# Patient Record
Sex: Female | Born: 1935 | Race: White | Hispanic: No | Smoking: Never smoker
Health system: Southern US, Community
[De-identification: ages and names within clinical notes are randomized; demographics above are authoritative.]

## PROBLEM LIST (undated history)

## (undated) DIAGNOSIS — D472 Monoclonal gammopathy: Secondary | ICD-10-CM

## (undated) DIAGNOSIS — I5032 Chronic diastolic (congestive) heart failure: Secondary | ICD-10-CM

## (undated) DIAGNOSIS — I2510 Atherosclerotic heart disease of native coronary artery without angina pectoris: Secondary | ICD-10-CM

## (undated) DIAGNOSIS — M1990 Unspecified osteoarthritis, unspecified site: Secondary | ICD-10-CM

## (undated) DIAGNOSIS — C8580 Other specified types of non-Hodgkin lymphoma, unspecified site: Secondary | ICD-10-CM

## (undated) DIAGNOSIS — I4821 Permanent atrial fibrillation: Secondary | ICD-10-CM

## (undated) DIAGNOSIS — I7090 Unspecified atherosclerosis: Secondary | ICD-10-CM

## (undated) DIAGNOSIS — C8300 Small cell B-cell lymphoma, unspecified site: Secondary | ICD-10-CM

## (undated) DIAGNOSIS — I10 Essential (primary) hypertension: Secondary | ICD-10-CM

## (undated) DIAGNOSIS — E785 Hyperlipidemia, unspecified: Secondary | ICD-10-CM

## (undated) DIAGNOSIS — N183 Chronic kidney disease, stage 3 unspecified: Secondary | ICD-10-CM

## (undated) DIAGNOSIS — M353 Polymyalgia rheumatica: Secondary | ICD-10-CM

## (undated) DIAGNOSIS — I509 Heart failure, unspecified: Secondary | ICD-10-CM

## (undated) DIAGNOSIS — N184 Chronic kidney disease, stage 4 (severe): Secondary | ICD-10-CM

## (undated) DIAGNOSIS — M109 Gout, unspecified: Secondary | ICD-10-CM

## (undated) DIAGNOSIS — L570 Actinic keratosis: Secondary | ICD-10-CM

## (undated) DIAGNOSIS — C4492 Squamous cell carcinoma of skin, unspecified: Secondary | ICD-10-CM

## (undated) DIAGNOSIS — F0390 Unspecified dementia without behavioral disturbance: Secondary | ICD-10-CM

## (undated) DIAGNOSIS — I4891 Unspecified atrial fibrillation: Secondary | ICD-10-CM

## (undated) DIAGNOSIS — R0600 Dyspnea, unspecified: Secondary | ICD-10-CM

## (undated) DIAGNOSIS — E039 Hypothyroidism, unspecified: Secondary | ICD-10-CM

## (undated) HISTORY — PX: CARDIAC CATHETERIZATION: SHX172

## (undated) HISTORY — DX: Actinic keratosis: L57.0

## (undated) HISTORY — DX: Squamous cell carcinoma of skin, unspecified: C44.92

## (undated) HISTORY — PX: COLONOSCOPY: SHX174

## (undated) HISTORY — PX: REPLACEMENT TOTAL KNEE: SUR1224

## (undated) HISTORY — PX: TUBAL LIGATION: SHX77

---

## 1976-09-22 HISTORY — PX: CHOLECYSTECTOMY: SHX55

## 2006-04-08 ENCOUNTER — Ambulatory Visit: Payer: Self-pay | Admitting: Internal Medicine

## 2006-04-20 ENCOUNTER — Inpatient Hospital Stay: Payer: Self-pay | Admitting: General Practice

## 2006-05-12 ENCOUNTER — Encounter: Payer: Self-pay | Admitting: General Practice

## 2006-05-23 ENCOUNTER — Encounter: Payer: Self-pay | Admitting: General Practice

## 2008-06-08 ENCOUNTER — Ambulatory Visit: Payer: Self-pay | Admitting: Internal Medicine

## 2008-06-14 ENCOUNTER — Ambulatory Visit: Payer: Self-pay | Admitting: Physician Assistant

## 2009-11-16 IMAGING — CR RIGHT TIBIA AND FIBULA - 2 VIEW
1 series · 2 of 2 positions shown · non-contrast
Comparison: none

REASON FOR EXAM: pain s/p fall
COMMENTS:

PROCEDURE:     MDR - MDR TIBIA AND FIBULA RT-LOW LEG  - June 08, 2008  [DATE]
RESULT:     The patient has a fracture of the lateral tibial plateau as
noted on the knee radiographs. No additional fractures of the RIGHT lower
leg are seen. No radiodense soft tissue foreign body is observed.

[Series 1: view not recorded · 0.17mm/px · 2 of 2 slices shown]
[im 1/2]
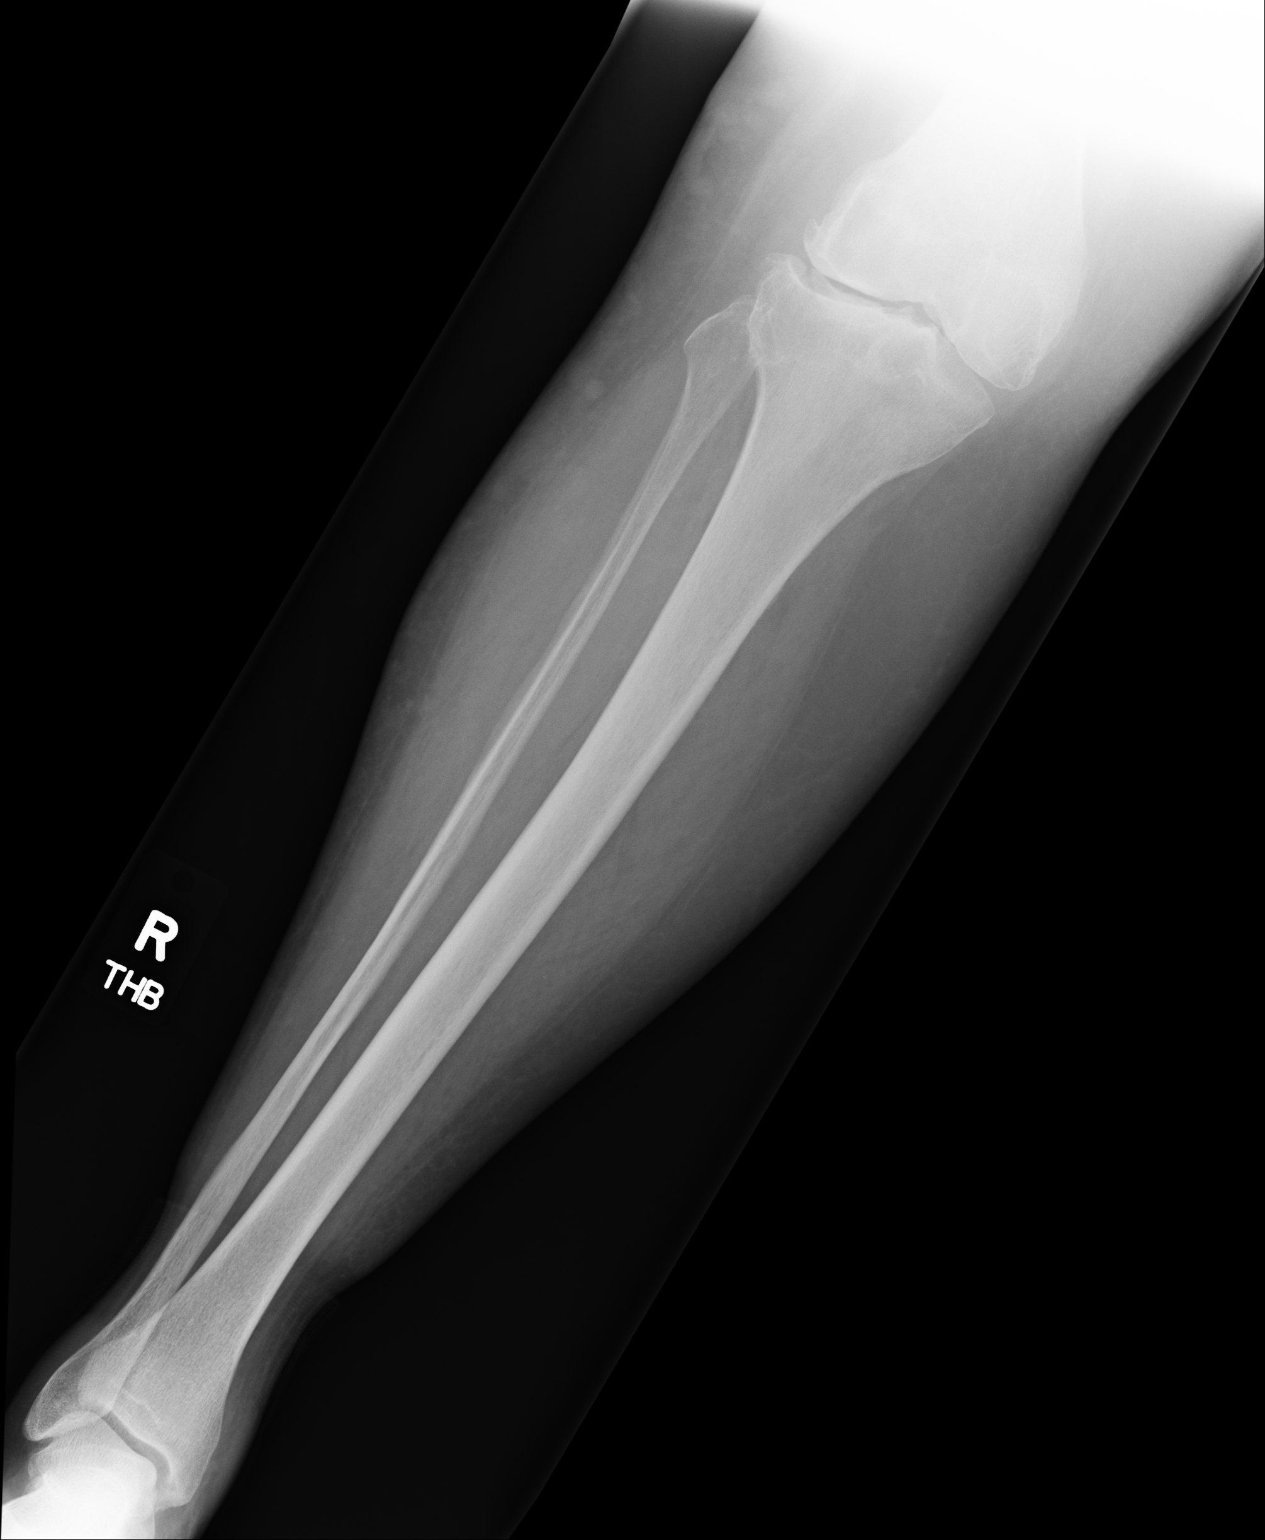
[im 2/2]
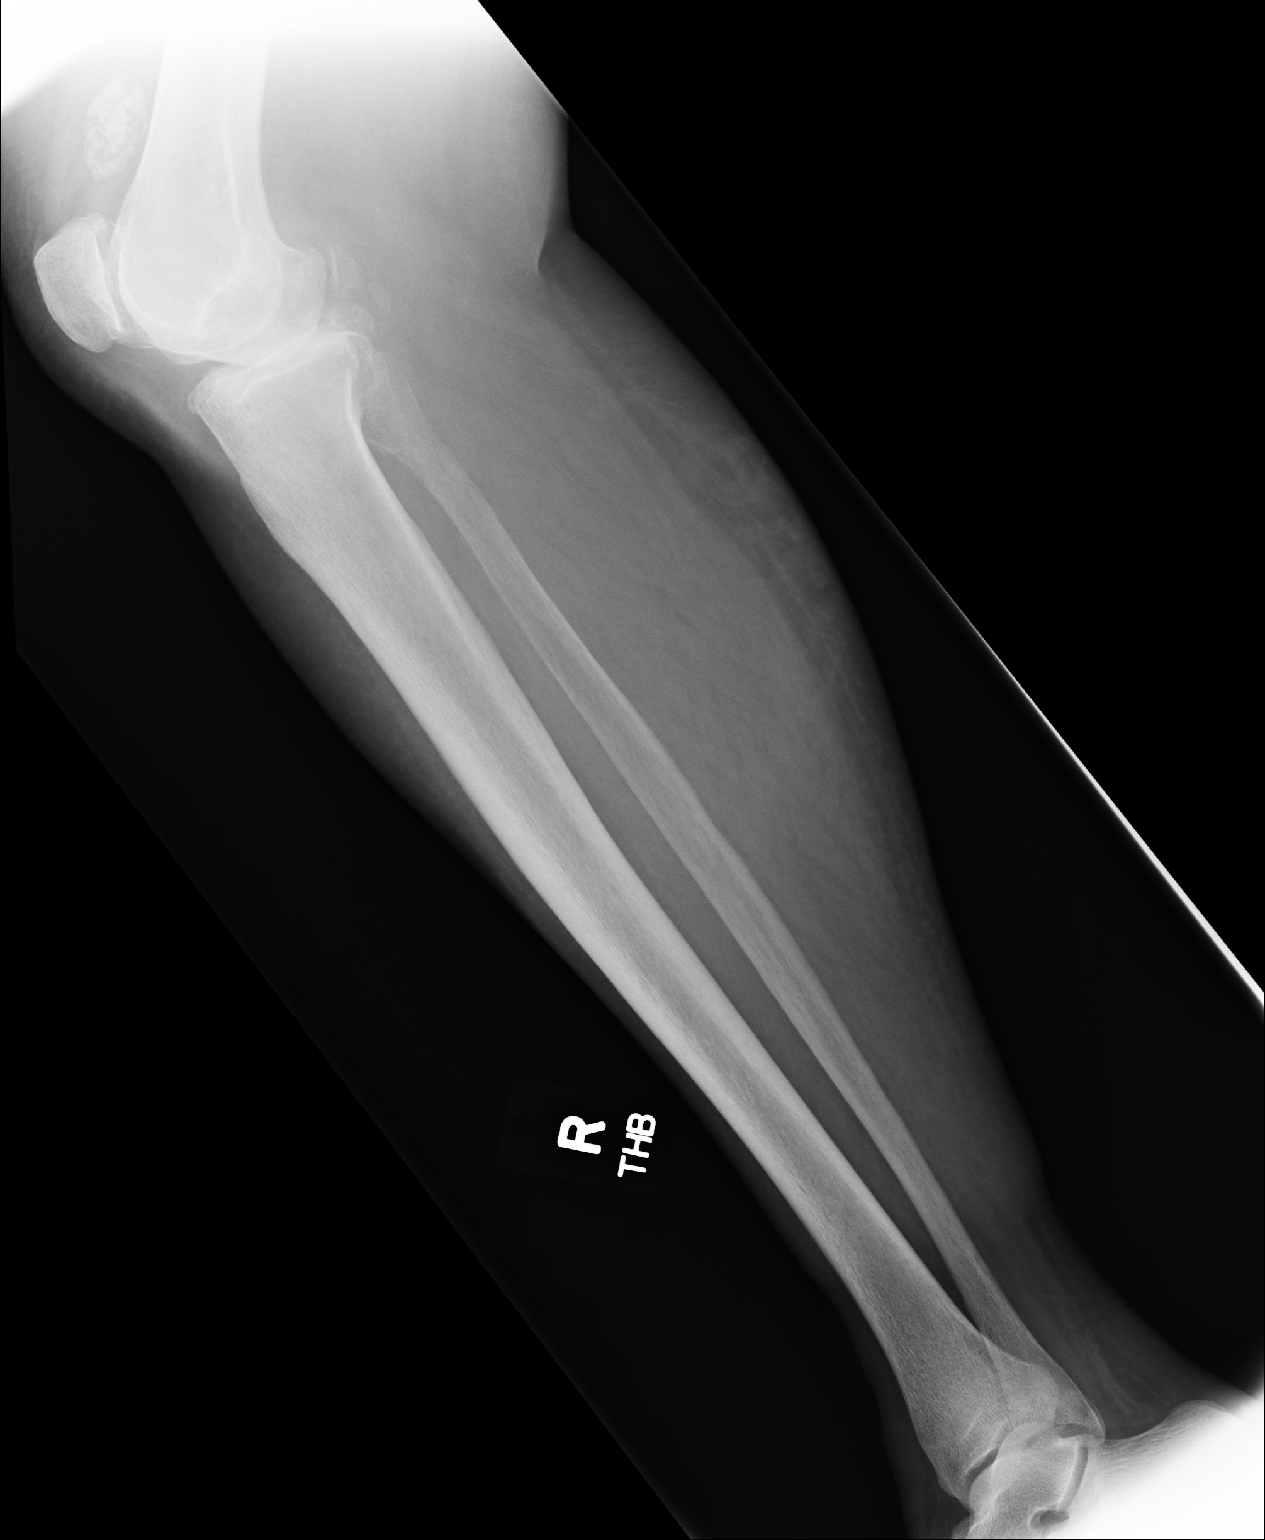

[2 of 2 positions shown; findings below may reference images not displayed]

IMPRESSION: 1.     Fracture of the lateral tibial plateau.

## 2009-11-16 IMAGING — CR DG KNEE COMPLETE 4+V*R*
1 series · 4 of 4 positions shown · non-contrast
Comparison: none

REASON FOR EXAM: pain s/p fall
COMMENTS:

[Series 1: view not recorded · 0.17mm/px · 4 of 4 slices shown]
[im 1/4]
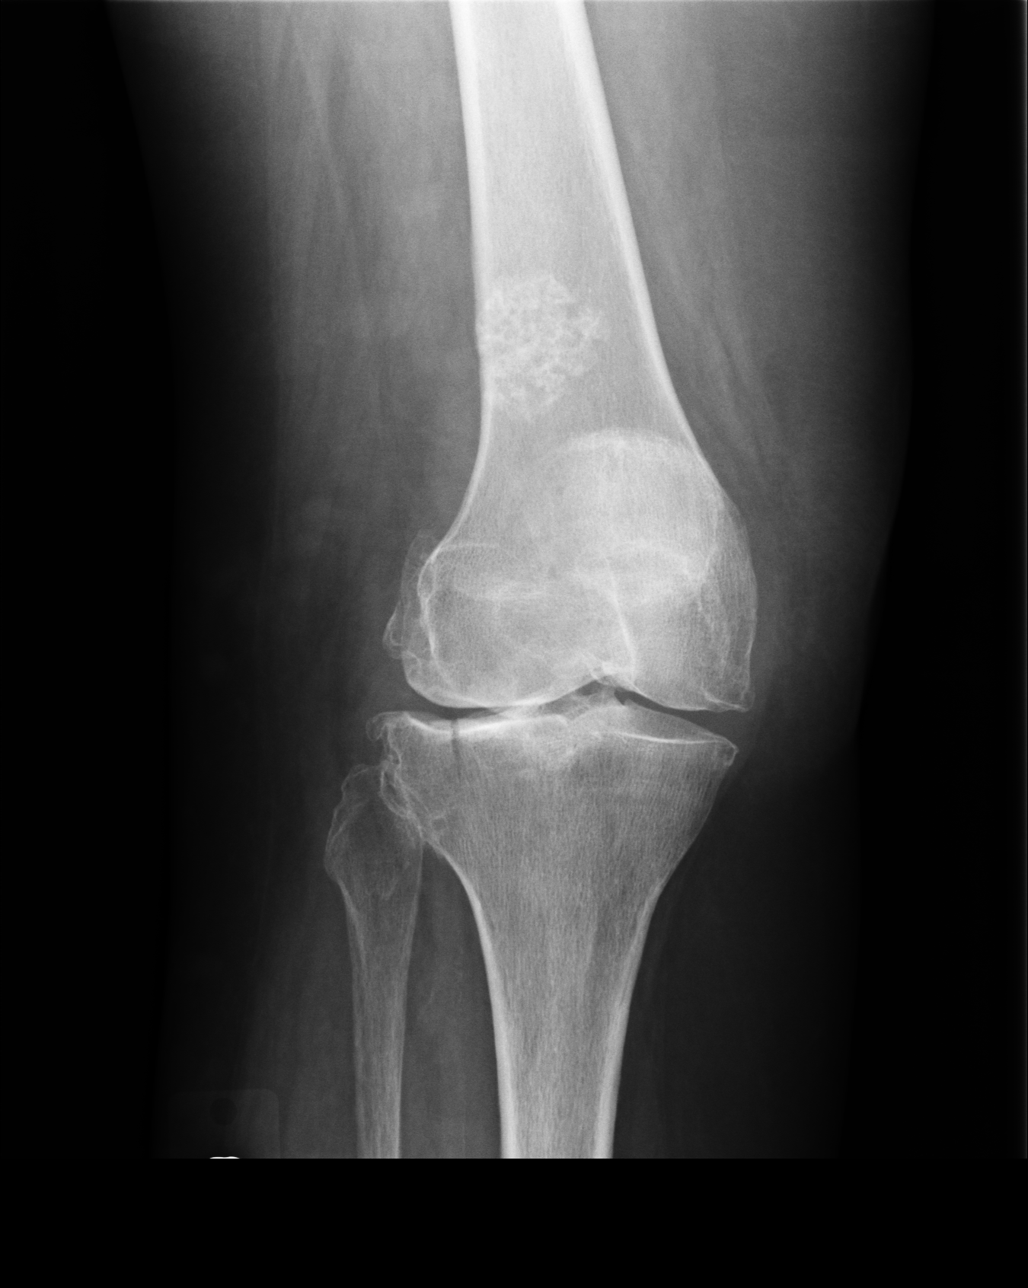
[im 2/4]
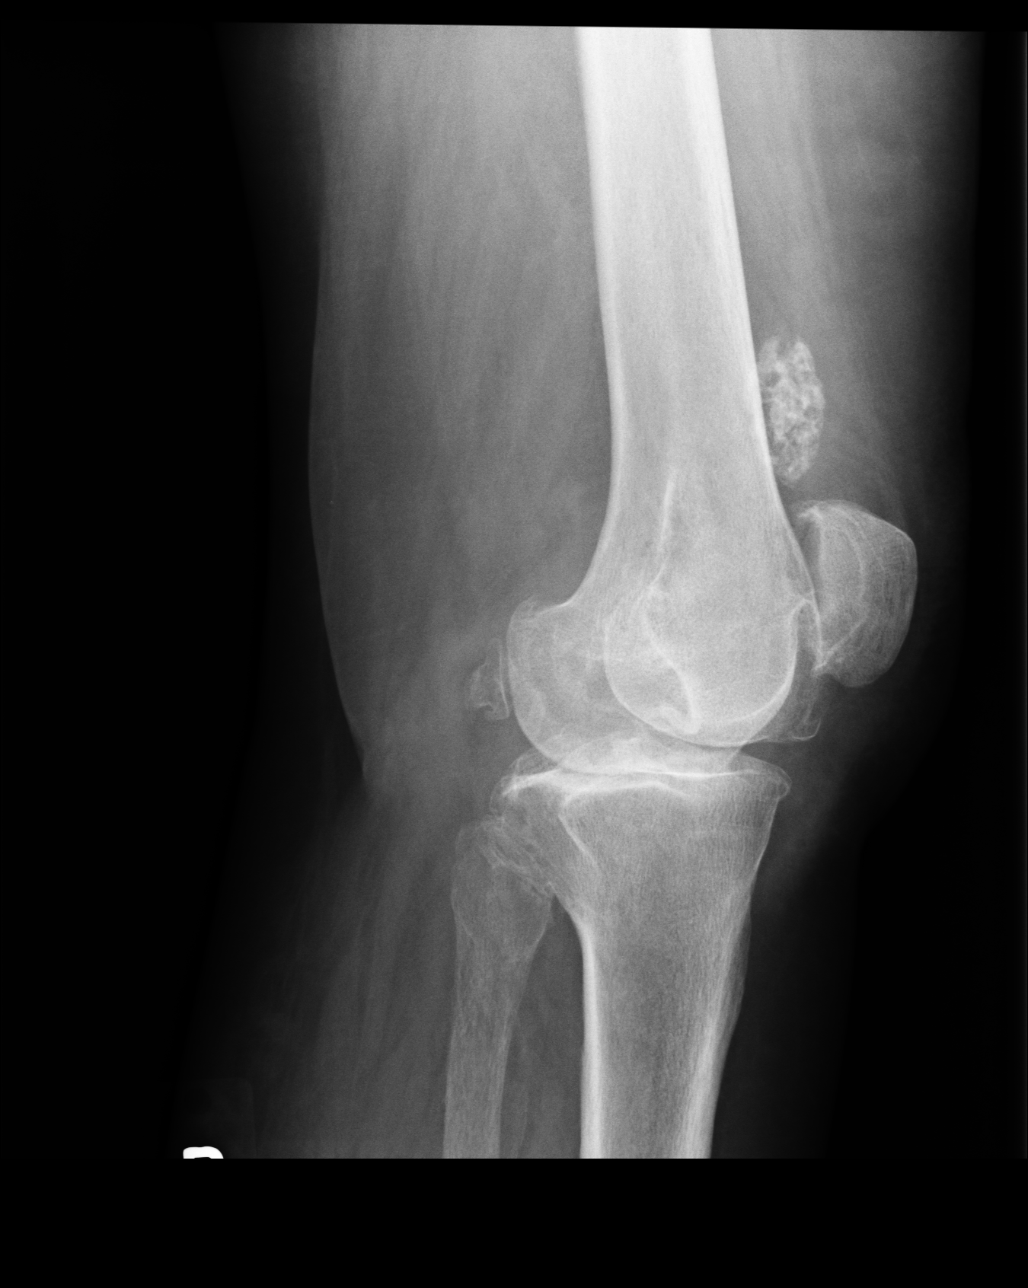
[im 3/4]
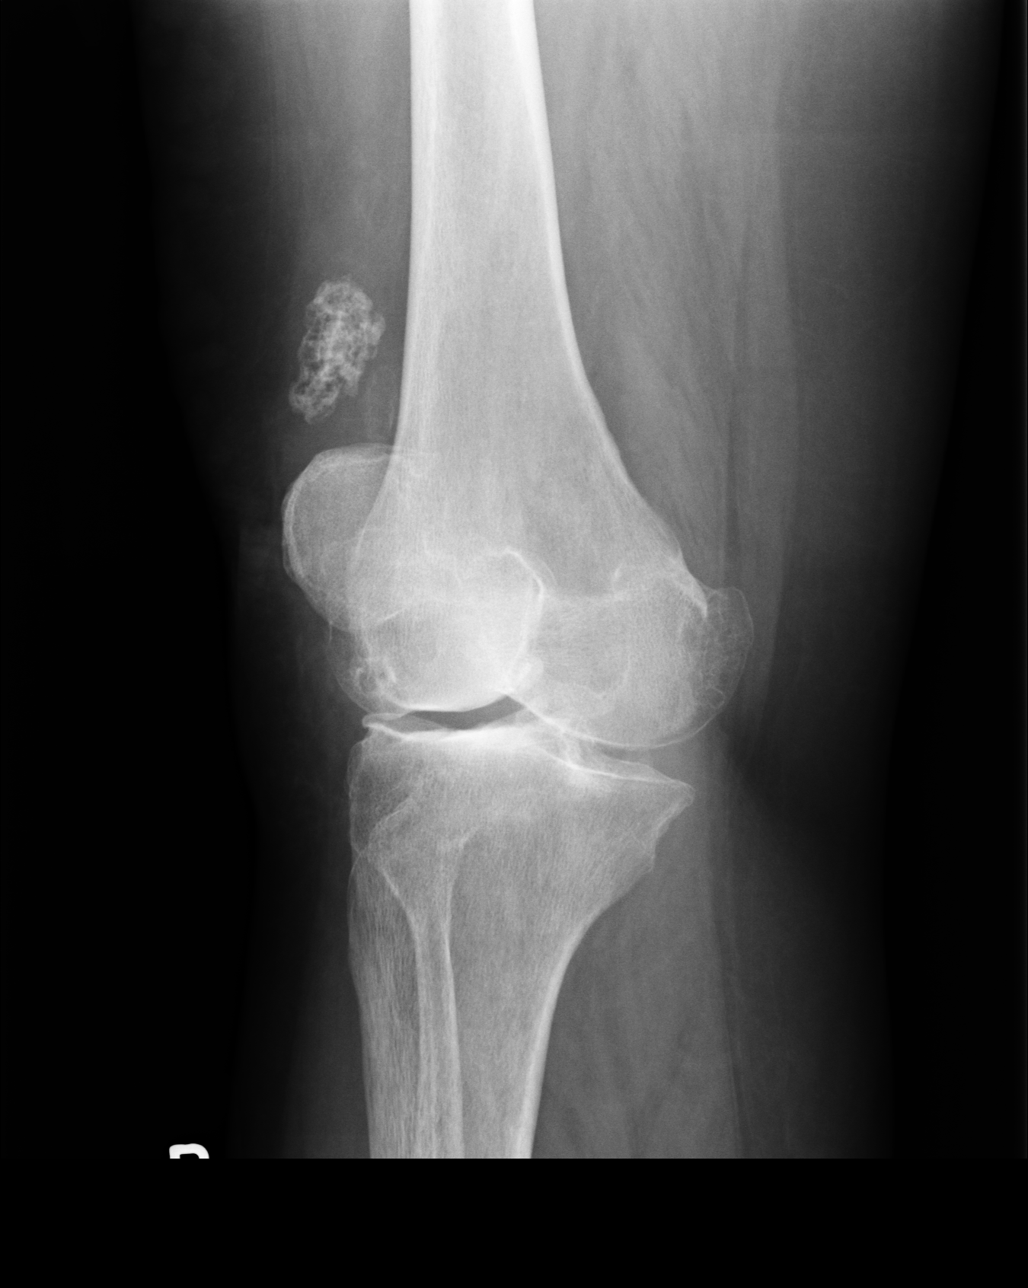
[im 4/4]
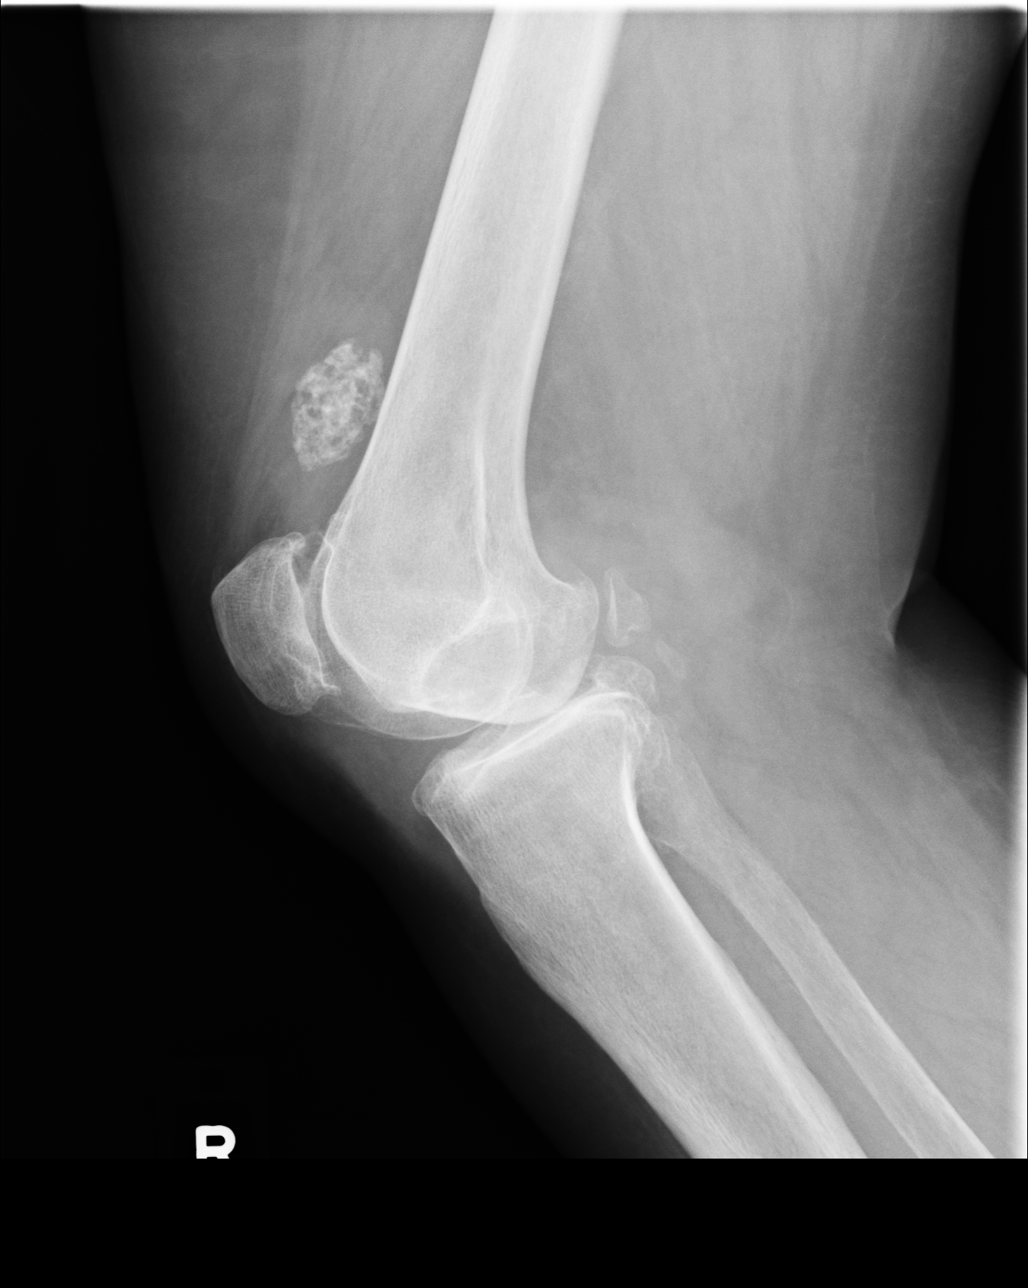

[4 of 4 positions shown; findings below may reference images not displayed]

PROCEDURE:     MDR - MDR KNEE RT COMPLETE W/OBLIQUES  - June 08, 2008  [DATE]

RESULT:     There is a minimally displaced vertical fracture involving the
lateral tibial plateau. No step-off along the articular plate is currently
seen. The fracture line is opened approximately 2 mm. On plain film
examination no definite depression is observed but this could be better
evaluated by CT if clinically indicated. No other fractures about the knee
are seen. Degenerative spurring is noted medially and laterally. Dorsal
patellar spurring is noted in the lateral view. There is noted calcification
superior to the patella and likely representing soft tissue calcification
from prior trauma.
IMPRESSION: 1. There is a fracture of the lateral tibial plateau.
2. Degenerative spurring is noted at the knee medially and laterally.
3. Dorsal patellar spurring is noted.
4. There is soft tissue calcification anterior to the distal shaft of the
femur. The etiology for this is not certain but is thought to likely
represent calcification secondary to residual change from prior trauma.
Synovial calcification could also produce this appearance but the
calcification appears somewhat high in position for synovial calcifications.

## 2010-12-27 ENCOUNTER — Ambulatory Visit: Payer: Self-pay | Admitting: Internal Medicine

## 2012-05-07 DIAGNOSIS — D472 Monoclonal gammopathy: Secondary | ICD-10-CM | POA: Insufficient documentation

## 2013-05-16 ENCOUNTER — Ambulatory Visit: Payer: Self-pay | Admitting: General Practice

## 2013-05-16 LAB — BASIC METABOLIC PANEL
BUN: 25 mg/dL — ABNORMAL HIGH (ref 7–18)
Calcium, Total: 9.4 mg/dL (ref 8.5–10.1)
Chloride: 105 mmol/L (ref 98–107)
Co2: 28 mmol/L (ref 21–32)
Creatinine: 1.15 mg/dL (ref 0.60–1.30)
EGFR (African American): 54 — ABNORMAL LOW
EGFR (Non-African Amer.): 46 — ABNORMAL LOW
Osmolality: 285 (ref 275–301)
Potassium: 3.2 mmol/L — ABNORMAL LOW (ref 3.5–5.1)
Sodium: 139 mmol/L (ref 136–145)

## 2013-05-16 LAB — CBC
HCT: 34.6 % — ABNORMAL LOW (ref 35.0–47.0)
MCHC: 33.5 g/dL (ref 32.0–36.0)
MCV: 91 fL (ref 80–100)
Platelet: 232 10*3/uL (ref 150–440)

## 2013-05-16 LAB — SEDIMENTATION RATE: Erythrocyte Sed Rate: 53 mm/hr — ABNORMAL HIGH (ref 0–30)

## 2013-05-16 LAB — URINALYSIS, COMPLETE
Bacteria: NONE SEEN
Leukocyte Esterase: NEGATIVE
Nitrite: NEGATIVE
Protein: NEGATIVE
WBC UR: 2 /HPF (ref 0–5)

## 2013-05-16 LAB — PROTIME-INR
INR: 1
Prothrombin Time: 13.5 secs (ref 11.5–14.7)

## 2013-05-16 LAB — MRSA PCR SCREENING

## 2013-06-01 ENCOUNTER — Inpatient Hospital Stay: Payer: Self-pay | Admitting: General Practice

## 2013-06-01 LAB — POTASSIUM: Potassium: 4.5 mmol/L (ref 3.5–5.1)

## 2013-06-02 LAB — BASIC METABOLIC PANEL
BUN: 21 mg/dL — ABNORMAL HIGH (ref 7–18)
Chloride: 106 mmol/L (ref 98–107)
Co2: 28 mmol/L (ref 21–32)
Creatinine: 1.03 mg/dL (ref 0.60–1.30)
EGFR (African American): >60
EGFR (Non-African Amer.): 53 — ABNORMAL LOW
Glucose: 120 mg/dL — ABNORMAL HIGH (ref 65–99)
Potassium: 4.7 mmol/L (ref 3.5–5.1)
Sodium: 138 mmol/L (ref 136–145)

## 2013-06-02 LAB — HEMOGLOBIN: HGB: 9.9 g/dL — ABNORMAL LOW (ref 12.0–16.0)

## 2013-06-02 LAB — PLATELET COUNT: Platelet: 147 10*3/uL — ABNORMAL LOW (ref 150–440)

## 2013-06-03 LAB — BASIC METABOLIC PANEL
BUN: 26 mg/dL — ABNORMAL HIGH (ref 7–18)
Chloride: 110 mmol/L — ABNORMAL HIGH (ref 98–107)
Creatinine: 1.26 mg/dL (ref 0.60–1.30)
Glucose: 108 mg/dL — ABNORMAL HIGH (ref 65–99)
Osmolality: 285 (ref 275–301)
Potassium: 4.3 mmol/L (ref 3.5–5.1)

## 2013-06-03 LAB — PLATELET COUNT: Platelet: 121 10*3/uL — ABNORMAL LOW (ref 150–440)

## 2013-06-04 LAB — HEMOGLOBIN: HGB: 8 g/dL — ABNORMAL LOW (ref 12.0–16.0)

## 2013-06-05 LAB — HEMOGLOBIN: HGB: 8.3 g/dL — ABNORMAL LOW (ref 12.0–16.0)

## 2014-07-12 ENCOUNTER — Ambulatory Visit: Payer: Self-pay | Admitting: Family Medicine

## 2014-07-12 ENCOUNTER — Ambulatory Visit: Payer: Self-pay | Admitting: Physician Assistant

## 2015-01-12 NOTE — Discharge Summary (Signed)
PATIENT NAME:  Penny White, FRACE MR#:  [id number] DATE OF BIRTH:  Mar 07, 1935  DATE OF ADMISSION:  06/01/2013 DATE OF DISCHARGE:  06/04/2013  ADMITTING DIAGNOSIS:  Degenerative arthrosis of the right knee.   DISCHARGE DIAGNOSIS:  Degenerative arthrosis of the right knee.  OPERATION: On 06/01/2013, the patient had a right total knee arthroplasty using computer-aided navigation.   SURGEON: Dr. Skip Estimable.  ASSISTANT:  April Berndt, NP.   ANESTHESIA: Spinal.   ESTIMATED BLOOD LOSS: 100 mL   TOURNIQUET:  107 minutes.   IMPLANTS USED: DePuy PFC Sigma size 2 posterior stabilized femoral component that was cemented, size 2 MBT tibial component that was cemented, 32 mm 3 peg oval dome patella that was cemented and a 12.5 mm stabilized rotating platform polyethylene insert.   The patient was brought to the recovery room after surgery and then brought down to the orthopedic floor.   HISTORY OF PRESENT ILLNESS: The patient is a 80 year old female who presented for upcoming right total knee arthroplasty to be done on June 01, 2013. The patient has been refractory to conservative treatment including cortisone injections, activities of daily living that have been limited.  She has had continued difficulty.   PHYSICAL EXAMINATION: GENERAL: Well-developed female with an antalgic gait on the right. Slow shuffling gait.  LUNGS: Clear to auscultation.  CARDIOVASCULAR: Irregular rate and rhythm. No murmurs are noted though.    MUSCULOSKELETAL: In regard to the right knee, the patient has crepitus with range of motion and has medial joint line tenderness. The patient has range of motion that is full extension to 112 degrees flexion.   HOSPITAL COURSE: After initial admission on 06/01/2013, the patient was brought to the orthopedic floor. The patient had a hemoglobin on postoperative day 1 of 9.9, which dropped down to 8.2 on postoperative day 2. That evening it was rechecked and it was back up to  8.8. The patient had received about 200 mL of Autovac. The patient did physical therapy, initially  bed to chair and progressed up to ambulating over 50 feet. On postoperative day 2 with 85 degrees of flexion with range of motion. The patient was to walk the nursing station before discharge. There were no other complications.   CONDITION AT DISCHARGE: Stable.   DISPOSITION: The patient was sent home with home health physical therapy.   DISCHARGE INSTRUCTIONS:  The patient is to follow-up with [facility] orthopedics on 06/13/2013, with Vance Peper. The patient will do weight bear as tolerated. The patient will elevate her leg with 1 to 2 pillows and try to keep her heel off the bed. The patient will use thigh-high TED hose on both legs, removed at nighttime. The patient's diet is regular. The patient will use Polar Care to decrease swelling. The patient will keep her dressing on, try not to get it wet. The patient will work with physical therapy doing gait training and range of motion activities. The patient will call the clinic if there is any bright red bleeding, calf pain, bowel or bladder difficulty, or any fever greater than 101.5.   DISCHARGE MEDICATIONS:  Resume home medications. Oxycodone 5 mg q. 4-6 hours p.r.n. for severe pain, tramadol 50 mg 1 to 2 tablets every 4 to 6 hours as needed for less severe pain, Lovenox 40 mg subcutaneous once a day for 14 days and Celebrex 200 mg 1 tablet twice a day for a month.    ____________________________ Lenna Sciara. Reche Dixon, [place] jtm:dp D: 06/04/2013 06:20:12 ET T: 06/04/2013  09:02:10 ET JOB#: [id number]  cc: J. Reche Dixon, [place], <Dictator> J Adelheid Hoggard [facility] PA ELECTRONICALLY SIGNED 06/07/2013 7:42

## 2015-01-12 NOTE — Discharge Summary (Signed)
PATIENT NAME:  Penny White, Penny White MR#:  [id number] DATE OF BIRTH:  03-16-1935  DATE OF ADMISSION:  06/01/2013 DATE OF DISCHARGE:  06/05/2013  ADDENDUM: Initial date of discharge was 06/04/2013, that was held until 06/05/2013, based on her low hemoglobin and vitals that were being evaluated. The patient had only ambulated 40 feet and was to go home. The patient did go home on 06/05/2013, after walking to the nurse's station, as well as had a bowel movement, and her hemoglobin went up to 8.3. The patient did well and was ready to go home that afternoon. There is no other changes in discharge instructions.   ____________________________ Lenna Sciara. Reche Dixon, [place] jtm:sg D: 06/07/2013 07:39:32 ET T: 06/07/2013 07:57:40 ET JOB#: [id number]  cc: J. Reche Dixon, [place], <Dictator> J Moncerrat Burnstein [facility] PA ELECTRONICALLY SIGNED 06/24/2013 7:47

## 2015-01-12 NOTE — Op Note (Signed)
PATIENT NAME:  Penny White, KOY MR#:  [id number] DATE OF BIRTH:  [birth year]  DATE OF PROCEDURE:  06/01/2013  PREOPERATIVE DIAGNOSIS:  Degenerative arthrosis of the right knee.   POSTOPERATIVE DIAGNOSIS:  Degenerative arthrosis of the right knee.   PROCEDURE PERFORMED:  Right total knee arthroplasty using computer-assisted navigation.   SURGEON:  Laurice Record. Marry Guan, M.D.   ASSISTANT:  April Berndt, NP (required to maintain retraction throughout the procedure).   ANESTHESIA:  Spinal.   ESTIMATED BLOOD LOSS:  100 mL.   FLUIDS REPLACED:  900 mL of crystalloid.   TOURNIQUET TIME:  107 minutes.   DRAINS:  Two medium drains to reinfusion system.   SOFT TISSUE RELEASES:  Anterior cruciate ligament, posterior cruciate ligament, deep medial collateral ligament, patellofemoral ligament, posterolateral corner and pie crusting of the iliotibial band.   IMPLANTS UTILIZED:  DePuy PFC Sigma size 2 posterior stabilized femoral component (cemented), size 2 MBT tibial component (cemented), 32 mm three peg oval dome patella (cemented) and a 12.5 mm stabilized rotating platform polyethylene insert.   INDICATIONS FOR SURGERY:  The patient is a 80 year old female who has been seen for complaints of persistent and progressive right knee pain.  X-rays demonstrated severe degenerative changes in tricompartmental fashion with valgus deformity.  After discussion of the risks and benefits of surgical intervention, the patient expressed understanding of the risks and benefits and agreed with plans for surgical intervention.   PROCEDURE IN DETAIL:  The patient was brought into the Operating Room and, after adequate spinal anesthesia was achieved, a tourniquet was placed on the patient's upper right thigh.  The patient's right knee and leg were cleaned and prepped with alcohol and DuraPrep, draped in the usual sterile fashion.  A "timeout" was performed as per usual protocol.  The right lower extremity was exsanguinated  using an Esmarch, the tourniquet was inflated to 300 mmHg.  Anterior longitudinal incision was made followed by a standard mid vastus approach.  A moderate effusion was evacuated.  Deep fibers of the medial collateral ligament were elevated in a subperiosteal fashion off the medial flare of the tibia so as to maintain a continuous soft tissue sleeve.  The patella was subluxed laterally and the patellofemoral ligament was incised.  Inspection of the knee demonstrated severe degenerative changes with full thickness loss of articular cartilage to the lateral compartment.  Prominent osteophytes were debrided using a rongeur.  Anterior and posterior cruciate ligaments were excised.  Two 4.0 mm Schanz pins were inserted into the femur and into the tibia for attachment of the array of trackers used for computer-assisted navigation.  Hip center was identified using the circumduction technique.  Distal landmarks were mapped using the computer.  The distal femur and proximal tibia were mapped using the computer.  Distal femoral cutting guide was positioned so as to achieve a 5 degree distal valgus cut.  Cut was performed and verified using the computer.  Distal femur was sized and it was felt that a size 2 femoral component was appropriate.  A size 2 cutting guide was positioned and the anterior cut was performed and verified using the computer.  This was followed by completion of the posterior and chamfer cuts.  Femoral cutting guide for the central box was then positioned and the central box cut was performed.  Attention was then directed to the proximal tibia.  Medial and lateral menisci were excised.  The extramedullary tibial cutting guide was positioned using computer-assisted navigation so as to achieve 0 degree varus  valgus alignment and 0 degree posterior slope.  Cut was performed and verified using the computer.  The proximal tibia was sized and it was felt that a size 2 tibial tray was appropriate.  Tibial and  femoral trials were inserted followed by insertion of a 10 mm polyethylene insert.  The knee was felt to be tight laterally.  Trial components were removed.  The knee was brought into extension and distracted using Moreland retractors.  The posterolateral corner was carefully released using a combination of electrocautery and Metzenbaum scissors.  The knee was still felt to be tight laterally and the IT band was "pie crusted" using multiple stab incisions.  Trial components were reinserted with first a 10 and subsequently a 12.5 mm polyethylene insert.  Excellent mediolateral soft tissue balance was appreciated both in full extension and in flexion.  Finally, the patella was cut and prepared so as to accommodate a 32 mm three peg oval dome patella.  Patellar trial was placed and the knee was placed through a range of motion with excellent patellar tracking appreciated.    Femoral trial was removed.  Central post hole for the tibial component was reamed followed by insertion of a keel punch.  Tibial trials were then removed.  The cut surfaces of bone were irrigated with copious amounts of normal saline with antibiotic solution using pulsatile lavage and then suctioned dry.  Polymethyl methacrylate cement with gentamicin was prepared in the usual fashion using a vacuum mixer.  Cement was applied to cut surfaces of the proximal tibia as well as along the undersurface of size 2 MBT tibial component.  The tibial component was positioned and impacted into place.  Excess cement was removed using freer elevators.  Cement was then applied to the cut surfaces of the femur as well as along the posterior flanges of a size 2 posterior stabilized femoral component.  Femoral component was positioned and impacted into place.  Excess cement was removed using freer elevators.  A 12.5 mm polyethylene trial was inserted and the knee was brought in full extension with steady axial compression applied.  Finally, cement was applied to  the backside of a 32 mm three peg oval dome patella and the patellar component was positioned and patellar clamp applied.  Excess cement was removed using freer elevators.   After adequate curing of cement, the tourniquet was deflated after total tourniquet time of 107 minutes.  Hemostasis was achieved using electrocautery.  The knee was irrigated with copious amounts of normal saline with antibiotic solution using pulsatile lavage and then suctioned dry.  The knee was inspected for any residual cement debris.  20 mL of 1.3% Exparel in 40 mL of normal saline was injected along the posterior capsule, medial and lateral recesses and along the arthrotomy site.  A 12.5 mm stabilized rotating platform polyethylene insert was inserted.  The knee was placed through range of motion.  Excellent patellar tracking was appreciated and excellent mediolateral soft tissue balancing was appreciated.  Two medium drains were placed in the wound bed and brought out through a separate stab incision to be attached to a reinfusion system.  The medial parapatellar portion of the incision was reapproximated using interrupted sutures of #1 Vicryl.  The subcutaneous tissue was approximated in layers using first #0 Vicryl followed by #2-0 Vicryl.  A total of 30 mL of 0.25% Marcaine with epinephrine was injected into the subcutaneous tissue along the incision site.  Skin edges were then reapproximated using skin staples.  A sterile dressing was applied.  The patient tolerated the procedure well.  She was transported to the recovery room in stable condition.    ____________________________ Laurice Record. Holley Bouche., MD jph:ea D: 06/02/2013 06:13:23 ET T: 06/02/2013 06:41:03 ET JOB#: [id number]  cc: Laurice Record. Holley Bouche., MD, <Dictator> JAMES P Holley Bouche MD ELECTRONICALLY SIGNED 06/05/2013 22:24

## 2015-06-25 DIAGNOSIS — I5030 Unspecified diastolic (congestive) heart failure: Secondary | ICD-10-CM | POA: Insufficient documentation

## 2016-09-22 DIAGNOSIS — I639 Cerebral infarction, unspecified: Secondary | ICD-10-CM

## 2016-09-22 HISTORY — DX: Cerebral infarction, unspecified: I63.9

## 2016-09-27 DIAGNOSIS — Z8673 Personal history of transient ischemic attack (TIA), and cerebral infarction without residual deficits: Secondary | ICD-10-CM

## 2017-01-22 DIAGNOSIS — I482 Chronic atrial fibrillation, unspecified: Secondary | ICD-10-CM | POA: Insufficient documentation

## 2018-09-22 DIAGNOSIS — G0490 Encephalitis and encephalomyelitis, unspecified: Secondary | ICD-10-CM

## 2018-09-22 DIAGNOSIS — B029 Zoster without complications: Secondary | ICD-10-CM

## 2018-09-22 DIAGNOSIS — I6200 Nontraumatic subdural hemorrhage, unspecified: Secondary | ICD-10-CM

## 2018-09-22 HISTORY — DX: Encephalitis and encephalomyelitis, unspecified: G04.90

## 2018-09-22 HISTORY — DX: Nontraumatic subdural hemorrhage, unspecified: I62.00

## 2018-09-22 HISTORY — DX: Zoster without complications: B02.9

## 2019-12-28 ENCOUNTER — Emergency Department
Admission: EM | Admit: 2019-12-28 | Discharge: 2019-12-28 | Disposition: A | Discharge status: Skilled Nursing Facility | Payer: Medicare HMO | Attending: Emergency Medicine | Admitting: Emergency Medicine

## 2019-12-28 ENCOUNTER — Emergency Department: Payer: Medicare HMO

## 2019-12-28 ENCOUNTER — Other Ambulatory Visit: Payer: Self-pay

## 2019-12-28 DIAGNOSIS — W19XXXA Unspecified fall, initial encounter: Secondary | ICD-10-CM

## 2019-12-28 DIAGNOSIS — N189 Chronic kidney disease, unspecified: Secondary | ICD-10-CM | POA: Diagnosis not present

## 2019-12-28 DIAGNOSIS — S022XXA Fracture of nasal bones, initial encounter for closed fracture: Secondary | ICD-10-CM | POA: Diagnosis not present

## 2019-12-28 DIAGNOSIS — Y9389 Activity, other specified: Secondary | ICD-10-CM | POA: Insufficient documentation

## 2019-12-28 DIAGNOSIS — S0990XA Unspecified injury of head, initial encounter: Secondary | ICD-10-CM

## 2019-12-28 DIAGNOSIS — Y999 Unspecified external cause status: Secondary | ICD-10-CM | POA: Diagnosis not present

## 2019-12-28 DIAGNOSIS — I129 Hypertensive chronic kidney disease with stage 1 through stage 4 chronic kidney disease, or unspecified chronic kidney disease: Secondary | ICD-10-CM | POA: Insufficient documentation

## 2019-12-28 DIAGNOSIS — W01198A Fall on same level from slipping, tripping and stumbling with subsequent striking against other object, initial encounter: Secondary | ICD-10-CM | POA: Diagnosis not present

## 2019-12-28 DIAGNOSIS — Y9289 Other specified places as the place of occurrence of the external cause: Secondary | ICD-10-CM | POA: Insufficient documentation

## 2019-12-28 DIAGNOSIS — S0181XA Laceration without foreign body of other part of head, initial encounter: Secondary | ICD-10-CM | POA: Diagnosis not present

## 2019-12-28 HISTORY — DX: Small cell B-cell lymphoma, unspecified site: C83.00

## 2019-12-28 HISTORY — DX: Heart failure, unspecified: I50.9

## 2019-12-28 HISTORY — DX: Hypothyroidism, unspecified: E03.9

## 2019-12-28 HISTORY — DX: Hyperlipidemia, unspecified: E78.5

## 2019-12-28 HISTORY — DX: Gout, unspecified: M10.9

## 2019-12-28 HISTORY — DX: Unspecified atherosclerosis: I70.90

## 2019-12-28 HISTORY — DX: Chronic kidney disease, stage 3 unspecified: N18.30

## 2019-12-28 HISTORY — DX: Unspecified dementia, unspecified severity, without behavioral disturbance, psychotic disturbance, mood disturbance, and anxiety: F03.90

## 2019-12-28 HISTORY — DX: Unspecified osteoarthritis, unspecified site: M19.90

## 2019-12-28 HISTORY — DX: Unspecified atrial fibrillation: I48.91

## 2019-12-28 MED ORDER — MUPIROCIN 2 % EX OINT: TOPICAL_OINTMENT | CUTANEOUS | 0 refills | Status: AC

## 2019-12-28 MED ORDER — ACETAMINOPHEN 500 MG PO TABS
1000.0000 mg | ORAL_TABLET | Freq: Once | ORAL | Status: AC
Start: 2019-12-28 — End: 2019-12-28
  Administered 2019-12-28: 1000 mg via ORAL
  Filled 2019-12-28: qty 2

## 2019-12-28 NOTE — ED Provider Notes (Signed)
[facility] Emergency Department Provider Note       Time seen: ----------------------------------------- 11:09 AM on 12/28/2019 -----------------------------------------   I have reviewed the triage vital signs and the nursing notes.  HISTORY   Chief Complaint No chief complaint on file.    HPI Penny White is a 85 y.o. female with a history of chronic atrial fibrillation, hyperlipidemia, hypertension, chronic kidney disease, CVA who presents to the ED for an unwitnessed fall.  Patient reports she tripped over her walker and hit her face.  She was noted to have a laceration over the bridge of her nose.  Discomfort is 3 out of 10 in her head.  No past medical history on file.  There are no problems to display for this patient.  Allergies Patient has no allergy information on record.  Social History Social History   Tobacco Use  . Smoking status: Not on file  Substance Use Topics  . Alcohol use: Not on file  . Drug use: Not on file    Review of Systems Constitutional: Negative for fever. Cardiovascular: Negative for chest pain. Respiratory: Negative for shortness of breath. Gastrointestinal: Negative for abdominal pain, vomiting and diarrhea. Musculoskeletal: Negative for back pain. Skin: Positive for laceration Neurological: Positive for head and facial pain  All systems negative/normal/unremarkable except as stated in the HPI  ____________________________________________   PHYSICAL EXAM:  VITAL SIGNS: ED Triage Vitals  Enc Vitals Group     BP      Pulse      Resp      Temp      Temp src      SpO2      Weight      Height      Head Circumference      Peak Flow      Pain Score      Pain Loc      Pain Edu?      Excl. in [place]?     Constitutional: Alert, no obvious distress Eyes: Conjunctivae are normal. Normal extraocular movements. ENT      Head: Normocephalic, obvious right supraorbital hematoma      Nose: Widened  nose with ecchymosis and 2.5 cm laceration over the bridge of her nose      Mouth/Throat: Mucous membranes are moist.      Neck: No stridor. Cardiovascular: Normal rate, regular rhythm. No murmurs, rubs, or gallops. Respiratory: Normal respiratory effort without tachypnea nor retractions. Breath sounds are clear and equal bilaterally. No wheezes/rales/rhonchi. Gastrointestinal: Soft and nontender. Normal bowel sounds Musculoskeletal: Nontender with normal range of motion in extremities. No lower extremity tenderness nor edema. Neurologic:  Normal speech and language. No gross focal neurologic deficits are appreciated.  Skin: Laceration of her nose as noted above Psychiatric: Mood and affect are normal. Speech and behavior are normal.  ____________________________________________  ED COURSE:  As part of my medical decision making, I reviewed the following data within the [place] History obtained from family if available, nursing notes, old chart and ekg, as well as notes from prior ED visits. Patient presented for mechanical fall, we will assess with labs and imaging as indicated at this time.   Marland Kitchen.Laceration Repair  Date/Time: 12/28/2019 11:34 AM Performed by: Earleen Newport, MD Authorized by: Earleen Newport, MD   Consent:    Consent obtained:  Verbal   Consent given by:  Patient Anesthesia (see MAR for exact dosages):    Anesthesia method:  Local infiltration  Local anesthetic:  Lidocaine 1% w/o epi Laceration details:    Location:  Face   Face location:  Nose   Length (cm):  2.5   Depth (mm):  5 Repair type:    Repair type:  Intermediate Pre-procedure details:    Preparation:  Patient was prepped and draped in usual sterile fashion Exploration:    Contaminated: no   Treatment:    Area cleansed with:  Betadine   Amount of cleaning:  Standard   Irrigation solution:  Sterile saline Skin repair:    Repair method:  Sutures   Suture size:  6-0    Suture material:  Prolene   Suture technique:  Running   Number of sutures:  8 Approximation:    Approximation:  Close Post-procedure details:    Dressing:  Open (no dressing)   Patient tolerance of procedure:  Tolerated well, no immediate complications    [name] was evaluated in Emergency Department on 12/28/2019 for the symptoms described in the history of present illness. She was evaluated in the context of the global COVID-19 pandemic, which necessitated consideration that the patient might be at risk for infection with the SARS-CoV-2 virus that causes COVID-19. Institutional protocols and algorithms that pertain to the evaluation of patients at risk for COVID-19 are in a state of rapid change based on information released by regulatory bodies including the CDC and federal and state organizations. These policies and algorithms were followed during the patient's care in the ED.  ____________________________________________   RADIOLOGY Images were viewed by me  CT head, maxillofacial, C-spine IMPRESSION:  Soft tissue contusions on the forehead and about the nose. The  patient has a mildly depressed fracture of the distal aspect of the  nasal bones. No other acute abnormality head, face or cervical  spine.   Remote left PCA territory infarct and chronic microvascular ischemic  change.   Cervical degenerative disease.  ____________________________________________   DIFFERENTIAL DIAGNOSIS   Fall, contusion, fracture, laceration, subdural  FINAL ASSESSMENT AND PLAN  Fall, nasal fracture, laceration   Plan: The patient had presented for a fall with resulting head injury. Patient's imaging revealed a mildly depressed distal nasal fracture.  CT revealed no intracranial or cervical acute process.  She will be advised to follow-up in 7 days for suture removal.   Laurence Aly, MD    Note: This note was generated in part or whole with voice recognition software.  Voice recognition is usually quite accurate but there are transcription errors that can and very often do occur. I apologize for any typographical errors that were not detected and corrected.     Earleen Newport, MD 12/28/19 1252

## 2019-12-28 NOTE — ED Notes (Signed)
ED Provider at bedside. 

## 2019-12-28 NOTE — ED Triage Notes (Signed)
Pt here via [facility] from [facility] due to an unwitnessed fall today. Pt reports falling over her walker. Per facility pt's nose looks "shorter than usual". Pt has skin tear present on face and right wrist. Pt does take a blood thinner.   EMS VSS- BP 160/70, HR 70's.

## 2019-12-28 NOTE — ED Notes (Signed)
Pt taken to CT.

## 2020-06-18 DIAGNOSIS — N184 Chronic kidney disease, stage 4 (severe): Secondary | ICD-10-CM | POA: Insufficient documentation

## 2020-06-19 ENCOUNTER — Other Ambulatory Visit: Payer: Self-pay | Admitting: Nephrology

## 2020-06-19 DIAGNOSIS — N184 Chronic kidney disease, stage 4 (severe): Secondary | ICD-10-CM

## 2020-06-19 DIAGNOSIS — R809 Proteinuria, unspecified: Secondary | ICD-10-CM

## 2020-06-19 DIAGNOSIS — R319 Hematuria, unspecified: Secondary | ICD-10-CM

## 2020-06-29 ENCOUNTER — Ambulatory Visit
Admission: RE | Admit: 2020-06-29 | Discharge: 2020-06-29 | Disposition: A | Discharge status: Home / Self Care | Payer: Medicare HMO | Source: Ambulatory Visit | Attending: Nephrology | Admitting: Nephrology

## 2020-06-29 ENCOUNTER — Other Ambulatory Visit: Payer: Self-pay

## 2020-06-29 DIAGNOSIS — N184 Chronic kidney disease, stage 4 (severe): Secondary | ICD-10-CM | POA: Diagnosis present

## 2020-06-29 DIAGNOSIS — R319 Hematuria, unspecified: Secondary | ICD-10-CM

## 2020-06-29 DIAGNOSIS — R809 Proteinuria, unspecified: Secondary | ICD-10-CM | POA: Insufficient documentation

## 2020-07-16 DIAGNOSIS — D631 Anemia in chronic kidney disease: Secondary | ICD-10-CM | POA: Diagnosis present

## 2020-11-27 ENCOUNTER — Encounter: Payer: Self-pay | Admitting: Ophthalmology

## 2020-11-27 ENCOUNTER — Other Ambulatory Visit: Payer: Self-pay

## 2020-11-29 NOTE — Discharge Instructions (Signed)

## 2020-11-30 ENCOUNTER — Other Ambulatory Visit: Payer: Self-pay

## 2020-11-30 ENCOUNTER — Other Ambulatory Visit
Admission: RE | Admit: 2020-11-30 | Discharge: 2020-11-30 | Disposition: A | Discharge status: Home / Self Care | Payer: Medicare HMO | Source: Ambulatory Visit | Attending: Ophthalmology | Admitting: Ophthalmology

## 2020-11-30 DIAGNOSIS — Z20822 Contact with and (suspected) exposure to covid-19: Secondary | ICD-10-CM | POA: Diagnosis not present

## 2020-11-30 DIAGNOSIS — Z01812 Encounter for preprocedural laboratory examination: Secondary | ICD-10-CM | POA: Diagnosis present

## 2020-11-30 LAB — SARS CORONAVIRUS 2 (TAT 6-24 HRS): SARS Coronavirus 2: NEGATIVE

## 2020-12-04 ENCOUNTER — Encounter: Payer: Self-pay | Admitting: Ophthalmology

## 2020-12-04 ENCOUNTER — Ambulatory Visit: Payer: Medicare HMO | Admitting: Anesthesiology

## 2020-12-04 ENCOUNTER — Ambulatory Visit
Admission: RE | Admit: 2020-12-04 | Discharge: 2020-12-04 | Disposition: A | Discharge status: Home / Self Care | Payer: Medicare HMO | Attending: Ophthalmology | Admitting: Ophthalmology

## 2020-12-04 ENCOUNTER — Other Ambulatory Visit: Payer: Self-pay

## 2020-12-04 ENCOUNTER — Encounter
Admission: RE | Disposition: A | Discharge status: Home / Self Care | Payer: Self-pay | Source: Home / Self Care | Attending: Ophthalmology

## 2020-12-04 DIAGNOSIS — Z884 Allergy status to anesthetic agent status: Secondary | ICD-10-CM | POA: Insufficient documentation

## 2020-12-04 DIAGNOSIS — N184 Chronic kidney disease, stage 4 (severe): Secondary | ICD-10-CM | POA: Insufficient documentation

## 2020-12-04 DIAGNOSIS — Z7952 Long term (current) use of systemic steroids: Secondary | ICD-10-CM | POA: Diagnosis not present

## 2020-12-04 DIAGNOSIS — I509 Heart failure, unspecified: Secondary | ICD-10-CM | POA: Insufficient documentation

## 2020-12-04 DIAGNOSIS — Z7901 Long term (current) use of anticoagulants: Secondary | ICD-10-CM | POA: Diagnosis not present

## 2020-12-04 DIAGNOSIS — Z8572 Personal history of non-Hodgkin lymphomas: Secondary | ICD-10-CM | POA: Insufficient documentation

## 2020-12-04 DIAGNOSIS — H2512 Age-related nuclear cataract, left eye: Secondary | ICD-10-CM | POA: Diagnosis not present

## 2020-12-04 DIAGNOSIS — I130 Hypertensive heart and chronic kidney disease with heart failure and stage 1 through stage 4 chronic kidney disease, or unspecified chronic kidney disease: Secondary | ICD-10-CM | POA: Insufficient documentation

## 2020-12-04 DIAGNOSIS — Z8673 Personal history of transient ischemic attack (TIA), and cerebral infarction without residual deficits: Secondary | ICD-10-CM | POA: Insufficient documentation

## 2020-12-04 HISTORY — PX: CATARACT EXTRACTION W/PHACO: SHX586

## 2020-12-04 HISTORY — DX: Essential (primary) hypertension: I10

## 2020-12-04 SURGERY — PHACOEMULSIFICATION, CATARACT, WITH IOL INSERTION
Anesthesia: Monitor Anesthesia Care | Site: Eye | Laterality: Left

## 2020-12-04 MED ORDER — BRIMONIDINE TARTRATE-TIMOLOL 0.2-0.5 % OP SOLN
OPHTHALMIC | Status: DC | PRN
  Administered 2020-12-04: 1 [drp] via OPHTHALMIC

## 2020-12-04 MED ORDER — ACETAMINOPHEN 325 MG PO TABS: 325.0000 mg | ORAL_TABLET | ORAL | Status: DC | PRN

## 2020-12-04 MED ORDER — MOXIFLOXACIN HCL 0.5 % OP SOLN
OPHTHALMIC | Status: DC | PRN
  Administered 2020-12-04: 0.2 mL via OPHTHALMIC

## 2020-12-04 MED ORDER — ARMC OPHTHALMIC DILATING DROPS
1.0000 "application " | OPHTHALMIC | Status: DC | PRN
  Administered 2020-12-04 (×3): 1 via OPHTHALMIC

## 2020-12-04 MED ORDER — ACETAMINOPHEN 160 MG/5ML PO SOLN: 325.0000 mg | ORAL | Status: DC | PRN

## 2020-12-04 MED ORDER — NA CHONDROIT SULF-NA HYALURON 40-17 MG/ML IO SOLN
INTRAOCULAR | Status: DC | PRN
  Administered 2020-12-04: 1 mL via INTRAOCULAR

## 2020-12-04 MED ORDER — FENTANYL CITRATE (PF) 100 MCG/2ML IJ SOLN
INTRAMUSCULAR | Status: DC | PRN
  Administered 2020-12-04: 25 ug via INTRAVENOUS

## 2020-12-04 MED ORDER — LIDOCAINE HCL (PF) 2 % IJ SOLN
INTRAOCULAR | Status: DC | PRN
  Administered 2020-12-04: 2 mL

## 2020-12-04 MED ORDER — TETRACAINE HCL 0.5 % OP SOLN
1.0000 [drp] | OPHTHALMIC | Status: DC | PRN
  Administered 2020-12-04 (×3): 1 [drp] via OPHTHALMIC

## 2020-12-04 MED ORDER — ONDANSETRON HCL 4 MG/2ML IJ SOLN: 4.0000 mg | Freq: Once | INTRAMUSCULAR | Status: DC | PRN

## 2020-12-04 MED ORDER — EPINEPHRINE PF 1 MG/ML IJ SOLN
INTRAOCULAR | Status: DC | PRN
  Administered 2020-12-04: 55 mL via OPHTHALMIC

## 2020-12-04 SURGICAL SUPPLY — 17 items
CANNULA ANT/CHMB 27G (MISCELLANEOUS) ×2 IMPLANT
CANNULA ANT/CHMB 27GA (MISCELLANEOUS) ×4 IMPLANT
GLOVE SURG TRIUMPH 8.0 PF LTX (GLOVE) ×3 IMPLANT
GOWN STRL REUS W/ TWL LRG LVL3 (GOWN DISPOSABLE) ×2 IMPLANT
GOWN STRL REUS W/TWL LRG LVL3 (GOWN DISPOSABLE) ×4
LENS IOL TECNIS EYHANCE 22.0 (Intraocular Lens) ×1 IMPLANT
MARKER SKIN DUAL TIP RULER LAB (MISCELLANEOUS) ×2 IMPLANT
NDL FILTER BLUNT 18X1 1/2 (NEEDLE) ×1 IMPLANT
NEEDLE FILTER BLUNT 18X 1/2SAF (NEEDLE) ×1
NEEDLE FILTER BLUNT 18X1 1/2 (NEEDLE) ×1 IMPLANT
PACK EYE AFTER SURG (MISCELLANEOUS) ×2 IMPLANT
PACK OPTHALMIC (MISCELLANEOUS) ×2 IMPLANT
PACK PORFILIO (MISCELLANEOUS) ×2 IMPLANT
SYR 3ML LL SCALE MARK (SYRINGE) ×2 IMPLANT
SYR TB 1ML LUER SLIP (SYRINGE) ×2 IMPLANT
WATER STERILE IRR 250ML POUR (IV SOLUTION) ×2 IMPLANT
WIPE NON LINTING 3.25X3.25 (MISCELLANEOUS) ×2 IMPLANT

## 2020-12-04 NOTE — Anesthesia Preprocedure Evaluation (Addendum)
Anesthesia Evaluation  Patient identified by MRN, date of birth, ID band Patient awake    Reviewed: Allergy & Precautions, NPO status   Airway Mallampati: II  TM Distance: >3 FB     Dental   Pulmonary    breath sounds clear to auscultation       Cardiovascular hypertension, +CHF  + dysrhythmias (PAF)  Rhythm:Regular Rate:Normal  HLD   Neuro/Psych Dementia CVA    GI/Hepatic   Endo/Other  Hypothyroidism Obesity - BMI 32  Renal/GU Renal disease (stage 4 CKD )     Musculoskeletal  (+) Arthritis , Gout    Abdominal   Peds  Hematology   Anesthesia Other Findings   Reproductive/Obstetrics                             Anesthesia Physical Anesthesia Plan  ASA: III  Anesthesia Plan: MAC   Post-op Pain Management:    Induction: Intravenous  PONV Risk Score and Plan: TIVA, Midazolam and Treatment may vary due to age or medical condition  Airway Management Planned: Natural Airway and Nasal Cannula  Additional Equipment:   Intra-op Plan:   Post-operative Plan:   Informed Consent: I have reviewed the patients History and Physical, chart, labs and discussed the procedure including the risks, benefits and alternatives for the proposed anesthesia with the patient or authorized representative who has indicated his/her understanding and acceptance.       Plan Discussed with: CRNA  Anesthesia Plan Comments:         Anesthesia Quick Evaluation

## 2020-12-04 NOTE — Op Note (Signed)
PREOPERATIVE DIAGNOSIS:  Nuclear sclerotic cataract of the left eye.   POSTOPERATIVE DIAGNOSIS:  Nuclear sclerotic cataract of the left eye.   OPERATIVE PROCEDURE:@   SURGEON:  Birder Robson, MD.   ANESTHESIA:  Anesthesiologist: Veda Canning, MD CRNA: Dionne Bucy, CRNA  1.      Managed anesthesia care. 2.     0.11ml of Shugarcaine was instilled following the paracentesis   COMPLICATIONS:  None.   TECHNIQUE:   Stop and chop   DESCRIPTION OF PROCEDURE:  The patient was examined and consented in the preoperative holding area where the aforementioned topical anesthesia was applied to the left eye and then brought back to the Operating Room where the left eye was prepped and draped in the usual sterile ophthalmic fashion and a lid speculum was placed. A paracentesis was created with the side port blade and the anterior chamber was filled with viscoelastic. A near clear corneal incision was performed with the steel keratome. A continuous curvilinear capsulorrhexis was performed with a cystotome followed by the capsulorrhexis forceps. Hydrodissection and hydrodelineation were carried out with BSS on a blunt cannula. The lens was removed in a stop and chop  technique and the remaining cortical material was removed with the irrigation-aspiration handpiece. The capsular bag was inflated with viscoelastic and the Technis ZCB00 lens was placed in the capsular bag without complication. The remaining viscoelastic was removed from the eye with the irrigation-aspiration handpiece. The wounds were hydrated. The anterior chamber was flushed with BSS and the eye was inflated to physiologic pressure. 0.63ml Vigamox was placed in the anterior chamber. The wounds were found to be water tight. The eye was dressed with Combigan. The patient was given protective glasses to wear throughout the day and a shield with which to sleep tonight. The patient was also given drops with which to begin a drop regimen today and  will follow-up with me in one day. Implant Name Type Inv. Item Serial No. Manufacturer Lot No. LRB No. Used Action  LENS IOL TECNIS EYHANCE 22.0 - [id number] Intraocular Lens LENS IOL TECNIS EYHANCE 22.0 [id number] JOHNSON   Left 1 Implanted    Procedure(s): CATARACT EXTRACTION PHACO AND INTRAOCULAR LENS PLACEMENT (IOC) LEFT 6.95 00:45.9 (Left)  Electronically signed: Birder Robson 12/04/2020 10:38 AM

## 2020-12-04 NOTE — Transfer of Care (Signed)
Immediate Anesthesia Transfer of Care Note  Patient: Penny White  Procedure(s) Performed: CATARACT EXTRACTION PHACO AND INTRAOCULAR LENS PLACEMENT (IOC) LEFT 6.95 00:45.9 (Left Eye)  Patient Location: PACU  Anesthesia Type: MAC  Level of Consciousness: awake, alert  and patient cooperative  Airway and Oxygen Therapy: Patient Spontanous Breathing and Patient connected to supplemental oxygen  Post-op Assessment: Post-op Vital signs reviewed, Patient's Cardiovascular Status Stable, Respiratory Function Stable, Patent Airway and No signs of Nausea or vomiting  Post-op Vital Signs: Reviewed and stable  Complications: No complications documented.

## 2020-12-04 NOTE — Anesthesia Procedure Notes (Signed)
Procedure Name: MAC Date/Time: 12/04/2020 10:22 AM Performed by: Dionne Bucy, CRNA Pre-anesthesia Checklist: Patient identified, Emergency Drugs available, Suction available, Patient being monitored and Timeout performed Patient Re-evaluated:Patient Re-evaluated prior to induction Oxygen Delivery Method: Nasal cannula Placement Confirmation: positive ETCO2

## 2020-12-04 NOTE — H&P (Signed)
[facility]   Primary Care Physician:  Leonel Ramsay, MD Ophthalmologist: Dr. George Ina  Pre-Procedure History & Physical: HPI:  Penny White is a 86 y.o. female here for cataract surgery.   Past Medical History:  Diagnosis Date  . Arthritis   . Atherosclerosis   . Atrial fibrillation ([place])   . CHF (congestive heart failure) ([place])   . Dementia ([place])   . Gout   . Hyperlipemia   . Hypertension   . Hypothyroid   . Kidney disease, chronic, stage III (moderate, EGFR 30-59 ml/min) (HCC)    now stage 4  . Small cell B-cell lymphoma, unspecified site ([place])   . Stroke ([place]) 2018   + 2021.  Peripheral vision damage.  . Subdural hemorrhage ([place]) 2020    Past Surgical History:  Procedure Laterality Date  . CARDIAC CATHETERIZATION    . CHOLECYSTECTOMY  1978  . REPLACEMENT TOTAL KNEE Bilateral    Right 06/02/03,  left 04/20/06  . TUBAL LIGATION      Prior to Admission medications   Medication Sig Start Date End Date Taking? Authorizing Provider  apixaban (ELIQUIS) 5 MG TABS tablet Take 5 mg by mouth 2 (two) times daily.   Yes [provider]  levothyroxine (SYNTHROID) 50 MCG tablet Take 50 mcg by mouth daily before breakfast.   Yes [provider]  losartan (COZAAR) 25 MG tablet Take 25 mg by mouth daily.   Yes [provider]  mupirocin ointment (BACTROBAN) 2 % Apply to the affected area twice daily 12/28/19 12/27/20 Yes Earleen Newport, MD  predniSONE (DELTASONE) 5 MG tablet Take 5 mg by mouth daily with breakfast.   Yes [provider]  rosuvastatin (CRESTOR) 40 MG tablet Take 40 mg by mouth daily.   Yes [provider]    Allergies as of 11/06/2020  . (Not on File)    History reviewed. No pertinent family history.  Social History   Socioeconomic History  . Marital status: Widowed    Spouse name: Not on file  . Number of children: Not on file  . Years of education: Not on file  . Highest education level: Not  on file  Occupational History  . Not on file  Tobacco Use  . Smoking status: Never Smoker  . Smokeless tobacco: Never Used  Vaping Use  . Vaping Use: Never used  Substance and Sexual Activity  . Alcohol use: Not Currently  . Drug use: Not Currently  . Sexual activity: Not Currently  Other Topics Concern  . Not on file  Social History Narrative  . Not on file   Social Determinants of Health   Financial Resource Strain: Not on file  Food Insecurity: Not on file  Transportation Needs: Not on file  Physical Activity: Not on file  Stress: Not on file  Social Connections: Not on file  Intimate Partner Violence: Not on file    Review of Systems: See HPI, otherwise negative ROS  Physical Exam: BP (!) 109/49   Pulse 62   Temp 97.8 F (36.6 C) (Temporal)   Resp 18   Ht 5' 1"  (1.549 m)   Wt 76.7 kg   SpO2 99%   BMI 31.93 kg/m  General:   Alert,  pleasant and cooperative in NAD Head:  Normocephalic and atraumatic. Respiratory:  Normal work of breathing.  Impression/Plan: Penny White is here for cataract surgery.  Risks, benefits, limitations, and alternatives regarding cataract surgery have been reviewed with the patient.  Questions have been answered.  All parties agreeable.   Birder Robson, MD  12/04/2020, 10:14 AM

## 2020-12-04 NOTE — Anesthesia Postprocedure Evaluation (Signed)
Anesthesia Post Note  Patient: Penny White  Procedure(s) Performed: CATARACT EXTRACTION PHACO AND INTRAOCULAR LENS PLACEMENT (IOC) LEFT 6.95 00:45.9 (Left Eye)     Patient location during evaluation: PACU Anesthesia Type: MAC Level of consciousness: awake Pain management: pain level controlled Vital Signs Assessment: post-procedure vital signs reviewed and stable Respiratory status: respiratory function stable Cardiovascular status: stable Postop Assessment: no apparent nausea or vomiting Anesthetic complications: no   No complications documented.  Veda Canning

## 2020-12-05 ENCOUNTER — Encounter: Payer: Self-pay | Admitting: Ophthalmology

## 2020-12-11 ENCOUNTER — Other Ambulatory Visit: Payer: Self-pay

## 2020-12-11 ENCOUNTER — Encounter: Payer: Self-pay | Admitting: Ophthalmology

## 2020-12-14 ENCOUNTER — Other Ambulatory Visit: Payer: Self-pay

## 2020-12-14 ENCOUNTER — Other Ambulatory Visit (HOSPITAL_COMMUNITY): Payer: Self-pay | Admitting: Infectious Diseases

## 2020-12-14 ENCOUNTER — Other Ambulatory Visit
Admission: RE | Admit: 2020-12-14 | Discharge: 2020-12-14 | Disposition: A | Discharge status: Home / Self Care | Payer: Medicare HMO | Source: Ambulatory Visit | Attending: Ophthalmology | Admitting: Ophthalmology

## 2020-12-14 ENCOUNTER — Other Ambulatory Visit: Payer: Self-pay | Admitting: Infectious Diseases

## 2020-12-14 DIAGNOSIS — Z20822 Contact with and (suspected) exposure to covid-19: Secondary | ICD-10-CM | POA: Insufficient documentation

## 2020-12-14 DIAGNOSIS — R319 Hematuria, unspecified: Secondary | ICD-10-CM

## 2020-12-14 DIAGNOSIS — Z01812 Encounter for preprocedural laboratory examination: Secondary | ICD-10-CM | POA: Diagnosis present

## 2020-12-14 DIAGNOSIS — R1084 Generalized abdominal pain: Secondary | ICD-10-CM

## 2020-12-14 NOTE — Discharge Instructions (Signed)

## 2020-12-15 LAB — SARS CORONAVIRUS 2 (TAT 6-24 HRS): SARS Coronavirus 2: NEGATIVE

## 2020-12-18 ENCOUNTER — Encounter: Payer: Self-pay | Admitting: Ophthalmology

## 2020-12-18 ENCOUNTER — Ambulatory Visit: Payer: Medicare HMO | Admitting: Anesthesiology

## 2020-12-18 ENCOUNTER — Ambulatory Visit
Admission: RE | Admit: 2020-12-18 | Discharge: 2020-12-18 | Disposition: A | Discharge status: Home / Self Care | Payer: Medicare HMO | Attending: Ophthalmology | Admitting: Ophthalmology

## 2020-12-18 ENCOUNTER — Other Ambulatory Visit: Payer: Self-pay

## 2020-12-18 ENCOUNTER — Encounter
Admission: RE | Disposition: A | Discharge status: Home / Self Care | Payer: Self-pay | Source: Home / Self Care | Attending: Ophthalmology

## 2020-12-18 DIAGNOSIS — Z79899 Other long term (current) drug therapy: Secondary | ICD-10-CM | POA: Diagnosis not present

## 2020-12-18 DIAGNOSIS — Z79890 Hormone replacement therapy (postmenopausal): Secondary | ICD-10-CM | POA: Diagnosis not present

## 2020-12-18 DIAGNOSIS — Z7952 Long term (current) use of systemic steroids: Secondary | ICD-10-CM | POA: Insufficient documentation

## 2020-12-18 DIAGNOSIS — N184 Chronic kidney disease, stage 4 (severe): Secondary | ICD-10-CM | POA: Insufficient documentation

## 2020-12-18 DIAGNOSIS — H2511 Age-related nuclear cataract, right eye: Secondary | ICD-10-CM | POA: Diagnosis not present

## 2020-12-18 DIAGNOSIS — I130 Hypertensive heart and chronic kidney disease with heart failure and stage 1 through stage 4 chronic kidney disease, or unspecified chronic kidney disease: Secondary | ICD-10-CM | POA: Diagnosis not present

## 2020-12-18 DIAGNOSIS — Z7901 Long term (current) use of anticoagulants: Secondary | ICD-10-CM | POA: Diagnosis not present

## 2020-12-18 DIAGNOSIS — I509 Heart failure, unspecified: Secondary | ICD-10-CM | POA: Diagnosis not present

## 2020-12-18 DIAGNOSIS — Z96653 Presence of artificial knee joint, bilateral: Secondary | ICD-10-CM | POA: Diagnosis not present

## 2020-12-18 DIAGNOSIS — Z8673 Personal history of transient ischemic attack (TIA), and cerebral infarction without residual deficits: Secondary | ICD-10-CM | POA: Diagnosis not present

## 2020-12-18 HISTORY — PX: CATARACT EXTRACTION W/PHACO: SHX586

## 2020-12-18 SURGERY — PHACOEMULSIFICATION, CATARACT, WITH IOL INSERTION
Anesthesia: Monitor Anesthesia Care | Site: Eye | Laterality: Right

## 2020-12-18 MED ORDER — ONDANSETRON HCL 4 MG/2ML IJ SOLN: 4.0000 mg | Freq: Once | INTRAMUSCULAR | Status: DC | PRN

## 2020-12-18 MED ORDER — NA CHONDROIT SULF-NA HYALURON 40-17 MG/ML IO SOLN
INTRAOCULAR | Status: DC | PRN
  Administered 2020-12-18: 1 mL via INTRAOCULAR

## 2020-12-18 MED ORDER — ACETAMINOPHEN 160 MG/5ML PO SOLN: 325.0000 mg | ORAL | Status: DC | PRN

## 2020-12-18 MED ORDER — LIDOCAINE HCL (PF) 2 % IJ SOLN
INTRAOCULAR | Status: DC | PRN
  Administered 2020-12-18: 1 mL

## 2020-12-18 MED ORDER — MOXIFLOXACIN HCL 0.5 % OP SOLN
OPHTHALMIC | Status: DC | PRN
  Administered 2020-12-18: 0.2 mL via OPHTHALMIC

## 2020-12-18 MED ORDER — FENTANYL CITRATE (PF) 100 MCG/2ML IJ SOLN
INTRAMUSCULAR | Status: DC | PRN
  Administered 2020-12-18: 25 ug via INTRAVENOUS

## 2020-12-18 MED ORDER — ARMC OPHTHALMIC DILATING DROPS
1.0000 "application " | OPHTHALMIC | Status: DC | PRN
  Administered 2020-12-18 (×3): 1 via OPHTHALMIC

## 2020-12-18 MED ORDER — MIDAZOLAM HCL 2 MG/2ML IJ SOLN
INTRAMUSCULAR | Status: DC | PRN
  Administered 2020-12-18: .5 mg via INTRAVENOUS

## 2020-12-18 MED ORDER — ACETAMINOPHEN 325 MG PO TABS: 325.0000 mg | ORAL_TABLET | ORAL | Status: DC | PRN

## 2020-12-18 MED ORDER — TETRACAINE HCL 0.5 % OP SOLN
1.0000 [drp] | OPHTHALMIC | Status: DC | PRN
  Administered 2020-12-18 (×3): 1 [drp] via OPHTHALMIC

## 2020-12-18 SURGICAL SUPPLY — 17 items

## 2020-12-18 NOTE — Op Note (Signed)
PREOPERATIVE DIAGNOSIS:  Nuclear sclerotic cataract of the right eye.   POSTOPERATIVE DIAGNOSIS:  H25.11 Cataract   OPERATIVE PROCEDURE:@   SURGEON:  Birder Robson, MD.   ANESTHESIA:  Anesthesiologist: Veda Canning, MD CRNA: Mayme Genta, CRNA  1.      Managed anesthesia care. 2.      0.11ml of Shugarcaine was instilled in the eye following the paracentesis.   COMPLICATIONS:  None.   TECHNIQUE:   Stop and chop   DESCRIPTION OF PROCEDURE:  The patient was examined and consented in the preoperative holding area where the aforementioned topical anesthesia was applied to the right eye and then brought back to the Operating Room where the right eye was prepped and draped in the usual sterile ophthalmic fashion and a lid speculum was placed. A paracentesis was created with the side port blade and the anterior chamber was filled with viscoelastic. A near clear corneal incision was performed with the steel keratome. A continuous curvilinear capsulorrhexis was performed with a cystotome followed by the capsulorrhexis forceps. Hydrodissection and hydrodelineation were carried out with BSS on a blunt cannula. The lens was removed in a stop and chop  technique and the remaining cortical material was removed with the irrigation-aspiration handpiece. The capsular bag was inflated with viscoelastic and the Technis ZCB00  lens was placed in the capsular bag without complication. The remaining viscoelastic was removed from the eye with the irrigation-aspiration handpiece. The wounds were hydrated. The anterior chamber was flushed with BSS and the eye was inflated to physiologic pressure. 0.51ml of Vigamox was placed in the anterior chamber. The wounds were found to be water tight. The eye was dressed with Combigan. The patient was given protective glasses to wear throughout the day and a shield with which to sleep tonight. The patient was also given drops with which to begin a drop regimen today and will  follow-up with me in one day. Implant Name Type Inv. Item Serial No. Manufacturer Lot No. LRB No. Used Action  LENS IOL TECNIS EYHANCE 21.5 - [id number] Intraocular Lens LENS IOL TECNIS EYHANCE 21.5 [id number] JOHNSON   Right 1 Implanted   Procedure(s): CATARACT EXTRACTION PHACO AND INTRAOCULAR LENS PLACEMENT (IOC) RIGHT 7.07 00:42.9 (Right)  Electronically signed: Birder Robson 12/18/2020 12:54 PM

## 2020-12-18 NOTE — Anesthesia Preprocedure Evaluation (Signed)
Anesthesia Evaluation  Patient identified by MRN, date of birth, ID band Patient awake    Reviewed: Allergy & Precautions, NPO status   Airway Mallampati: II  TM Distance: >3 FB     Dental   Pulmonary    breath sounds clear to auscultation       Cardiovascular hypertension, +CHF  + dysrhythmias (PAF)  Rhythm:Regular Rate:Normal  HLD   Neuro/Psych Dementia CVA    GI/Hepatic   Endo/Other  Hypothyroidism Obesity - BMI 32  Renal/GU Renal disease (stage 4 CKD )     Musculoskeletal  (+) Arthritis , Gout    Abdominal   Peds  Hematology   Anesthesia Other Findings   Reproductive/Obstetrics                             Anesthesia Physical  Anesthesia Plan  ASA: III  Anesthesia Plan: MAC   Post-op Pain Management:    Induction: Intravenous  PONV Risk Score and Plan: TIVA, Midazolam and Treatment may vary due to age or medical condition  Airway Management Planned: Natural Airway and Nasal Cannula  Additional Equipment:   Intra-op Plan:   Post-operative Plan:   Informed Consent: I have reviewed the patients History and Physical, chart, labs and discussed the procedure including the risks, benefits and alternatives for the proposed anesthesia with the patient or authorized representative who has indicated his/her understanding and acceptance.       Plan Discussed with: CRNA  Anesthesia Plan Comments:         Anesthesia Quick Evaluation

## 2020-12-18 NOTE — Anesthesia Procedure Notes (Signed)
Performed by: Roza Creamer, CRNA Pre-anesthesia Checklist: Patient identified, Emergency Drugs available, Suction available, Timeout performed and Patient being monitored Patient Re-evaluated:Patient Re-evaluated prior to induction Oxygen Delivery Method: Nasal cannula Placement Confirmation: positive ETCO2       

## 2020-12-18 NOTE — Anesthesia Postprocedure Evaluation (Signed)
Anesthesia Post Note  Patient: Penny White  Procedure(s) Performed: CATARACT EXTRACTION PHACO AND INTRAOCULAR LENS PLACEMENT (IOC) RIGHT 7.07 00:42.9 (Right Eye)     Patient location during evaluation: PACU Anesthesia Type: MAC Level of consciousness: awake Pain management: pain level controlled Vital Signs Assessment: post-procedure vital signs reviewed and stable Respiratory status: respiratory function stable Cardiovascular status: stable Postop Assessment: no apparent nausea or vomiting Anesthetic complications: no   No complications documented.  Veda Canning

## 2020-12-18 NOTE — Transfer of Care (Signed)
Immediate Anesthesia Transfer of Care Note  Patient: Penny White  Procedure(s) Performed: CATARACT EXTRACTION PHACO AND INTRAOCULAR LENS PLACEMENT (IOC) RIGHT 7.07 00:42.9 (Right Eye)  Patient Location: PACU  Anesthesia Type: MAC  Level of Consciousness: awake, alert  and patient cooperative  Airway and Oxygen Therapy: Patient Spontanous Breathing and Patient connected to supplemental oxygen  Post-op Assessment: Post-op Vital signs reviewed, Patient's Cardiovascular Status Stable, Respiratory Function Stable, Patent Airway and No signs of Nausea or vomiting  Post-op Vital Signs: Reviewed and stable  Complications: No complications documented.

## 2020-12-18 NOTE — H&P (Signed)
[facility]   Primary Care Physician:  Leonel Ramsay, MD Ophthalmologist: Dr. George Ina  Pre-Procedure History & Physical: HPI:  Penny White is a 86 y.o. female here for cataract surgery.   Past Medical History:  Diagnosis Date  . Arthritis   . Atherosclerosis   . Atrial fibrillation ([place])   . CHF (congestive heart failure) ([place])   . Dementia ([place])   . Gout   . Hyperlipemia   . Hypertension   . Hypothyroid   . Kidney disease, chronic, stage III (moderate, EGFR 30-59 ml/min) (HCC)    now stage 4  . Small cell B-cell lymphoma, unspecified site ([place])   . Stroke ([place]) 2018   + 2021.  Peripheral vision damage.  . Subdural hemorrhage ([place]) 2020    Past Surgical History:  Procedure Laterality Date  . CARDIAC CATHETERIZATION    . CATARACT EXTRACTION W/PHACO Left 12/04/2020   Procedure: CATARACT EXTRACTION PHACO AND INTRAOCULAR LENS PLACEMENT (IOC) LEFT 6.95 00:45.9;  Surgeon: Birder Robson, MD;  Location: [place];  Service: Ophthalmology;  Laterality: Left;  . CHOLECYSTECTOMY  1978  . REPLACEMENT TOTAL KNEE Bilateral    Right 06/02/03,  left 04/20/06  . TUBAL LIGATION      Prior to Admission medications   Medication Sig Start Date End Date Taking? Authorizing Provider  apixaban (ELIQUIS) 5 MG TABS tablet Take 5 mg by mouth 2 (two) times daily.   Yes [provider]  levothyroxine (SYNTHROID) 50 MCG tablet Take 50 mcg by mouth daily before breakfast.   Yes [provider]  losartan (COZAAR) 25 MG tablet Take 25 mg by mouth daily.   Yes [provider]  predniSONE (DELTASONE) 5 MG tablet Take 5 mg by mouth daily with breakfast.   Yes [provider]  rosuvastatin (CRESTOR) 40 MG tablet Take 40 mg by mouth daily.   Yes [provider]  mupirocin ointment (BACTROBAN) 2 % Apply to the affected area twice daily 12/28/19 12/27/20  Earleen Newport, MD    Allergies as of 11/06/2020  . (Not on File)     History reviewed. No pertinent family history.  Social History   Socioeconomic History  . Marital status: Widowed    Spouse name: Not on file  . Number of children: Not on file  . Years of education: Not on file  . Highest education level: Not on file  Occupational History  . Not on file  Tobacco Use  . Smoking status: Never Smoker  . Smokeless tobacco: Never Used  Vaping Use  . Vaping Use: Never used  Substance and Sexual Activity  . Alcohol use: Not Currently  . Drug use: Not Currently  . Sexual activity: Not Currently  Other Topics Concern  . Not on file  Social History Narrative  . Not on file   Social Determinants of Health   Financial Resource Strain: Not on file  Food Insecurity: Not on file  Transportation Needs: Not on file  Physical Activity: Not on file  Stress: Not on file  Social Connections: Not on file  Intimate Partner Violence: Not on file    Review of Systems: See HPI, otherwise negative ROS  Physical Exam: BP (!) 121/58   Pulse (!) 50   Temp (!) 96.9 F (36.1 C) (Temporal)   Ht 5' 1"  (1.549 m)   Wt 77.4 kg   SpO2 98%   BMI 32.23 kg/m  General:   Alert,  pleasant and cooperative in NAD Head:  Normocephalic and atraumatic. Respiratory:  Normal work of breathing. Cardiovascular:  RRR  Impression/Plan: Penny White is here for cataract surgery.  Risks, benefits, limitations, and alternatives regarding cataract surgery have been reviewed with the patient.  Questions have been answered.  All parties agreeable.   Birder Robson, MD  12/18/2020, 12:18 PM

## 2020-12-19 ENCOUNTER — Encounter: Payer: Self-pay | Admitting: Ophthalmology

## 2020-12-20 ENCOUNTER — Encounter: Payer: Self-pay | Admitting: Dermatology

## 2020-12-20 ENCOUNTER — Ambulatory Visit
Admission: RE | Admit: 2020-12-20 | Discharge: 2020-12-20 | Disposition: A | Discharge status: Home / Self Care | Payer: Medicare HMO | Source: Ambulatory Visit | Attending: Infectious Diseases | Admitting: Infectious Diseases

## 2020-12-20 ENCOUNTER — Ambulatory Visit: Payer: Medicare HMO | Admitting: Dermatology

## 2020-12-20 ENCOUNTER — Other Ambulatory Visit: Payer: Self-pay

## 2020-12-20 DIAGNOSIS — L570 Actinic keratosis: Secondary | ICD-10-CM | POA: Diagnosis not present

## 2020-12-20 DIAGNOSIS — Z85828 Personal history of other malignant neoplasm of skin: Secondary | ICD-10-CM | POA: Diagnosis not present

## 2020-12-20 DIAGNOSIS — R319 Hematuria, unspecified: Secondary | ICD-10-CM | POA: Diagnosis present

## 2020-12-20 DIAGNOSIS — L578 Other skin changes due to chronic exposure to nonionizing radiation: Secondary | ICD-10-CM

## 2020-12-20 DIAGNOSIS — L820 Inflamed seborrheic keratosis: Secondary | ICD-10-CM | POA: Diagnosis not present

## 2020-12-20 DIAGNOSIS — D692 Other nonthrombocytopenic purpura: Secondary | ICD-10-CM

## 2020-12-20 DIAGNOSIS — R1084 Generalized abdominal pain: Secondary | ICD-10-CM

## 2020-12-20 DIAGNOSIS — R238 Other skin changes: Secondary | ICD-10-CM

## 2020-12-20 NOTE — Progress Notes (Signed)
New Patient Visit  Subjective  Penny White is a 86 y.o. female who presents for the following: Skin Problem (New pt  hx of SCC on the L arm removed 6 months ago by her previous dermatologist ) and Rash (Check arms pt report arms feel like sand paper x 2 years ) Check growths on the forehead   Son with pt and contributes to history.  The following portions of the chart were reviewed this encounter and updated as appropriate:   Tobacco  Allergies  Meds  Problems  Med Hx  Surg Hx  Fam Hx     Review of Systems:  No other skin or systemic complaints except as noted in HPI or Assessment and Plan.  Objective  Well appearing patient in no apparent distress; mood and affect are within normal limits.  A focused examination was performed including face,arms. Relevant physical exam findings are noted in the Assessment and Plan.  Objective  Left Upper Arm - Anterior: Well healed scar with no evidence of recurrence, no lymphadenopathy.   Objective  arms (10): Erythematous thin papules/macules with gritty scale.   Objective  arms: Thinning skin   Objective  Head - Anterior (Face) (7): Erythematous thin papules/macules with gritty scale.    Assessment & Plan  History of SCC (squamous cell carcinoma) of skin Left Upper Arm - Anterior Clear. Observe for recurrence. Call clinic for new or changing lesions.  Recommend regular skin exams, daily broad-spectrum spf 30+ sunscreen use, and photoprotection.     Inflamed seborrheic keratosis (10) arms Destruction of lesion - arms Complexity: simple   Destruction method: cryotherapy   Informed consent: discussed and consent obtained   Timeout:  patient name, date of birth, surgical site, and procedure verified Lesion destroyed using liquid nitrogen: Yes   Region frozen until ice ball extended beyond lesion: Yes   Outcome: patient tolerated procedure well with no complications   Post-procedure details: wound care instructions  given    Skin sensitivity -with probable neuropathy and skin thinning with actinic damage and purpura arms Skin sensitivity possible Neuropathy with skin thinning, Actinic damage and purpura  Chronic and persistent.  Start [facility] -for pain and burning Amitriptyline: 5% Gabapentin: 10% Lidocaine: 5% Vehicle: Cream Apply to skin twice a day   May also use Dermend moisturizer with Arnica for bruising  AK (actinic keratosis) (7) Head - Anterior (Face) Destruction of lesion - Head - Anterior (Face) Complexity: simple   Destruction method: cryotherapy   Informed consent: discussed and consent obtained   Timeout:  patient name, date of birth, surgical site, and procedure verified Lesion destroyed using liquid nitrogen: Yes   Region frozen until ice ball extended beyond lesion: Yes   Outcome: patient tolerated procedure well with no complications   Post-procedure details: wound care instructions given     Purpura - Chronic; persistent and recurrent.  Treatable, but not curable. Arms  - Violaceous macules and patches - Benign - Related to trauma, age, sun damage and/or use of blood thinners, chronic use of topical and/or oral steroids - Observe - Can use OTC arnica containing moisturizer such as Dermend Bruise Formula if desired - Call for worsening or other concerns  Actinic Damage Arms  - chronic, secondary to cumulative UV radiation exposure/sun exposure over time - diffuse scaly erythematous macules with underlying dyspigmentation - Recommend daily broad spectrum sunscreen SPF 30+ to sun-exposed areas, reapply every 2 hours as needed.  - Recommend staying in the shade or wearing long  sleeves, sun glasses (UVA+UVB protection) and wide brim hats (4-inch brim around the entire circumference of the hat). - Call for new or changing lesions.  Return in about 6 weeks (around 01/31/2021).  IMarye Round, CMA, am acting as scribe for Sarina Ser, MD .  Documentation:  I have reviewed the above documentation for accuracy and completeness, and I agree with the above.  Sarina Ser, MD

## 2020-12-20 NOTE — Patient Instructions (Addendum)
   Instructions for [facility] Medications  One or more of your medications was sent to the [facility] mail order compounding pharmacy. You will receive an email from them and can purchase the medicine through that link. It will then be mailed to your home at the address you confirmed. If for any reason you do not receive an email from them, please check your spam folder. If you still do not find the email, please let us know. [facility] phone number is [phone].     Cryotherapy Aftercare  . Wash gently with soap and water everyday.   Marland Kitchen Apply Vaseline and Band-Aid daily until healed.    Start over the Dermend for bruising on arms daily

## 2020-12-21 ENCOUNTER — Encounter: Payer: Self-pay | Admitting: Dermatology

## 2021-01-01 ENCOUNTER — Inpatient Hospital Stay: Payer: Medicare HMO

## 2021-01-01 ENCOUNTER — Encounter: Payer: Self-pay | Admitting: Oncology

## 2021-01-01 ENCOUNTER — Inpatient Hospital Stay: Payer: Medicare HMO | Attending: Oncology | Admitting: Oncology

## 2021-01-01 VITALS — BP 114/48 | HR 60 | Temp 97.6°F | Wt 168.0 lb

## 2021-01-01 DIAGNOSIS — N183 Chronic kidney disease, stage 3 unspecified: Secondary | ICD-10-CM | POA: Insufficient documentation

## 2021-01-01 DIAGNOSIS — C8580 Other specified types of non-Hodgkin lymphoma, unspecified site: Secondary | ICD-10-CM

## 2021-01-01 DIAGNOSIS — Z8572 Personal history of non-Hodgkin lymphomas: Secondary | ICD-10-CM | POA: Diagnosis not present

## 2021-01-01 DIAGNOSIS — I482 Chronic atrial fibrillation, unspecified: Secondary | ICD-10-CM | POA: Insufficient documentation

## 2021-01-01 DIAGNOSIS — D472 Monoclonal gammopathy: Secondary | ICD-10-CM | POA: Diagnosis not present

## 2021-01-01 DIAGNOSIS — D649 Anemia, unspecified: Secondary | ICD-10-CM | POA: Insufficient documentation

## 2021-01-01 DIAGNOSIS — D72819 Decreased white blood cell count, unspecified: Secondary | ICD-10-CM | POA: Diagnosis not present

## 2021-01-01 LAB — CBC WITH DIFFERENTIAL/PLATELET
Abs Immature Granulocytes: 0.05 10*3/uL (ref 0.00–0.07)
Basophils Absolute: 0 10*3/uL (ref 0.0–0.1)
Basophils Relative: 2 %
Eosinophils Absolute: 0.1 10*3/uL (ref 0.0–0.5)
Eosinophils Relative: 5 %
HCT: 35.3 % — ABNORMAL LOW (ref 36.0–46.0)
Hemoglobin: 11 g/dL — ABNORMAL LOW (ref 12.0–15.0)
Immature Granulocytes: 2 %
Lymphocytes Relative: 9 %
Lymphs Abs: 0.2 10*3/uL — ABNORMAL LOW (ref 0.7–4.0)
MCH: 29.6 pg (ref 26.0–34.0)
MCHC: 31.2 g/dL (ref 30.0–36.0)
MCV: 94.9 fL (ref 80.0–100.0)
Monocytes Absolute: 0.3 10*3/uL (ref 0.1–1.0)
Monocytes Relative: 10 %
Neutro Abs: 2 10*3/uL (ref 1.7–7.7)
Neutrophils Relative %: 72 %
Platelets: 153 10*3/uL (ref 150–400)
RBC: 3.72 MIL/uL — ABNORMAL LOW (ref 3.87–5.11)
RDW: 15.8 % — ABNORMAL HIGH (ref 11.5–15.5)
WBC: 2.7 10*3/uL — ABNORMAL LOW (ref 4.0–10.5)
nRBC: 0 % (ref 0.0–0.2)

## 2021-01-01 LAB — COMPREHENSIVE METABOLIC PANEL
ALT: 23 U/L (ref 0–44)
AST: 33 U/L (ref 15–41)
Albumin: 3.4 g/dL — ABNORMAL LOW (ref 3.5–5.0)
Alkaline Phosphatase: 58 U/L (ref 38–126)
Anion gap: 10 (ref 5–15)
BUN: 27 mg/dL — ABNORMAL HIGH (ref 8–23)
CO2: 28 mmol/L (ref 22–32)
Calcium: 9 mg/dL (ref 8.9–10.3)
Chloride: 105 mmol/L (ref 98–111)
Creatinine, Ser: 1.97 mg/dL — ABNORMAL HIGH (ref 0.44–1.00)
GFR, Estimated: 25 mL/min — ABNORMAL LOW (ref >60)
Glucose, Bld: 169 mg/dL — ABNORMAL HIGH (ref 70–99)
Potassium: 4.2 mmol/L (ref 3.5–5.1)
Sodium: 143 mmol/L (ref 135–145)
Total Bilirubin: 0.9 mg/dL (ref 0.3–1.2)
Total Protein: 6.1 g/dL — ABNORMAL LOW (ref 6.5–8.1)

## 2021-01-01 LAB — FERRITIN: Ferritin: 42 ng/mL (ref 11–307)

## 2021-01-01 LAB — IRON AND TIBC
Iron: 50 ug/dL (ref 28–170)
Saturation Ratios: 15 % (ref 10.4–31.8)
TIBC: 328 ug/dL (ref 250–450)
UIBC: 278 ug/dL

## 2021-01-01 LAB — FOLATE: Folate: 37 ng/mL (ref >5.9)

## 2021-01-01 LAB — VITAMIN B12: Vitamin B-12: 566 pg/mL (ref 180–914)

## 2021-01-01 LAB — LACTATE DEHYDROGENASE: LDH: 223 U/L — ABNORMAL HIGH (ref 98–192)

## 2021-01-02 ENCOUNTER — Encounter: Payer: Self-pay | Admitting: Oncology

## 2021-01-02 LAB — MULTIPLE MYELOMA PANEL, SERUM
Albumin SerPl Elph-Mcnc: 3.2 g/dL (ref 2.9–4.4)
Albumin/Glob SerPl: 1.4 (ref 0.7–1.7)
Alpha 1: 0.3 g/dL (ref 0.0–0.4)
Alpha2 Glob SerPl Elph-Mcnc: 0.9 g/dL (ref 0.4–1.0)
B-Globulin SerPl Elph-Mcnc: 1 g/dL (ref 0.7–1.3)
Gamma Glob SerPl Elph-Mcnc: 0.1 g/dL — ABNORMAL LOW (ref 0.4–1.8)
Globulin, Total: 2.4 g/dL (ref 2.2–3.9)
IgA: 51 mg/dL — ABNORMAL LOW (ref 64–422)
IgG (Immunoglobin G), Serum: 189 mg/dL — ABNORMAL LOW (ref 586–1602)
IgM (Immunoglobulin M), Srm: 326 mg/dL — ABNORMAL HIGH (ref 26–217)
M Protein SerPl Elph-Mcnc: 0.3 g/dL — ABNORMAL HIGH
Total Protein ELP: 5.6 g/dL — ABNORMAL LOW (ref 6.0–8.5)

## 2021-01-10 ENCOUNTER — Encounter: Payer: Self-pay | Admitting: Oncology

## 2021-01-10 NOTE — Telephone Encounter (Signed)
I will call her today

## 2021-01-13 ENCOUNTER — Encounter: Payer: Self-pay | Admitting: Oncology

## 2021-01-13 NOTE — Progress Notes (Addendum)
Hematology/Oncology Consult note [facility] [phone] [phone]  Patient Care Team: Leonel Ramsay, MD as PCP - General (Infectious Diseases)   Name of the patient: Penny White  [id number]  12/21/1934    Reason for referral- splenic lesions   Referring physician- Dr. Ola Spurr  Date of visit: 01/13/21   History of presenting illness- Patient is a 86 year old female with a past medical history significant for chronic atrial fibrillation, heart failure, B12 deficiency, CVA among other medical problems.  She also has a diagnosis of marginal zone lymphoma for which she was on Bendamustine Rituxan and then maintenance Rituxan up until August 2019.  She then had progression on rituximab and received cycle 6 of obinutuzumab in July 2020 along with Revlimid.  Revlimid was discontinued in April 2020 due to bradycardia.  She has also received IVIG in the past but none since October 2021.  Her last CT chest abdomen pelvis with contrast in September 2020 showed resolution of prior adenopathy.  Scattered subcentimeter lung nodules which were favored to be infectious/inflammatory in etiology.  She was also noted to have multifocal splenic lesions largest measuring 14 mm which was compared to prior scans from April 2020 as well as PET scan from January 2020 and were found to be stable.  She has been presently referred to [place] for splenic hypodensities.  The splenic hypodensities were not FDG avid on prior PET.  Patient did not wish to travel to [facility] for her follow ups and hence moving care to [place]  ECOG PS- 1  Pain scale- 0   Review of systems- Review of Systems  Constitutional: Positive for malaise/fatigue. Negative for chills, fever and weight loss.  HENT: Negative for congestion, ear discharge and nosebleeds.   Eyes: Negative for blurred vision.  Respiratory: Negative for cough, hemoptysis, sputum production, shortness of breath and  wheezing.   Cardiovascular: Negative for chest pain, palpitations, orthopnea and claudication.  Gastrointestinal: Negative for abdominal pain, blood in stool, constipation, diarrhea, heartburn, melena, nausea and vomiting.  Genitourinary: Negative for dysuria, flank pain, frequency, hematuria and urgency.  Musculoskeletal: Negative for back pain, joint pain and myalgias.  Skin: Negative for rash.  Neurological: Negative for dizziness, tingling, focal weakness, seizures, weakness and headaches.  Endo/Heme/Allergies: Does not bruise/bleed easily.  Psychiatric/Behavioral: Negative for depression and suicidal ideas. The patient does not have insomnia.     Allergies  Allergen Reactions  . Novocain [Procaine]     Doesn't remember reaction    There are no problems to display for this patient.    Past Medical History:  Diagnosis Date  . Arthritis   . Atherosclerosis   . Atrial fibrillation ([place])   . CHF (congestive heart failure) ([place])   . Dementia ([place])   . Gout   . Hyperlipemia   . Hypertension   . Hypothyroid   . Kidney disease, chronic, stage III (moderate, EGFR 30-59 ml/min) (HCC)    now stage 4  . Small cell B-cell lymphoma, unspecified site ([place])   . Squamous cell carcinoma of skin    L arm removed by Dr Phillip Heal   . Stroke [facility]) 2018   + 2021.  Peripheral vision damage.  . Subdural hemorrhage ([place]) 2020     Past Surgical History:  Procedure Laterality Date  . CARDIAC CATHETERIZATION    . CATARACT EXTRACTION W/PHACO Left 12/04/2020   Procedure: CATARACT EXTRACTION PHACO AND INTRAOCULAR LENS PLACEMENT (IOC) LEFT 6.95 00:45.9;  Surgeon: Birder Robson, MD;  Location: [facility];  Service: Ophthalmology;  Laterality: Left;  . CATARACT EXTRACTION W/PHACO Right 12/18/2020   Procedure: CATARACT EXTRACTION PHACO AND INTRAOCULAR LENS PLACEMENT (IOC) RIGHT 7.07 00:42.9;  Surgeon: Birder Robson, MD;  Location: [facility];  Service: Ophthalmology;   Laterality: Right;  . CHOLECYSTECTOMY  1978  . REPLACEMENT TOTAL KNEE Bilateral    Right 06/02/03,  left 04/20/06  . TUBAL LIGATION      Social History   Socioeconomic History  . Marital status: Widowed    Spouse name: Not on file  . Number of children: Not on file  . Years of education: Not on file  . Highest education level: Not on file  Occupational History  . Not on file  Tobacco Use  . Smoking status: Never Smoker  . Smokeless tobacco: Never Used  Vaping Use  . Vaping Use: Never used  Substance and Sexual Activity  . Alcohol use: Not Currently  . Drug use: Not Currently  . Sexual activity: Not Currently  Other Topics Concern  . Not on file  Social History Narrative  . Not on file   Social Determinants of Health   Financial Resource Strain: Not on file  Food Insecurity: Not on file  Transportation Needs: Not on file  Physical Activity: Not on file  Stress: Not on file  Social Connections: Not on file  Intimate Partner Violence: Not on file     History reviewed. No pertinent family history.   Current Outpatient Medications:  .  apixaban (ELIQUIS) 5 MG TABS tablet, Take 5 mg by mouth 2 (two) times daily., Disp: , Rfl:  .  levothyroxine (SYNTHROID) 50 MCG tablet, Take 50 mcg by mouth daily before breakfast., Disp: , Rfl:  .  losartan (COZAAR) 25 MG tablet, Take 25 mg by mouth daily., Disp: , Rfl:  .  predniSONE (DELTASONE) 5 MG tablet, Take 5 mg by mouth daily with breakfast., Disp: , Rfl:  .  rosuvastatin (CRESTOR) 40 MG tablet, Take 40 mg by mouth daily., Disp: , Rfl:    Physical exam:  Vitals:   01/01/21 1401  BP: (!) 114/48  Pulse: 60  Temp: 97.6 F (36.4 C)  TempSrc: Tympanic  SpO2: 99%  Weight: 168 lb (76.2 kg)   Physical Exam Cardiovascular:     Rate and Rhythm: Normal rate and regular rhythm.     Heart sounds: Normal heart sounds.  Pulmonary:     Effort: Pulmonary effort is normal.     Breath sounds: Normal breath sounds.  Abdominal:      General: Bowel sounds are normal. There is no distension.     Palpations: Abdomen is soft.     Tenderness: There is no abdominal tenderness.     Comments: No palpable hepatosplenomegaly  Lymphadenopathy:     Comments: No palpable cervical, supraclavicular, axillary or inguinal adenopathy   Skin:    General: Skin is warm and dry.  Neurological:     Mental Status: She is alert and oriented to person, place, and time.        CMP Latest Ref Rng & Units 01/01/2021  Glucose 70 - 99 mg/dL 169(H)  BUN 8 - 23 mg/dL 27(H)  Creatinine 0.44 - 1.00 mg/dL 1.97(H)  Sodium 135 - 145 mmol/L 143  Potassium 3.5 - 5.1 mmol/L 4.2  Chloride 98 - 111 mmol/L 105  CO2 22 - 32 mmol/L 28  Calcium 8.9 - 10.3 mg/dL 9.0  Total Protein 6.5 - 8.1 g/dL 6.1(L)  Total Bilirubin 0.3 - 1.2  mg/dL 0.9  Alkaline Phos 38 - 126 U/L 58  AST 15 - 41 U/L 33  ALT 0 - 44 U/L 23   CBC Latest Ref Rng & Units 01/01/2021  WBC 4.0 - 10.5 K/uL 2.7(L)  Hemoglobin 12.0 - 15.0 g/dL 11.0(L)  Hematocrit 36.0 - 46.0 % 35.3(L)  Platelets 150 - 400 K/uL 153    No images are attached to the encounter.  CT RENAL STONE STUDY  Result Date: 12/21/2020 CLINICAL DATA:  Abdominal pain, hematuria EXAM: CT ABDOMEN AND PELVIS WITHOUT CONTRAST TECHNIQUE: Multidetector CT imaging of the abdomen and pelvis was performed following the standard protocol without IV contrast. COMPARISON:  Renal ultrasound dated 06/29/2020 FINDINGS: Lower chest: Mild subpleural reticulation/scarring at the lung bases. Hepatobiliary: Unenhanced liver is unremarkable. Status post cholecystectomy. No intrahepatic or extrahepatic ductal dilatation. Pancreas: Within normal limits. Spleen: Low-density splenic lesions measuring up to 16 mm (series 2/image 20), poorly evaluated but likely benign. Adrenals/Urinary Tract: Adrenal glands are within normal limits. Kidneys are within normal limits. No renal, ureteral, or bladder calculi. No hydronephrosis. Low-lying bladder.  Stomach/Bowel: Stomach is within normal limits. No evidence of bowel obstruction. Normal appendix (series 2/image 60). Extensive colonic diverticulosis, without evidence of diverticulitis. Vascular/Lymphatic: No evidence of abdominal aortic aneurysm. Atherosclerotic calcifications of the abdominal aorta and branch vessels. No suspicious abdominopelvic lymphadenopathy. Reproductive: Uterus is within normal limits. Bilateral ovaries are within normal limits. Other: No abdominopelvic ascites. Musculoskeletal: Degenerative changes of the visualized thoracolumbar spine. IMPRESSION: No renal, ureteral, or bladder calculi.  No hydronephrosis. No evidence of bowel obstruction. Normal appendix. Extensive colonic diverticulosis, without evidence of diverticulitis. Low-density splenic lesions, poorly evaluated unenhanced CT but likely benign. Electronically Signed   By: Julian Hy M.D.   On: 12/21/2020 09:49    Assessment and plan- Patient is a 86 y.o. female with prior h/o marginal zone lymphoma last received treatment in 2020 and chronic splenic lesions referred to establish follow up closer to home   With regards to splenic lesions these were noted even back in 2019 on PET CT scans and they were not FDG avid.  She recently had a CT renal stone study on 12/20/2020 which showed low-density splenic lesions measuring up to 16 mm which appeared to be similar in size as compared to her priorPET/CT and CT scan findings and as such do not need to be followed up.  She was not known to have any abdominopelvic adenopathy on her CT abdomen that would be suggestive of progressive lymphoma.  Today I will check a CBC with differential, CMP, LDH, ferritin and iron studies B12 and myeloma panel and folate given her mild anemia noted on her recent blood work.  Patient was also noted to have mild leukopenia based on her recent labs but her white cell count has fluctuated between 1.8-3.5 since August 2020.  Follow-up to be  decided based on today's blood work  Thank you for this kind referral and the opportunity to participate in the care of this  Patient   Visit Diagnosis 1. Marginal zone B-cell lymphoma ([place])   2. MGUS (monoclonal gammopathy of unknown significance)     Dr. Randa Evens, MD, MPH [facility] at [facility] [phone] 01/13/2021

## 2021-01-14 NOTE — Telephone Encounter (Signed)
Video visit Wednesday this week

## 2021-01-16 ENCOUNTER — Telehealth: Payer: Self-pay | Admitting: Oncology

## 2021-01-16 NOTE — Telephone Encounter (Signed)
I spoke with patient's son (POA)  and explained that we could not do a virtual visit without the patient present--he asked if we could just arrange a phone call--he lives in [place] and he stated that due to her dementia, she would not be able to come to an appointment without him and he'd like to avoid the stress of transporting her.I told patient that I would have to discuss with MD first and get back with him. Patient's son agreeable.

## 2021-01-18 ENCOUNTER — Inpatient Hospital Stay: Payer: Medicare HMO | Admitting: Oncology

## 2021-01-18 ENCOUNTER — Encounter: Payer: Self-pay | Admitting: Oncology

## 2021-01-21 ENCOUNTER — Other Ambulatory Visit: Payer: Self-pay | Admitting: *Deleted

## 2021-01-21 DIAGNOSIS — D472 Monoclonal gammopathy: Secondary | ICD-10-CM

## 2021-01-21 DIAGNOSIS — D649 Anemia, unspecified: Secondary | ICD-10-CM

## 2021-01-21 DIAGNOSIS — C8580 Other specified types of non-Hodgkin lymphoma, unspecified site: Secondary | ICD-10-CM

## 2021-01-28 ENCOUNTER — Ambulatory Visit: Payer: Medicare HMO | Admitting: Dermatology

## 2021-02-21 ENCOUNTER — Encounter: Payer: Self-pay | Admitting: Oncology

## 2021-03-22 ENCOUNTER — Other Ambulatory Visit: Payer: Self-pay

## 2021-03-22 ENCOUNTER — Inpatient Hospital Stay: Payer: Medicare HMO | Attending: Oncology

## 2021-03-22 ENCOUNTER — Inpatient Hospital Stay: Payer: Medicare HMO

## 2021-03-22 DIAGNOSIS — D472 Monoclonal gammopathy: Secondary | ICD-10-CM

## 2021-03-22 DIAGNOSIS — R7402 Elevation of levels of lactic acid dehydrogenase (LDH): Secondary | ICD-10-CM | POA: Diagnosis not present

## 2021-03-22 DIAGNOSIS — Z8673 Personal history of transient ischemic attack (TIA), and cerebral infarction without residual deficits: Secondary | ICD-10-CM | POA: Diagnosis not present

## 2021-03-22 DIAGNOSIS — R319 Hematuria, unspecified: Secondary | ICD-10-CM | POA: Diagnosis not present

## 2021-03-22 DIAGNOSIS — Z8572 Personal history of non-Hodgkin lymphomas: Secondary | ICD-10-CM | POA: Insufficient documentation

## 2021-03-22 DIAGNOSIS — I130 Hypertensive heart and chronic kidney disease with heart failure and stage 1 through stage 4 chronic kidney disease, or unspecified chronic kidney disease: Secondary | ICD-10-CM | POA: Insufficient documentation

## 2021-03-22 DIAGNOSIS — I509 Heart failure, unspecified: Secondary | ICD-10-CM | POA: Diagnosis not present

## 2021-03-22 DIAGNOSIS — Z9221 Personal history of antineoplastic chemotherapy: Secondary | ICD-10-CM | POA: Insufficient documentation

## 2021-03-22 DIAGNOSIS — D649 Anemia, unspecified: Secondary | ICD-10-CM

## 2021-03-22 DIAGNOSIS — I482 Chronic atrial fibrillation, unspecified: Secondary | ICD-10-CM | POA: Diagnosis not present

## 2021-03-22 DIAGNOSIS — R5383 Other fatigue: Secondary | ICD-10-CM | POA: Diagnosis not present

## 2021-03-22 DIAGNOSIS — E538 Deficiency of other specified B group vitamins: Secondary | ICD-10-CM | POA: Diagnosis not present

## 2021-03-22 DIAGNOSIS — R590 Localized enlarged lymph nodes: Secondary | ICD-10-CM | POA: Insufficient documentation

## 2021-03-22 DIAGNOSIS — C8580 Other specified types of non-Hodgkin lymphoma, unspecified site: Secondary | ICD-10-CM

## 2021-03-22 LAB — CBC WITH DIFFERENTIAL/PLATELET
Abs Immature Granulocytes: 0.04 10*3/uL (ref 0.00–0.07)
Basophils Absolute: 0.1 10*3/uL (ref 0.0–0.1)
Basophils Relative: 2 %
Eosinophils Absolute: 0.3 10*3/uL (ref 0.0–0.5)
Eosinophils Relative: 10 %
HCT: 36.5 % (ref 36.0–46.0)
Hemoglobin: 11.1 g/dL — ABNORMAL LOW (ref 12.0–15.0)
Immature Granulocytes: 1 %
Lymphocytes Relative: 12 %
Lymphs Abs: 0.4 10*3/uL — ABNORMAL LOW (ref 0.7–4.0)
MCH: 29 pg (ref 26.0–34.0)
MCHC: 30.4 g/dL (ref 30.0–36.0)
MCV: 95.3 fL (ref 80.0–100.0)
Monocytes Absolute: 0.5 10*3/uL (ref 0.1–1.0)
Monocytes Relative: 16 %
Neutro Abs: 2 10*3/uL (ref 1.7–7.7)
Neutrophils Relative %: 59 %
Platelets: 160 10*3/uL (ref 150–400)
RBC: 3.83 MIL/uL — ABNORMAL LOW (ref 3.87–5.11)
RDW: 15.9 % — ABNORMAL HIGH (ref 11.5–15.5)
WBC: 3.3 10*3/uL — ABNORMAL LOW (ref 4.0–10.5)
nRBC: 0 % (ref 0.0–0.2)

## 2021-03-27 ENCOUNTER — Encounter: Payer: Self-pay | Admitting: Oncology

## 2021-04-02 ENCOUNTER — Inpatient Hospital Stay: Payer: Medicare HMO | Admitting: Nurse Practitioner

## 2021-04-05 ENCOUNTER — Emergency Department
Admission: EM | Admit: 2021-04-05 | Discharge: 2021-04-05 | Disposition: A | Discharge status: Home / Self Care | Payer: Medicare HMO | Attending: Emergency Medicine | Admitting: Emergency Medicine

## 2021-04-05 ENCOUNTER — Other Ambulatory Visit: Payer: Self-pay

## 2021-04-05 DIAGNOSIS — I509 Heart failure, unspecified: Secondary | ICD-10-CM | POA: Diagnosis not present

## 2021-04-05 DIAGNOSIS — R310 Gross hematuria: Secondary | ICD-10-CM | POA: Diagnosis not present

## 2021-04-05 DIAGNOSIS — Z96653 Presence of artificial knee joint, bilateral: Secondary | ICD-10-CM | POA: Diagnosis not present

## 2021-04-05 DIAGNOSIS — Z7901 Long term (current) use of anticoagulants: Secondary | ICD-10-CM | POA: Insufficient documentation

## 2021-04-05 DIAGNOSIS — N184 Chronic kidney disease, stage 4 (severe): Secondary | ICD-10-CM | POA: Insufficient documentation

## 2021-04-05 DIAGNOSIS — Z85828 Personal history of other malignant neoplasm of skin: Secondary | ICD-10-CM | POA: Insufficient documentation

## 2021-04-05 DIAGNOSIS — E039 Hypothyroidism, unspecified: Secondary | ICD-10-CM | POA: Diagnosis not present

## 2021-04-05 DIAGNOSIS — F0390 Unspecified dementia without behavioral disturbance: Secondary | ICD-10-CM | POA: Insufficient documentation

## 2021-04-05 DIAGNOSIS — I4891 Unspecified atrial fibrillation: Secondary | ICD-10-CM | POA: Diagnosis not present

## 2021-04-05 DIAGNOSIS — I130 Hypertensive heart and chronic kidney disease with heart failure and stage 1 through stage 4 chronic kidney disease, or unspecified chronic kidney disease: Secondary | ICD-10-CM | POA: Insufficient documentation

## 2021-04-05 DIAGNOSIS — Z79899 Other long term (current) drug therapy: Secondary | ICD-10-CM | POA: Diagnosis not present

## 2021-04-05 DIAGNOSIS — R319 Hematuria, unspecified: Secondary | ICD-10-CM

## 2021-04-05 LAB — CBC WITH DIFFERENTIAL/PLATELET
Abs Immature Granulocytes: 0.05 10*3/uL (ref 0.00–0.07)
Basophils Absolute: 0.1 10*3/uL (ref 0.0–0.1)
Basophils Relative: 2 %
Eosinophils Absolute: 0.1 10*3/uL (ref 0.0–0.5)
Eosinophils Relative: 5 %
HCT: 35.6 % — ABNORMAL LOW (ref 36.0–46.0)
Hemoglobin: 10.8 g/dL — ABNORMAL LOW (ref 12.0–15.0)
Immature Granulocytes: 2 %
Lymphocytes Relative: 11 %
Lymphs Abs: 0.4 10*3/uL — ABNORMAL LOW (ref 0.7–4.0)
MCH: 29 pg (ref 26.0–34.0)
MCHC: 30.3 g/dL (ref 30.0–36.0)
MCV: 95.4 fL (ref 80.0–100.0)
Monocytes Absolute: 0.6 10*3/uL (ref 0.1–1.0)
Monocytes Relative: 18 %
Neutro Abs: 2 10*3/uL (ref 1.7–7.7)
Neutrophils Relative %: 62 %
Platelets: 164 10*3/uL (ref 150–400)
RBC: 3.73 MIL/uL — ABNORMAL LOW (ref 3.87–5.11)
RDW: 15.7 % — ABNORMAL HIGH (ref 11.5–15.5)
WBC: 3.1 10*3/uL — ABNORMAL LOW (ref 4.0–10.5)
nRBC: 0 % (ref 0.0–0.2)

## 2021-04-05 LAB — URINALYSIS, COMPLETE (UACMP) WITH MICROSCOPIC
Bacteria, UA: NONE SEEN
RBC / HPF: >50 RBC/hpf — ABNORMAL HIGH (ref 0–5)
Specific Gravity, Urine: 1.006 (ref 1.005–1.030)
Squamous Epithelial / HPF: NONE SEEN (ref 0–5)
WBC, UA: NONE SEEN WBC/hpf (ref 0–5)

## 2021-04-05 LAB — BASIC METABOLIC PANEL
Anion gap: 7 (ref 5–15)
BUN: 27 mg/dL — ABNORMAL HIGH (ref 8–23)
CO2: 22 mmol/L (ref 22–32)
Calcium: 8.7 mg/dL — ABNORMAL LOW (ref 8.9–10.3)
Chloride: 111 mmol/L (ref 98–111)
Creatinine, Ser: 1.72 mg/dL — ABNORMAL HIGH (ref 0.44–1.00)
GFR, Estimated: 29 mL/min — ABNORMAL LOW (ref >60)
Glucose, Bld: 92 mg/dL (ref 70–99)
Potassium: 5 mmol/L (ref 3.5–5.1)
Sodium: 140 mmol/L (ref 135–145)

## 2021-04-05 LAB — PROTIME-INR
INR: 1.8 — ABNORMAL HIGH (ref 0.8–1.2)
Prothrombin Time: 21.3 seconds — ABNORMAL HIGH (ref 11.4–15.2)

## 2021-04-05 NOTE — Discharge Instructions (Addendum)
Hold your Eliquis x3 days, then resume.  Urine culture is pending.  You will be notified of any positive results requiring antibiotics.  Return to the ER for worsening symptoms, persistent vomiting, retaining urine, fever or other concerns.

## 2021-04-05 NOTE — ED Provider Notes (Signed)
[facility] Emergency Department Provider Note   ____________________________________________   Event Date/Time   First MD Initiated Contact with Patient 04/05/21 [phone]     (approximate)  I have reviewed the triage vital signs and the nursing notes.   HISTORY  Chief Complaint Hematuria    HPI Penny White is a 86 y.o. female brought to the ED via EMS from home [facility] [place] with a chief complaint of hematuria.  Patient has a history of atrial fibrillation on Eliquis, stage IV kidney disease with proteinuria and hematuria.  Reports gross hematuria starting around 11 PM without associated dysuria, abdominal pain, flank pain, nausea/vomiting, fever/chills.  Denies rectal bleeding.  Denies recent trauma.     Past Medical History:  Diagnosis Date   Arthritis    Atherosclerosis    Atrial fibrillation (HCC)    CHF (congestive heart failure) (HCC)    Dementia (HCC)    Gout    Hyperlipemia    Hypertension    Hypothyroid    Kidney disease, chronic, stage III (moderate, EGFR 30-59 ml/min) (HCC)    now stage 4   Small cell B-cell lymphoma, unspecified site (HCC)    Squamous cell carcinoma of skin    L arm removed by Dr Phillip Heal    Stroke [facility]) 2018   + 2021.  Peripheral vision damage.   Subdural hemorrhage ([place]) 2020    There are no problems to display for this patient.   Past Surgical History:  Procedure Laterality Date   CARDIAC CATHETERIZATION     CATARACT EXTRACTION W/PHACO Left 12/04/2020   Procedure: CATARACT EXTRACTION PHACO AND INTRAOCULAR LENS PLACEMENT (IOC) LEFT 6.95 00:45.9;  Surgeon: Birder Robson, MD;  Location: [facility];  Service: Ophthalmology;  Laterality: Left;   CATARACT EXTRACTION W/PHACO Right 12/18/2020   Procedure: CATARACT EXTRACTION PHACO AND INTRAOCULAR LENS PLACEMENT (IOC) RIGHT 7.07 00:42.9;  Surgeon: Birder Robson, MD;  Location: [facility];  Service: Ophthalmology;  Laterality: Right;    CHOLECYSTECTOMY  1978   REPLACEMENT TOTAL KNEE Bilateral    Right 06/02/03,  left 04/20/06   TUBAL LIGATION      Prior to Admission medications   Medication Sig Start Date End Date Taking? Authorizing Provider  apixaban (ELIQUIS) 5 MG TABS tablet Take 5 mg by mouth 2 (two) times daily.    [provider]  levothyroxine (SYNTHROID) 50 MCG tablet Take 50 mcg by mouth daily before breakfast.    [provider]  losartan (COZAAR) 25 MG tablet Take 25 mg by mouth daily.    [provider]  predniSONE (DELTASONE) 5 MG tablet Take 5 mg by mouth daily with breakfast.    [provider]  rosuvastatin (CRESTOR) 40 MG tablet Take 40 mg by mouth daily.    [provider]    Allergies Novocain [procaine]  No family history on file.  Social History Social History   Tobacco Use   Smoking status: Never   Smokeless tobacco: Never  Vaping Use   Vaping Use: Never used  Substance Use Topics   Alcohol use: Not Currently   Drug use: Not Currently    Review of Systems  Constitutional: No fever/chills Eyes: No visual changes. ENT: No sore throat. Cardiovascular: Denies chest pain. Respiratory: Denies shortness of breath. Gastrointestinal: No abdominal pain.  No nausea, no vomiting.  No diarrhea.  No constipation. Genitourinary: Positive for hematuria.  Negative for dysuria. Musculoskeletal: Negative for back pain. Skin: Negative for rash. Neurological: Negative for  headaches, focal weakness or numbness.   ____________________________________________   PHYSICAL EXAM:  VITAL SIGNS: ED Triage Vitals  Enc Vitals Group     BP --      Pulse Rate 04/05/21 0308 (!) 59     Resp 04/05/21 0308 16     Temp 04/05/21 0308 98.4 F (36.9 C)     Temp Source 04/05/21 0308 Oral     SpO2 04/05/21 0308 100 %     Weight 04/05/21 0310 178 lb 12.7 oz (81.1 kg)     Height 04/05/21 0310 5' 1"  (1.549 m)     Head Circumference --      Peak Flow --      Pain  Score 04/05/21 0310 0     Pain Loc --      Pain Edu? --      Excl. in [place]? --     Constitutional: Alert and oriented.  Elderly appearing and in no acute distress. Eyes: Conjunctivae are normal. PERRL. EOMI. Head: Atraumatic. Nose: No congestion/rhinnorhea. Mouth/Throat: Mucous membranes are moist.   Neck: No stridor.   Cardiovascular: Normal rate, regular rhythm. Grossly normal heart sounds.  Good peripheral circulation. Respiratory: Normal respiratory effort.  No retractions. Lungs CTAB. Gastrointestinal: Soft and nontender to light or deep palpation. No distention. No abdominal bruits. No CVA tenderness. Musculoskeletal: No lower extremity tenderness nor edema.  No joint effusions. Neurologic:  Normal speech and language. No gross focal neurologic deficits are appreciated. No gait instability. Skin:  Skin is warm, dry and intact. No rash noted. Psychiatric: Mood and affect are normal. Speech and behavior are normal.  ____________________________________________   LABS (all labs ordered are listed, but only abnormal results are displayed)  Labs Reviewed  PROTIME-INR - Abnormal; Notable for the following components:      Result Value   Prothrombin Time 21.3 (*)    INR 1.8 (*)    All other components within normal limits  URINALYSIS, COMPLETE (UACMP) WITH MICROSCOPIC - Abnormal; Notable for the following components:   Color, Urine RED (*)    APPearance TURBID (*)    Glucose, UA   (*)    Value: TEST NOT REPORTED DUE TO COLOR INTERFERENCE OF URINE PIGMENT   Hgb urine dipstick   (*)    Value: TEST NOT REPORTED DUE TO COLOR INTERFERENCE OF URINE PIGMENT   Bilirubin Urine   (*)    Value: TEST NOT REPORTED DUE TO COLOR INTERFERENCE OF URINE PIGMENT   Ketones, ur   (*)    Value: TEST NOT REPORTED DUE TO COLOR INTERFERENCE OF URINE PIGMENT   Protein, ur   (*)    Value: TEST NOT REPORTED DUE TO COLOR INTERFERENCE OF URINE PIGMENT   Nitrite   (*)    Value: TEST NOT REPORTED DUE TO  COLOR INTERFERENCE OF URINE PIGMENT   Leukocytes,Ua   (*)    Value: TEST NOT REPORTED DUE TO COLOR INTERFERENCE OF URINE PIGMENT   RBC / HPF >50 (*)    All other components within normal limits  BASIC METABOLIC PANEL - Abnormal; Notable for the following components:   BUN 27 (*)    Creatinine, Ser 1.72 (*)    Calcium 8.7 (*)    GFR, Estimated 29 (*)    All other components within normal limits  CBC WITH DIFFERENTIAL/PLATELET - Abnormal; Notable for the following components:   WBC 3.1 (*)    RBC 3.73 (*)    Hemoglobin 10.8 (*)    HCT 35.6 (*)  RDW 15.7 (*)    Lymphs Abs 0.4 (*)    All other components within normal limits  URINE CULTURE  CBC WITH DIFFERENTIAL/PLATELET   ____________________________________________  EKG  None ____________________________________________  RADIOLOGY I, Dionisia Pacholski J, personally viewed and evaluated these images (plain radiographs) as part of my medical decision making, as well as reviewing the written report by the radiologist.  ED MD interpretation: None  Official radiology report(s): No results found.  ____________________________________________   PROCEDURES  Procedure(s) performed (including Critical Care):  Procedures   ____________________________________________   INITIAL IMPRESSION / ASSESSMENT AND PLAN / ED COURSE  As part of my medical decision making, I reviewed the following data within the [place] notes reviewed and incorporated, Labs reviewed, Old chart reviewed, and Notes from prior ED visits     86 year old female presenting with hematuria; history of CKD IV with proteinuria and hematuria, on Eliquis.  Differential diagnosis includes but is not limited to UTI, kidney stones, medication induced, idiopathic, malignancy, etc.  Will obtain basic lab work, coags, UA.  Will reassess.  Clinical Course as of 04/05/21 0629  Fri Apr 05, 2021  0510 Updated patient on all test results; stable  WBC, H/H, BUN/creatinine from prior.  Urine culture is pending.  Will have patient hold her Eliquis x3 days.  Strict return precautions given.  Patient verbalizes understanding agrees with plan of care. [JS]    Clinical Course User Index [JS] Paulette Blanch, MD     ____________________________________________   FINAL CLINICAL IMPRESSION(S) / ED DIAGNOSES  Final diagnoses:  Hematuria, unspecified type     ED Discharge Orders     None        Note:  This document was prepared using Dragon voice recognition software and may include unintentional dictation errors.    Paulette Blanch, MD 04/05/21 0630

## 2021-04-05 NOTE — ED Notes (Signed)
Pt assisted to toilet with walker by this RN. Some blood noted to pad in brief. Pt given pad to change in brief. Large amount of clots in urine hat. Urine sample collected and sent to lab. Pt assisted back into bed and covered with warm blankets.

## 2021-04-05 NOTE — ED Notes (Signed)
Called [facility] to return patient back to [place] @ [place] @ 05:51AM

## 2021-04-05 NOTE — ED Notes (Signed)
Report called to [place].

## 2021-04-05 NOTE — ED Triage Notes (Signed)
Pt to ED from [place] via [facility]. Reports hematuria at 2300 yesterday evening when urinating. Denies pain or burning when urinating. Alert and oriented x4. NAD noted.

## 2021-04-06 ENCOUNTER — Other Ambulatory Visit: Payer: Self-pay

## 2021-04-06 ENCOUNTER — Emergency Department
Admission: EM | Admit: 2021-04-06 | Discharge: 2021-04-06 | Disposition: A | Discharge status: Home / Self Care | Payer: Medicare HMO | Attending: Emergency Medicine | Admitting: Emergency Medicine

## 2021-04-06 ENCOUNTER — Emergency Department: Payer: Medicare HMO

## 2021-04-06 DIAGNOSIS — R319 Hematuria, unspecified: Secondary | ICD-10-CM | POA: Diagnosis present

## 2021-04-06 DIAGNOSIS — R310 Gross hematuria: Secondary | ICD-10-CM | POA: Diagnosis not present

## 2021-04-06 DIAGNOSIS — R600 Localized edema: Secondary | ICD-10-CM | POA: Insufficient documentation

## 2021-04-06 DIAGNOSIS — Z20822 Contact with and (suspected) exposure to covid-19: Secondary | ICD-10-CM | POA: Diagnosis not present

## 2021-04-06 LAB — CBC
HCT: 32.4 % — ABNORMAL LOW (ref 36.0–46.0)
Hemoglobin: 9.9 g/dL — ABNORMAL LOW (ref 12.0–15.0)
MCH: 28.9 pg (ref 26.0–34.0)
MCHC: 30.6 g/dL (ref 30.0–36.0)
MCV: 94.5 fL (ref 80.0–100.0)
Platelets: 174 10*3/uL (ref 150–400)
RBC: 3.43 MIL/uL — ABNORMAL LOW (ref 3.87–5.11)
RDW: 15.7 % — ABNORMAL HIGH (ref 11.5–15.5)
WBC: 3.5 10*3/uL — ABNORMAL LOW (ref 4.0–10.5)
nRBC: 0 % (ref 0.0–0.2)

## 2021-04-06 LAB — URINE CULTURE: Culture: NO GROWTH

## 2021-04-06 LAB — URINALYSIS, COMPLETE (UACMP) WITH MICROSCOPIC
Bilirubin Urine: NEGATIVE
Glucose, UA: NEGATIVE mg/dL
Ketones, ur: NEGATIVE mg/dL
Nitrite: NEGATIVE
Protein, ur: 100 mg/dL — AB
Specific Gravity, Urine: 1.019 (ref 1.005–1.030)
pH: 5 (ref 5.0–8.0)

## 2021-04-06 LAB — RESP PANEL BY RT-PCR (FLU A&B, COVID) ARPGX2
Influenza A by PCR: NEGATIVE
Influenza B by PCR: NEGATIVE
SARS Coronavirus 2 by RT PCR: NEGATIVE

## 2021-04-06 LAB — BASIC METABOLIC PANEL
Anion gap: 6 (ref 5–15)
BUN: 31 mg/dL — ABNORMAL HIGH (ref 8–23)
CO2: 25 mmol/L (ref 22–32)
Calcium: 8.9 mg/dL (ref 8.9–10.3)
Chloride: 110 mmol/L (ref 98–111)
Creatinine, Ser: 2.05 mg/dL — ABNORMAL HIGH (ref 0.44–1.00)
GFR, Estimated: 23 mL/min — ABNORMAL LOW (ref >60)
Glucose, Bld: 139 mg/dL — ABNORMAL HIGH (ref 70–99)
Potassium: 4.2 mmol/L (ref 3.5–5.1)
Sodium: 141 mmol/L (ref 135–145)

## 2021-04-06 NOTE — ED Provider Notes (Signed)
Patient has not seen and evaluated by myself as well as in conjunction with physician assistant Mardee Postin  In short patient has been experiencing what appears to be hematuria grossly for the last 2 to 3 days.  She does not associate this with any abdominal pain fever vomiting chills or other symptoms.  Her exam is very reassuring, fully awake and alert without distress.  Labs when compared to previous outpatient do demonstrate a mild reduction in GFR and creatinine, her last GFR 32 and creatinine 1.5 in approximately June, see care everywhere  Today her GFR has increased to approximately 2.  In addition to this she does report some mild trace ankle edema which is apparently new.  She does however overall not appear volume overloaded.  She stopped taking her anticoagulant on Thursday when they noticed the blood in her urine.  Returns today for ongoing gross hematuria.  Does have a very mild reduction in hemoglobin as well.  Urinalysis sent for culture, denies any infectious symptoms or dysuria.  CT   CT Renal Stone Study  Result Date: 04/06/2021 CLINICAL DATA:  Hematuria EXAM: CT ABDOMEN AND PELVIS WITHOUT CONTRAST TECHNIQUE: Multidetector CT imaging of the abdomen and pelvis was performed following the standard protocol without IV contrast. Unenhanced CT was performed per clinician order. Lack of IV contrast limits sensitivity and specificity, especially for evaluation of abdominal/pelvic solid viscera. COMPARISON:  12/20/2020 FINDINGS: Lower chest: No acute pleural or parenchymal lung disease. Stable scattered areas of subpleural scarring at the lung bases. Hepatobiliary: No focal liver abnormality is seen. Status post cholecystectomy. No biliary dilatation. Pancreas: Unremarkable. No pancreatic ductal dilatation or surrounding inflammatory changes. Spleen: Stable indeterminate hypodensities within the spleen, measuring up to 14 mm. No evidence of splenomegaly. Adrenals/Urinary Tract: No urinary tract  calculi or obstructive uropathy. The adrenals and bladder are grossly unremarkable. Stomach/Bowel: No bowel obstruction or ileus. There is diffuse colonic diverticulosis without evidence of diverticulitis. Normal appendix right lower quadrant. Vascular/Lymphatic: Diffuse atherosclerosis throughout the abdominal aorta and its branches. There is a single borderline enlarged right inguinal lymph node measuring 2.1 x 1.4 cm reference image 89/2. Numerous other subcentimeter lymph nodes are seen within the bilateral inguinal regions and retroperitoneum, nonspecific. No other pathologic adenopathy. Reproductive: Uterus and bilateral adnexa are unremarkable. Other: No free fluid or free gas. Small fat containing umbilical hernia. No bowel herniation. Musculoskeletal: No acute or destructive bony lesions. Reconstructed images demonstrate no additional findings. IMPRESSION: 1. No urinary tract calculi or obstructive uropathy. No CT etiology to explain hematuria. 2. Colonic diverticulosis without diverticulitis. 3. Borderline enlarged right inguinal lymph node, nonspecific. No other pathologic adenopathy. 4. Stable indeterminate splenic hypodensities, statistically likely benign hemangioma or cyst. 5.  Aortic Atherosclerosis (ICD10-I70.0). Electronically Signed   By: Randa Ngo M.D.   On: 04/06/2021 16:27     Imaging results reviewed by me.  Discussed with patient.  Having reviewed the patient's labs with a very minimal decrease in hemoglobin as well as what appears to be relatively stable creatinine and renal status.  I discussed the patient's case with (Dr. Theador Hawthorne) and having discussed see advised that seems to have 2 reasonable options 1 reassurance for the patient with follow-up and holding Eliquis further, or to potential observation overnight.  I discussed and with shared medical decision making discussed option of observation for reevaluation of a kidney testing and observation for further or worsening  bleeding with the patient as well as her daughter, patient feels that she has excellent care at  her current facility and does not wish to stay for observation.  I think is quite reasonable she is hemodynamically stable her hemoglobin is minimally changed from previous, her renal function appears to be stable and review of old more historical labs with nephrology MD advising renal function appears quite stable.  Return precautions and treatment recommendations and follow-up discussed with the patient who is agreeable with the plan.    Delman Kitten, MD 04/06/21 [phone]

## 2021-04-06 NOTE — Discharge Instructions (Signed)
Continue to stop Eliquis. Please call Dr. Ola Spurr Monday to discuss future plan for this medication.

## 2021-04-06 NOTE — ED Triage Notes (Signed)
Pt to ED from [place] for hematuria. Was treated yesterday for same. Was told to stop blood thinner for 3 days. Has had one episode of hematuria today. Denies pain with urination Has not yet followed up with urology  Pt in NAD

## 2021-04-06 NOTE — ED Notes (Signed)
See triage note- pt denies pain at this time, states she had bright red urine output at ALF today, did not note any clots today but had some clots yesterday. Pt is AOX4, NAD noted. Bruising noted on arms, pt states they were from blood draws from ER visit this week.

## 2021-04-06 NOTE — ED Notes (Signed)
Attempted a phlebotomy stick and was unsuccessful. Lab called to come and get lab work.

## 2021-04-08 LAB — URINE CULTURE: Culture: <10000 — AB

## 2021-04-10 ENCOUNTER — Ambulatory Visit: Payer: Medicare HMO | Admitting: Oncology

## 2021-04-16 ENCOUNTER — Other Ambulatory Visit: Payer: Self-pay

## 2021-04-16 ENCOUNTER — Encounter: Payer: Self-pay | Admitting: Oncology

## 2021-04-16 ENCOUNTER — Inpatient Hospital Stay (HOSPITAL_BASED_OUTPATIENT_CLINIC_OR_DEPARTMENT_OTHER): Payer: Medicare HMO | Admitting: Oncology

## 2021-04-16 ENCOUNTER — Inpatient Hospital Stay: Payer: Medicare HMO

## 2021-04-16 VITALS — BP 128/35 | HR 49 | Temp 97.3°F | Resp 18 | Wt 169.8 lb

## 2021-04-16 DIAGNOSIS — R599 Enlarged lymph nodes, unspecified: Secondary | ICD-10-CM | POA: Diagnosis not present

## 2021-04-16 DIAGNOSIS — C8580 Other specified types of non-Hodgkin lymphoma, unspecified site: Secondary | ICD-10-CM

## 2021-04-16 DIAGNOSIS — Z8572 Personal history of non-Hodgkin lymphomas: Secondary | ICD-10-CM | POA: Diagnosis not present

## 2021-04-16 LAB — LACTATE DEHYDROGENASE: LDH: 321 U/L — ABNORMAL HIGH (ref 98–192)

## 2021-04-16 LAB — CBC
HCT: 33.2 % — ABNORMAL LOW (ref 36.0–46.0)
Hemoglobin: 9.9 g/dL — ABNORMAL LOW (ref 12.0–15.0)
MCH: 28.9 pg (ref 26.0–34.0)
MCHC: 29.8 g/dL — ABNORMAL LOW (ref 30.0–36.0)
MCV: 97.1 fL (ref 80.0–100.0)
Platelets: 158 10*3/uL (ref 150–400)
RBC: 3.42 MIL/uL — ABNORMAL LOW (ref 3.87–5.11)
RDW: 15.8 % — ABNORMAL HIGH (ref 11.5–15.5)
WBC: 3 10*3/uL — ABNORMAL LOW (ref 4.0–10.5)
nRBC: 0 % (ref 0.0–0.2)

## 2021-04-16 LAB — FERRITIN: Ferritin: 37 ng/mL (ref 11–307)

## 2021-04-16 LAB — IRON AND TIBC
Iron: 40 ug/dL (ref 28–170)
Saturation Ratios: 12 % (ref 10.4–31.8)
TIBC: 329 ug/dL (ref 250–450)
UIBC: 289 ug/dL

## 2021-04-17 ENCOUNTER — Ambulatory Visit (INDEPENDENT_AMBULATORY_CARE_PROVIDER_SITE_OTHER): Payer: Medicare HMO | Admitting: Urology

## 2021-04-17 ENCOUNTER — Encounter: Payer: Self-pay | Admitting: Urology

## 2021-04-17 VITALS — BP 126/62 | HR 53 | Ht 62.0 in | Wt 170.0 lb

## 2021-04-17 DIAGNOSIS — R310 Gross hematuria: Secondary | ICD-10-CM | POA: Diagnosis not present

## 2021-04-17 DIAGNOSIS — N3941 Urge incontinence: Secondary | ICD-10-CM

## 2021-04-17 NOTE — Progress Notes (Signed)
04/17/2021 5:02 PM   [place] 01/31/1935 [id number]  Referring provider: Leonel Ramsay, MD [place],  [place] [address]  Chief Complaint  Patient presents with   Hematuria    HPI: Penny White is an 86 y.o. female who presents for evaluation of gross hematuria.  She presents today with her daughter-in-law.  Initially presented to [facility] ED 04/05/2021 with a 4-hour history of gross hematuria She states there was a fair amount of blood with clots Denied dysuria, flank or abdominal pain On Eliquis for atrial fibrillation She had a stable hematocrit, BUN and creatinine and it was elected to hold Eliquis x3 days Urine culture ordered which was negative Return to the ED the following day with persistent, intermittent gross hematuria A noncontrast CT was performed which showed no genitourinary abnormalities including hydronephrosis, calculi or renal/bladder mass Repeat urine culture was negative Her hematuria resolved and has not recurred Followed by nephrology for chronic kidney disease stage III Urine cultures 04/10/2021 grew Citrobacter and she was started on Septra DS today though no real UTI symptoms She does have baseline urgency and urge incontinence   PMH: Past Medical History:  Diagnosis Date   Arthritis    Atherosclerosis    Atrial fibrillation (HCC)    CHF (congestive heart failure) (HCC)    Dementia (HCC)    Gout    Hyperlipemia    Hypertension    Hypothyroid    Kidney disease, chronic, stage III (moderate, EGFR 30-59 ml/min) (HCC)    now stage 4   Small cell B-cell lymphoma, unspecified site ([place])    Squamous cell carcinoma of skin    L arm removed by Dr Phillip Heal    Stroke [facility]) 2018   + 2021.  Peripheral vision damage.   Subdural hemorrhage ([place]) 2020    Surgical History: Past Surgical History:  Procedure Laterality Date   CARDIAC CATHETERIZATION     CATARACT EXTRACTION W/PHACO Left 12/04/2020   Procedure: CATARACT  EXTRACTION PHACO AND INTRAOCULAR LENS PLACEMENT (IOC) LEFT 6.95 00:45.9;  Surgeon: Birder Robson, MD;  Location: [place];  Service: Ophthalmology;  Laterality: Left;   CATARACT EXTRACTION W/PHACO Right 12/18/2020   Procedure: CATARACT EXTRACTION PHACO AND INTRAOCULAR LENS PLACEMENT (IOC) RIGHT 7.07 00:42.9;  Surgeon: Birder Robson, MD;  Location: [place];  Service: Ophthalmology;  Laterality: Right;   CHOLECYSTECTOMY  1978   REPLACEMENT TOTAL KNEE Bilateral    Right 06/02/03,  left 04/20/06   TUBAL LIGATION      Home Medications:  Allergies as of 04/17/2021       Reactions   Ciprofloxacin Itching   Novocain [procaine]    Doesn't remember reaction        Medication List        Accurate as of April 17, 2021  [place] PM. If you have any questions, ask your nurse or doctor.          apixaban 5 MG Tabs tablet Commonly known as: ELIQUIS Take by mouth.   levothyroxine 50 MCG tablet Commonly known as: SYNTHROID Take 50 mcg by mouth daily before breakfast.   losartan 25 MG tablet Commonly known as: COZAAR Take 25 mg by mouth daily.   predniSONE 5 MG tablet Commonly known as: DELTASONE Take 5 mg by mouth daily with breakfast.   rosuvastatin 40 MG tablet Commonly known as: CRESTOR Take 40 mg by mouth daily.        Allergies:  Allergies  Allergen Reactions  Ciprofloxacin Itching   Novocain [Procaine]     Doesn't remember reaction    Family History: History reviewed. No pertinent family history.  Social History:  reports that she has never smoked. She has never used smokeless tobacco. She reports previous alcohol use. She reports previous drug use.   Physical Exam: BP 126/62   Pulse (!) 53   Ht _0  (1.575 m)   Wt 170 lb (77.1 kg)   BMI 31.09 kg/m   Constitutional:  Alert, No acute distress. HEENT: [place] AT, moist mucus membranes.  Trachea midline, no masses. Cardiovascular: No clubbing, cyanosis, or edema. Respiratory: Normal  respiratory effort, no increased work of breathing. Skin: No rashes, bruises or suspicious lesions. Neurologic: Grossly intact, no focal deficits, moving all 4 extremities. Psychiatric: Normal mood and affect.  Laboratory Data:  Urinalysis Dipstick 3+ blood/3+ protein/1+ leukocyte/nitrite positive Microscopy >30 WBC/0 RBC/no significant vaginal epis   Pertinent Imaging: CT images 04/06/2021 were personally reviewed and interpreted  CT Renal Stone Study  Narrative CLINICAL DATA:  Hematuria  EXAM: CT ABDOMEN AND PELVIS WITHOUT CONTRAST  TECHNIQUE: Multidetector CT imaging of the abdomen and pelvis was performed following the standard protocol without IV contrast. Unenhanced CT was performed per clinician order. Lack of IV contrast limits sensitivity and specificity, especially for evaluation of abdominal/pelvic solid viscera.  COMPARISON:  12/20/2020  FINDINGS: Lower chest: No acute pleural or parenchymal lung disease. Stable scattered areas of subpleural scarring at the lung bases.  Hepatobiliary: No focal liver abnormality is seen. Status post cholecystectomy. No biliary dilatation.  Pancreas: Unremarkable. No pancreatic ductal dilatation or surrounding inflammatory changes.  Spleen: Stable indeterminate hypodensities within the spleen, measuring up to 14 mm. No evidence of splenomegaly.  Adrenals/Urinary Tract: No urinary tract calculi or obstructive uropathy. The adrenals and bladder are grossly unremarkable.  Stomach/Bowel: No bowel obstruction or ileus. There is diffuse colonic diverticulosis without evidence of diverticulitis. Normal appendix right lower quadrant.  Vascular/Lymphatic: Diffuse atherosclerosis throughout the abdominal aorta and its branches. There is a single borderline enlarged right inguinal lymph node measuring 2.1 x 1.4 cm reference image 89/2. Numerous other subcentimeter lymph nodes are seen within the bilateral inguinal regions and  retroperitoneum, nonspecific. No other pathologic adenopathy.  Reproductive: Uterus and bilateral adnexa are unremarkable.  Other: No free fluid or free gas. Small fat containing umbilical hernia. No bowel herniation.  Musculoskeletal: No acute or destructive bony lesions. Reconstructed images demonstrate no additional findings.  IMPRESSION: 1. No urinary tract calculi or obstructive uropathy. No CT etiology to explain hematuria. 2. Colonic diverticulosis without diverticulitis. 3. Borderline enlarged right inguinal lymph node, nonspecific. No other pathologic adenopathy. 4. Stable indeterminate splenic hypodensities, statistically likely benign hemangioma or cyst. 5.  Aortic Atherosclerosis (ICD10-I70.0).   Electronically Signed By: Randa Ngo M.D. On: 04/06/2021 16:27   Assessment & Plan:    1. Gross hematuria AUA hematuria risk stratification: High We discussed the standard recommend evaluation for high risk hematuria to include renal imaging and cystoscopy.  CT urogram cannot be performed secondary to chronic kidney disease If cystoscopy unremarkable and she continues with gross hematuria would recommend MR urography Urine culture most likely represents colonization    Abbie Sons, MD  [place] [address] [place], [place] [address] [phone]

## 2021-04-18 LAB — URINALYSIS, COMPLETE
Bilirubin, UA: NEGATIVE
Glucose, UA: NEGATIVE
Ketones, UA: NEGATIVE
Nitrite, UA: POSITIVE — AB
Specific Gravity, UA: 1.025 (ref 1.005–1.030)
Urobilinogen, Ur: 0.2 mg/dL (ref 0.2–1.0)
pH, UA: 6 (ref 5.0–7.5)

## 2021-04-18 LAB — MICROSCOPIC EXAMINATION
RBC, Urine: NONE SEEN /hpf (ref 0–2)
WBC, UA: >30 /hpf — ABNORMAL HIGH (ref 0–5)

## 2021-04-18 NOTE — Progress Notes (Signed)
Hematology/Oncology Consult note [facility]  Telephone:([phone] [phone]  Patient Care Team: Leonel Ramsay, MD as PCP - General (Infectious Diseases)   Name of the patient: Penny White  [id number]  10/15/1934   Date of visit: 04/18/21  Diagnosis- 1.  History of marginal zone lymphoma 2.  Splenic lesions likely benign  Chief complaint/ Reason for visit-acute visit for concerns of lymphadenopathy  Heme/Onc history: Patient is a 86 year old female with a past medical history significant for chronic atrial fibrillation, heart failure, B12 deficiency, CVA among other medical problems.  She also has a diagnosis of marginal zone lymphoma for which she was on Bendamustine Rituxan and then maintenance Rituxan up until August 2019.  She then had progression on rituximab and received cycle 6 of obinutuzumab in July 2020 along with Revlimid.  Revlimid was discontinued in April 2020 due to bradycardia.  She has also received IVIG in the past but none since October 2021.  Her last CT chest abdomen pelvis with contrast in September 2020 showed resolution of prior adenopathy.  Scattered subcentimeter lung nodules which were favored to be infectious/inflammatory in etiology.  She was also noted to have multifocal splenic lesions largest measuring 14 mm which was compared to prior scans from April 2020 as well as PET scan from January 2020 and were found to be stable.  She has been presently referred to [place] for splenic hypodensities.  The splenic hypodensities were not FDG avid on prior PET.    Interval history- Patient was recently in the ER for symptoms of hematuria she had a CT renal stone study done on 04/06/2021 which showed a single borderline right inguinal lymph node 2.1 x 1.4 cm thought to be nonspecific.  No acute etiology for hematuria was found.  She was seen by Dr. Bernardo Heater and likely to get cystoscopy soon.  Patient reports feeling palpable lymph  nodes the back of her head as well as around her neck.  She has noticed this in the last 2 months or so.  ECOG PS- 2-3 Pain scale- 0   Review of systems- Review of Systems  Constitutional:  Positive for malaise/fatigue. Negative for chills, fever and weight loss.  HENT:  Negative for congestion, ear discharge and nosebleeds.   Eyes:  Negative for blurred vision.  Respiratory:  Negative for cough, hemoptysis, sputum production, shortness of breath and wheezing.   Cardiovascular:  Negative for chest pain, palpitations, orthopnea and claudication.  Gastrointestinal:  Negative for abdominal pain, blood in stool, constipation, diarrhea, heartburn, melena, nausea and vomiting.  Genitourinary:  Negative for dysuria, flank pain, frequency, hematuria and urgency.  Musculoskeletal:  Negative for back pain, joint pain and myalgias.  Skin:  Negative for rash.  Neurological:  Negative for dizziness, tingling, focal weakness, seizures, weakness and headaches.  Endo/Heme/Allergies:  Does not bruise/bleed easily.  Psychiatric/Behavioral:  Negative for depression and suicidal ideas. The patient does not have insomnia.       Allergies  Allergen Reactions   Ciprofloxacin Itching   Novocain [Procaine]     Doesn't remember reaction     Past Medical History:  Diagnosis Date   Arthritis    Atherosclerosis    Atrial fibrillation (HCC)    CHF (congestive heart failure) (HCC)    Dementia (HCC)    Gout    Hyperlipemia    Hypertension    Hypothyroid    Kidney disease, chronic, stage III (moderate, EGFR 30-59 ml/min) (HCC)    now stage 4  Small cell B-cell lymphoma, unspecified site ([place])    Squamous cell carcinoma of skin    L arm removed by Dr Phillip Heal    Stroke [facility]) 2018   + 2021.  Peripheral vision damage.   Subdural hemorrhage ([place]) 2020     Past Surgical History:  Procedure Laterality Date   CARDIAC CATHETERIZATION     CATARACT EXTRACTION W/PHACO Left 12/04/2020   Procedure: CATARACT  EXTRACTION PHACO AND INTRAOCULAR LENS PLACEMENT (IOC) LEFT 6.95 00:45.9;  Surgeon: Birder Robson, MD;  Location: [place];  Service: Ophthalmology;  Laterality: Left;   CATARACT EXTRACTION W/PHACO Right 12/18/2020   Procedure: CATARACT EXTRACTION PHACO AND INTRAOCULAR LENS PLACEMENT (IOC) RIGHT 7.07 00:42.9;  Surgeon: Birder Robson, MD;  Location: [place];  Service: Ophthalmology;  Laterality: Right;   CHOLECYSTECTOMY  1978   REPLACEMENT TOTAL KNEE Bilateral    Right 06/02/03,  left 04/20/06   TUBAL LIGATION      Social History   Socioeconomic History   Marital status: Widowed    Spouse name: Not on file   Number of children: Not on file   Years of education: Not on file   Highest education level: Not on file  Occupational History   Not on file  Tobacco Use   Smoking status: Never   Smokeless tobacco: Never  Vaping Use   Vaping Use: Never used  Substance and Sexual Activity   Alcohol use: Not Currently   Drug use: Not Currently   Sexual activity: Not Currently  Other Topics Concern   Not on file  Social History Narrative   Not on file   Social Determinants of Health   Financial Resource Strain: Not on file  Food Insecurity: Not on file  Transportation Needs: Not on file  Physical Activity: Not on file  Stress: Not on file  Social Connections: Not on file  Intimate Partner Violence: Not on file    History reviewed. No pertinent family history.   Current Outpatient Medications:    apixaban (ELIQUIS) 5 MG TABS tablet, Take by mouth., Disp: , Rfl:    levothyroxine (SYNTHROID) 50 MCG tablet, Take 50 mcg by mouth daily before breakfast., Disp: , Rfl:    losartan (COZAAR) 25 MG tablet, Take 25 mg by mouth daily., Disp: , Rfl:    predniSONE (DELTASONE) 5 MG tablet, Take 5 mg by mouth daily with breakfast., Disp: , Rfl:    rosuvastatin (CRESTOR) 40 MG tablet, Take 40 mg by mouth daily., Disp: , Rfl:   Physical exam:  Vitals:   04/16/21 1432   BP: (!) 128/35  Pulse: (!) 49  Resp: 18  Temp: (!) 97.3 F (36.3 C)  TempSrc: Tympanic  SpO2: 100%  Weight: 169 lb 12.8 oz (77 kg)   Physical Exam Constitutional:      Comments: Elderly frail woman sitting in a wheelchair.  Appears in no acute distress  Cardiovascular:     Rate and Rhythm: Normal rate and regular rhythm.     Heart sounds: Normal heart sounds.  Pulmonary:     Effort: Pulmonary effort is normal.     Breath sounds: Normal breath sounds.  Abdominal:     General: Bowel sounds are normal.     Palpations: Abdomen is soft.     Comments: No palpable hepatosplenomegaly.  No palpable inguinal adenopathy  Lymphadenopathy:     Comments: There is palpable suboccipitally adenopathy as well as palpable lymph node in the midline of the neck.  Skin:  General: Skin is warm and dry.  Neurological:     Mental Status: She is alert and oriented to person, place, and time.     CMP Latest Ref Rng & Units 04/06/2021  Glucose 70 - 99 mg/dL 139(H)  BUN 8 - 23 mg/dL 31(H)  Creatinine 0.44 - 1.00 mg/dL 2.05(H)  Sodium 135 - 145 mmol/L 141  Potassium 3.5 - 5.1 mmol/L 4.2  Chloride 98 - 111 mmol/L 110  CO2 22 - 32 mmol/L 25  Calcium 8.9 - 10.3 mg/dL 8.9  Total Protein 6.5 - 8.1 g/dL -  Total Bilirubin 0.3 - 1.2 mg/dL -  Alkaline Phos 38 - 126 U/L -  AST 15 - 41 U/L -  ALT 0 - 44 U/L -   CBC Latest Ref Rng & Units 04/16/2021  WBC 4.0 - 10.5 K/uL 3.0(L)  Hemoglobin 12.0 - 15.0 g/dL 9.9(L)  Hematocrit 36.0 - 46.0 % 33.2(L)  Platelets 150 - 400 K/uL 158    No images are attached to the encounter.  CT Renal Stone Study  Result Date: 04/06/2021 CLINICAL DATA:  Hematuria EXAM: CT ABDOMEN AND PELVIS WITHOUT CONTRAST TECHNIQUE: Multidetector CT imaging of the abdomen and pelvis was performed following the standard protocol without IV contrast. Unenhanced CT was performed per clinician order. Lack of IV contrast limits sensitivity and specificity, especially for evaluation of  abdominal/pelvic solid viscera. COMPARISON:  12/20/2020 FINDINGS: Lower chest: No acute pleural or parenchymal lung disease. Stable scattered areas of subpleural scarring at the lung bases. Hepatobiliary: No focal liver abnormality is seen. Status post cholecystectomy. No biliary dilatation. Pancreas: Unremarkable. No pancreatic ductal dilatation or surrounding inflammatory changes. Spleen: Stable indeterminate hypodensities within the spleen, measuring up to 14 mm. No evidence of splenomegaly. Adrenals/Urinary Tract: No urinary tract calculi or obstructive uropathy. The adrenals and bladder are grossly unremarkable. Stomach/Bowel: No bowel obstruction or ileus. There is diffuse colonic diverticulosis without evidence of diverticulitis. Normal appendix right lower quadrant. Vascular/Lymphatic: Diffuse atherosclerosis throughout the abdominal aorta and its branches. There is a single borderline enlarged right inguinal lymph node measuring 2.1 x 1.4 cm reference image 89/2. Numerous other subcentimeter lymph nodes are seen within the bilateral inguinal regions and retroperitoneum, nonspecific. No other pathologic adenopathy. Reproductive: Uterus and bilateral adnexa are unremarkable. Other: No free fluid or free gas. Small fat containing umbilical hernia. No bowel herniation. Musculoskeletal: No acute or destructive bony lesions. Reconstructed images demonstrate no additional findings. IMPRESSION: 1. No urinary tract calculi or obstructive uropathy. No CT etiology to explain hematuria. 2. Colonic diverticulosis without diverticulitis. 3. Borderline enlarged right inguinal lymph node, nonspecific. No other pathologic adenopathy. 4. Stable indeterminate splenic hypodensities, statistically likely benign hemangioma or cyst. 5.  Aortic Atherosclerosis (ICD10-I70.0). Electronically Signed   By: Randa Ngo M.D.   On: 04/06/2021 16:27     Assessment and plan- Patient is a 86 y.o. female with history of marginal zone  lymphoma treated at [facility] in the past.  This is an acute visit for ongoing concerns of lymphadenopathy.    When patient had her marginal zone lymphoma progression in April 2019 she had diffuse adenopathy noted in the neck chest and abdomen.  Presently she has palpable suboccipitally as well as adenopathy in the's midline of her neck.  Also on basis of her labs today her LDH is elevated at 321 as compared to 223 prior.  Given her prior history of marginal zone lymphoma and palpable adenopathy I would like to proceed with a PET CT scan at this  time and based on the results of the PET CT scan we will proceed with a biopsy.  I will see the patient back after PET and biopsy results are back  Patient's baseline hemoglobin is around 11 and presently it is down to 9.9.  Possibly secondary to hematuria.  Check ferritin and iron studies today   Visit Diagnosis 1. Marginal zone B-cell lymphoma (HCC)   2. Enlarged lymph nodes      Dr. Randa Evens, MD, MPH [facility] [phone] 04/18/2021 8:18 AM

## 2021-04-19 ENCOUNTER — Encounter: Payer: Self-pay | Admitting: Urology

## 2021-04-21 LAB — CULTURE, URINE COMPREHENSIVE

## 2021-04-22 ENCOUNTER — Ambulatory Visit
Admission: RE | Admit: 2021-04-22 | Discharge: 2021-04-22 | Disposition: A | Discharge status: Home / Self Care | Payer: Medicare HMO | Source: Ambulatory Visit | Attending: Oncology | Admitting: Oncology

## 2021-04-22 ENCOUNTER — Other Ambulatory Visit: Payer: Self-pay

## 2021-04-22 ENCOUNTER — Telehealth: Payer: Self-pay | Admitting: *Deleted

## 2021-04-22 DIAGNOSIS — K449 Diaphragmatic hernia without obstruction or gangrene: Secondary | ICD-10-CM | POA: Diagnosis not present

## 2021-04-22 DIAGNOSIS — I517 Cardiomegaly: Secondary | ICD-10-CM | POA: Insufficient documentation

## 2021-04-22 DIAGNOSIS — C8580 Other specified types of non-Hodgkin lymphoma, unspecified site: Secondary | ICD-10-CM | POA: Diagnosis not present

## 2021-04-22 DIAGNOSIS — K5730 Diverticulosis of large intestine without perforation or abscess without bleeding: Secondary | ICD-10-CM | POA: Diagnosis not present

## 2021-04-22 DIAGNOSIS — R590 Localized enlarged lymph nodes: Secondary | ICD-10-CM | POA: Diagnosis not present

## 2021-04-22 DIAGNOSIS — R599 Enlarged lymph nodes, unspecified: Secondary | ICD-10-CM

## 2021-04-22 DIAGNOSIS — I2510 Atherosclerotic heart disease of native coronary artery without angina pectoris: Secondary | ICD-10-CM | POA: Insufficient documentation

## 2021-04-22 LAB — GLUCOSE, CAPILLARY: Glucose-Capillary: 117 mg/dL — ABNORMAL HIGH (ref 70–99)

## 2021-04-22 MED ORDER — FLUDEOXYGLUCOSE F - 18 (FDG) INJECTION
8.7000 | Freq: Once | INTRAVENOUS | Status: AC | PRN
  Administered 2021-04-22: 8.89 via INTRAVENOUS

## 2021-04-22 NOTE — Telephone Encounter (Signed)
Notified patient son as instructed, patient pleased.

## 2021-04-22 NOTE — Telephone Encounter (Signed)
-----   Message from Abbie Sons, MD sent at 04/21/2021  9:22 AM EDT ----- Urine culture was positive and sensitive to Septra.  Please send an order to her assisted living facility to start taking the medication 1 tablet twice daily.  I believe Dr. Juleen China gave them a prescription.  Check with her son or daughter-in-law as per the last MyChart message.

## 2021-04-24 ENCOUNTER — Encounter: Payer: Self-pay | Admitting: Oncology

## 2021-04-24 ENCOUNTER — Other Ambulatory Visit: Payer: Self-pay | Admitting: Oncology

## 2021-04-24 ENCOUNTER — Telehealth: Payer: Self-pay | Admitting: *Deleted

## 2021-04-24 NOTE — Telephone Encounter (Signed)
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 Adenopathy in the neck with history of lymphoma from years ago.   EXAM: NUCLEAR MEDICINE PET SKULL BASE TO THIGH   TECHNIQUE: 8.9 mCi F-18 FDG was injected intravenously. Full-ring PET imaging was performed from the skull base to thigh after the radiotracer. CT data was obtained and used for attenuation correction and anatomic localization.   Fasting blood glucose: 117 mg/dl   COMPARISON:  CT abdomen 04/06/2021   FINDINGS: Mediastinal blood pool activity: SUV max 3.3   Liver activity: SUV max 4.2   NECK: Bilateral parotid, occipital, periauricular, and level Ia, Ib, II, III, and level V hypermetabolic adenopathy is observed. Index right occipital node 1.4 cm in short axis on image 27 series 3, maximum SUV 5.6 (Deauville 4). Index right level IIa lymph node 1.4 cm in short axis on image 40 series 3, maximum SUV 5.5 (Deauville 4).   Dental activity is noted bilaterally.   Incidental CT findings: Probable sebaceous cyst along the junction of the neck and upper back eccentric to the right on image 39 of series 3 measuring 2.8 by 1.9 cm, photopenic. Prior left PCA distribution stroke and possible interval right PCA distribution stroke.   CHEST: Scattered hypermetabolic axillary, subpectoral, AP window, and subcutaneous adenopathy in the chest. Index right lower axial lymph node 1.9 cm in short axis on image 99 of series 3, maximum SUV 5.5 (Deauville 4). Index AP window lymph node 1.3 cm in short axis on image 78 series 3, maximum SUV 3.3 (Deauville 2). A subcutaneous lymph node posterior to the left shoulder measures 1.1 cm in short axis on image 61 series 3 with maximum SUV 3.0 (Deauville 2).   Left paraspinal soft tissue density is observed at the T8, T9, and T10 levels and could be from  lymphoma or extramedullary hematopoiesis. At T8 this measures 0.9 cm in thickness (image 98, series 3) and has a maximum SUV of 3.3 (Deauville 2).   Incidental CT findings: Coronary, aortic arch, and branch vessel atherosclerotic vascular disease. Mild cardiomegaly. Small type 1 hiatal hernia. Possible emphysema. Airway thickening is present, suggesting bronchitis or reactive airways disease. Mild scarring or atelectasis in the lingula and both lower lobes.   ABDOMEN/PELVIS: Portacaval lymph node 1.3 cm in short axis on image 132 series 3, maximum SUV 4.7, Deauville 4. No current splenomegaly or hypermetabolic activity in the spleen. Small periaortic lymph nodes have an activity similar to that of blood pool. A right inguinal lymph node measuring 1.3 cm in short axis on image 232 of series 3 has a maximum SUV of 3.8 (Deauville 3).   Along the left thoracoabdominal wall in just deep to the lower part of the latissimus dorsi muscle, a soft tissue density lesion measuring 2.7 by 1.1 cm on image 120 series 3 presumably represents an enlarged lymph node and has a maximum SUV of 3.4 (Deauville 3).   Incidental CT findings: Aortoiliac atherosclerotic vascular disease. Scattered colonic diverticulosis. This is most concentrated in the sigmoid colon.   SKELETON: No significant abnormal hypermetabolic activity in this region.   Incidental CT findings: Mild left lateral lower rib deformities likely from old fractures. Prominent degenerative glenohumeral arthropathy on the left.   IMPRESSION: 1. Substantial hypermetabolic adenopathy in the neck, chest, and to a lesser extent in the abdomen and pelvis, compatible with active lymphoma.  Neck activity is primarily Deauville 4. Chest activity varies from Deauville 2 through Deauville 4. Abdominal activity is Deauville 4 and Deauville 3. There are a few scattered atypical lymph nodes for example in the subcutaneous tissues. 2. Deauville 2 left  paraspinal soft tissue density at the T8, T9, and T10 levels potentially from heterotopic ossification or tumor deposition. 3. Chronic left occipital infarct with hypodensity in the right occipital region which may reflect a more recent occipital infarct, correlate with patient history. 4. Other imaging findings of potential clinical significance: Aortic Atherosclerosis (ICD10-I70.0). Coronary atherosclerosis. Mild cardiomegaly. Small type 1 hiatal hernia. Possible emphysema. Airway thickening is present, suggesting bronchitis or reactive airways disease. Mild bibasilar scarring. Colonic diverticulosis.     Electronically Signed   By: Van Clines M.D.   On: 04/23/2021 14:16

## 2021-04-25 ENCOUNTER — Other Ambulatory Visit: Payer: Self-pay | Admitting: *Deleted

## 2021-04-25 DIAGNOSIS — C8580 Other specified types of non-Hodgkin lymphoma, unspecified site: Secondary | ICD-10-CM

## 2021-04-25 DIAGNOSIS — R591 Generalized enlarged lymph nodes: Secondary | ICD-10-CM

## 2021-04-26 ENCOUNTER — Other Ambulatory Visit: Payer: Self-pay

## 2021-04-26 ENCOUNTER — Encounter: Payer: Self-pay | Admitting: Urology

## 2021-04-26 ENCOUNTER — Ambulatory Visit (INDEPENDENT_AMBULATORY_CARE_PROVIDER_SITE_OTHER): Payer: Medicare HMO | Admitting: Urology

## 2021-04-26 VITALS — BP 97/58 | HR 69 | Ht 62.0 in | Wt 167.0 lb

## 2021-04-26 DIAGNOSIS — R310 Gross hematuria: Secondary | ICD-10-CM | POA: Diagnosis not present

## 2021-04-26 LAB — MICROSCOPIC EXAMINATION

## 2021-04-26 LAB — URINALYSIS, COMPLETE
Bilirubin, UA: NEGATIVE
Glucose, UA: NEGATIVE
Ketones, UA: NEGATIVE
Nitrite, UA: NEGATIVE
Specific Gravity, UA: 1.025 (ref 1.005–1.030)
Urobilinogen, Ur: 1 mg/dL (ref 0.2–1.0)
pH, UA: 6.5 (ref 5.0–7.5)

## 2021-04-26 NOTE — Progress Notes (Signed)
   04/26/21  CC:  Chief Complaint  Patient presents with   Cysto    HPI: Refer to my prior note 04/17/2021.  Urine culture was positive and she did start Septra that was prescribed by Dr. Holley Raring.  Denies recurrent hematuria  Blood pressure (!) 97/58, pulse 69, height 5\' 2"  (1.575 m), weight 167 lb (75.8 kg). NED. A&Ox3.   No respiratory distress   Abd soft, NT, ND Atrophic external genitalia with patent urethral meatus  Cystoscopy Procedure Note  Patient identification was confirmed, informed consent was obtained, and patient was prepped using Betadine solution.  Lidocaine jelly was administered per urethral meatus.    Procedure: - Flexible cystoscope introduced, without any difficulty.   - Thorough search of the bladder revealed:    normal urethral meatus    normal urothelium    no stones    no ulcers     no tumors    no urethral polyps    no trabeculation  - Ureteral orifices were normal in position and appearance.  Post-Procedure: - Patient tolerated the procedure well  Assessment/ Plan: No bladder mucosal abnormalities Noncontrast CT showed no upper tract abnormalities Instructed to call for recurrent gross hematuria Urine cytology sent MR urogram or bilateral retrograde pyelography for recurrent gross hematuria    Abbie Sons, MD

## 2021-04-28 ENCOUNTER — Encounter: Payer: Self-pay | Admitting: Urology

## 2021-04-29 LAB — CYTOLOGY - NON PAP

## 2021-04-30 ENCOUNTER — Telehealth: Payer: Self-pay | Admitting: *Deleted

## 2021-04-30 ENCOUNTER — Telehealth: Payer: Self-pay | Admitting: Family Medicine

## 2021-04-30 NOTE — Telephone Encounter (Signed)
Left voice mail with Marcello Moores informing him of the message below for his mother.

## 2021-04-30 NOTE — Telephone Encounter (Signed)
Called [facility] and asked if Dr. Ola Spurr will give clearance letter to hold eliquis for 5 days and on 6th day - day of te biopsy she will start back the eliquis that evening. Asked for letter asap

## 2021-04-30 NOTE — Telephone Encounter (Signed)
-----   Message from Abbie Sons, MD sent at 04/30/2021 12:09 PM EDT ----- Disregard my last message.  Her urine cytology showed no evidence of cells suspicious for bladder cancer

## 2021-05-01 ENCOUNTER — Encounter: Payer: Self-pay | Admitting: Oncology

## 2021-05-02 ENCOUNTER — Telehealth: Payer: Self-pay | Admitting: *Deleted

## 2021-05-02 NOTE — Telephone Encounter (Signed)
Called son's phone and wife answered and she says she is also POA. Her son has been wanting to know when the bx will be done and why she has to come off eliquis. I let daughter in law know that it took few days to see who is the ordering doctor for eliquis and then send a message to get permission for the drug to stop for 5 days and then the day of the biopsy she will start back that evening after she gets biopsy. We now have the letter for holding eliquis. I have snet it to Interventional radiology and they have it and the case is suppose to be determined when it can be done byt IR MD. Hopefully we will have an answer today or tom. And form that date she will stop 5 days. Family member says that she is in SNF and they will need orders to hold plavix and when to start back. I can do that order and send the note from PCP to stop for procedure to the SNF. She will update her husband when he comes back home

## 2021-05-02 NOTE — Telephone Encounter (Signed)
Called the son's home and spoke to his wife about status of bx

## 2021-05-03 ENCOUNTER — Encounter: Payer: Self-pay | Admitting: Oncology

## 2021-05-03 NOTE — Progress Notes (Signed)
Patient on schedule for Cervical Ln biopsy 05/07/2021, called and spoke with son who is POA with instructions given. Made aware to be here @ 1300 since no sedation. Holding Eliquis as of 8/11 per son and cardiologist who gave clearance. Stated understanding.

## 2021-05-06 ENCOUNTER — Other Ambulatory Visit: Payer: Self-pay | Admitting: Radiology

## 2021-05-07 ENCOUNTER — Ambulatory Visit
Admission: RE | Admit: 2021-05-07 | Discharge: 2021-05-07 | Disposition: A | Discharge status: Home / Self Care | Payer: Medicare HMO | Source: Ambulatory Visit | Attending: Oncology | Admitting: Oncology

## 2021-05-07 ENCOUNTER — Other Ambulatory Visit: Payer: Self-pay

## 2021-05-07 DIAGNOSIS — R590 Localized enlarged lymph nodes: Secondary | ICD-10-CM | POA: Insufficient documentation

## 2021-05-07 DIAGNOSIS — R591 Generalized enlarged lymph nodes: Secondary | ICD-10-CM

## 2021-05-07 DIAGNOSIS — Z8572 Personal history of non-Hodgkin lymphomas: Secondary | ICD-10-CM | POA: Insufficient documentation

## 2021-05-07 DIAGNOSIS — C8580 Other specified types of non-Hodgkin lymphoma, unspecified site: Secondary | ICD-10-CM

## 2021-05-07 NOTE — Procedures (Signed)
Interventional Radiology Procedure Note  Date of Procedure: 05/07/2021  Procedure: [place] core needle biopsy of right cervical lymph node   Findings:  1. Successful US guided core needle biopsy of enlarged right cervical lymph node x7 passes    Complications: No immediate complications noted.   Estimated Blood Loss: minimal  Follow-up and Recommendations: 1. Routine    Albin Felling, MD  Vascular & Interventional Radiology  05/07/2021 2:16 PM

## 2021-05-09 ENCOUNTER — Encounter: Payer: Self-pay | Admitting: Oncology

## 2021-05-09 LAB — SURGICAL PATHOLOGY

## 2021-05-09 NOTE — Telephone Encounter (Signed)
I called and spoke to the [facility] for the patient.  Let him know that the biopsy only gave him a little bitty sample and they were only able to do a flow cytometry which does not give him very much information.  Dr. Janese Banks was requesting to see if patient could have the whole entire lymph node removed so that we could be having more specimen to do the testing that is needed.  In order to do that that would mean that she would have to go into surgery.  The son wanted to know why we could not get a better sample.  I explained that they do use a CT guided machine to get it but the lymph nodes are small and when you stick the needle when sometimes we do not get a great sample and sometimes we do.  In this case we got a sample but the part that we need to test was just a very small amount and not ample amount of sample in order to do the testing we needed.  Marcello Moores would like to know who would be doing it and whether it is general anesthesia or local anesthesia and would it be an inpatient or outpatient.  I did tell him that should be an outpatient procedure.  She uses [facility] for a lot of her care and Marcello Moores is fine with [place] talking to surgeon at [facility] and coming up with a plan and letting him know.  We will contact the surgeon tomorrow and try to let sonknow an answer tomorrow if we have gotten opinion back from surgeon

## 2021-05-10 ENCOUNTER — Encounter: Payer: Self-pay | Admitting: Oncology

## 2021-05-10 ENCOUNTER — Other Ambulatory Visit: Payer: Self-pay | Admitting: *Deleted

## 2021-05-10 DIAGNOSIS — C8580 Other specified types of non-Hodgkin lymphoma, unspecified site: Secondary | ICD-10-CM

## 2021-05-10 DIAGNOSIS — R599 Enlarged lymph nodes, unspecified: Secondary | ICD-10-CM

## 2021-05-10 NOTE — Progress Notes (Signed)
ref

## 2021-05-13 ENCOUNTER — Telehealth: Payer: Self-pay | Admitting: Oncology

## 2021-05-13 NOTE — Telephone Encounter (Signed)
Patient's son called and would like to speak to Dr. Janese Banks. I explained to Marcello Moores that Dr. Janese Banks is out until at least Thursday. He is open to speaking with a nurse or NP. He has questions about the following: Patient's biopsy on 8/23, clarification on holding eliquis and if any tumor marker labs have been done recently.   He can be reached at [phone]

## 2021-05-13 NOTE — Telephone Encounter (Signed)
I called and clarified everything

## 2021-05-15 ENCOUNTER — Other Ambulatory Visit
Admission: RE | Admit: 2021-05-15 | Discharge: 2021-05-15 | Disposition: A | Discharge status: Home / Self Care | Payer: Medicare HMO | Source: Ambulatory Visit | Attending: General Surgery | Admitting: General Surgery

## 2021-05-15 ENCOUNTER — Other Ambulatory Visit: Payer: Self-pay | Admitting: General Surgery

## 2021-05-15 HISTORY — DX: Dyspnea, unspecified: R06.00

## 2021-05-15 HISTORY — DX: Atherosclerotic heart disease of native coronary artery without angina pectoris: I25.10

## 2021-05-15 NOTE — Patient Instructions (Addendum)
Your procedure is scheduled on: 05/17/21  Report to the Registration Desk on the 1st floor of the [facility]. To find out your arrival time, please call [phone] between 1PM - 3PM on: 05/16/21   REMEMBER: Instructions that are not followed completely may result in serious medical risk, up to and including death; or upon the discretion of your surgeon and anesthesiologist your surgery may need to be rescheduled.  Do not eat food after midnight the night before surgery.  No gum chewing, lozengers or hard candies.  You may however, drink CLEAR liquids up to 2 hours before you are scheduled to arrive for your surgery. Do not drink anything within 2 hours of your scheduled arrival time.  Clear liquids include: - water  - apple juice without pulp - gatorade (not RED, PURPLE, OR BLUE) - black coffee or tea (Do NOT add milk or creamers to the coffee or tea) Do NOT drink anything that is not on this list.  TAKE THESE MEDICATIONS THE MORNING OF SURGERY WITH A SIP OF WATER: - levothyroxine (SYNTHROID) 50 MCG tablet  Follow recommendations from Cardiologist, Pulmonologist or PCP regarding stopping Aspirin, Coumadin, Plavix, Eliquis, Pradaxa, or Pletal. Stop Eliquis beginning 05/14/21, restart per MD order.  One week prior to surgery: Stop Anti-inflammatories (NSAIDS) such as Advil, Aleve, Ibuprofen, Motrin, Naproxen, Naprosyn and Aspirin based products such as Excedrin, Goodys Powder, BC Powder.  Stop ANY OVER THE COUNTER supplements until after surgery.  You may take Tylenol if needed for pain up until the day of surgery.  No Alcohol for 24 hours before or after surgery.  No Smoking including e-cigarettes for 24 hours prior to surgery.  No chewable tobacco products for at least 6 hours prior to surgery.  No nicotine patches on the day of surgery.  Do not use any "recreational" drugs for at least a week prior to your surgery.  Please be advised that the combination of cocaine and  anesthesia may have negative outcomes, up to and including death. If you test positive for cocaine, your surgery will be cancelled.  On the morning of surgery brush your teeth with toothpaste and water, you may rinse your mouth with mouthwash if you wish. Do not swallow any toothpaste or mouthwash.  Do not wear jewelry, make-up, hairpins, clips or nail polish.  Do not wear lotions, powders, or perfumes.   Do not shave body from the neck down 48 hours prior to surgery just in case you cut yourself which could leave a site for infection.  Also, freshly shaved skin may become irritated if using the CHG soap.  Contact lenses, hearing aids and dentures may not be worn into surgery.  Do not bring valuables to the hospital. [facility] is not responsible for any missing/lost belongings or valuables.   Notify your doctor if there is any change in your medical condition (cold, fever, infection).  Wear comfortable clothing (specific to your surgery type) to the hospital.  After surgery, you can help prevent lung complications by doing breathing exercises.  Take deep breaths and cough every 1-2 hours. Your doctor may order a device called an Incentive Spirometer to help you take deep breaths. When coughing or sneezing, hold a pillow firmly against your incision with both hands. This is called "splinting." Doing this helps protect your incision. It also decreases belly discomfort.  If you are being admitted to the hospital overnight, leave your suitcase in the car. After surgery it may be brought to your room.  If you are being discharged the day of surgery, you will not be allowed to drive home. You will need a responsible adult (18 years or older) to drive you home and stay with you that night.   If you are taking public transportation, you will need to have a responsible adult (18 years or older) with you. Please confirm with your physician that it is acceptable to use public transportation.    Please call the [facility] at [phone] if you have any questions about these instructions.  Surgery Visitation Policy:  Patients undergoing a surgery or procedure may have one family member or support person with them as long as that person is not COVID-19 positive or experiencing its symptoms.  That person may remain in the waiting area during the procedure.  Inpatient Visitation:    Visiting hours are 7 a.m. to 8 p.m. Inpatients will be allowed two visitors daily. The visitors may change each day during the patient's stay. No visitors under the age of 61. Any visitor under the age of 23 must be accompanied by an adult. The visitor must pass COVID-19 screenings, use hand sanitizer when entering and exiting the patient's room and wear a mask at all times, including in the patient's room. Patients must also wear a mask when staff or their visitor are in the room. Masking is required regardless of vaccination status.

## 2021-05-15 NOTE — Pre-Procedure Instructions (Signed)
Pre admission phone done with Daughter in law- POA for patient, patients son Penny White will be with patient the day of surgery and will need to be with patient to assist with questions as patient has Dx of Dementia.

## 2021-05-15 NOTE — Progress Notes (Signed)
Subjective:     Patient ID: Penny White is a 86 y.o. female.   HPI   The following portions of the patient's history were reviewed and updated as appropriate.   This a new patient is here today for: office visit. She is here for evaluation of lymph nodes referred by Dr Janese Banks. She states she found the node in her neck about 2 months ago. She states she had a needle sample done 05-07-21 but the sample was not enough for a diagnosis. She has a bruise from that procedure.   She is here with her son, Penny White.   Review of Systems  Constitutional: Negative for chills and fever.  Respiratory: Negative for cough.          Chief Complaint  Patient presents with   New Patient      BP 112/62   Pulse 56   Temp 36.7 C (98 F)   Ht 157.5 cm (5\' 2" )   Wt 75.8 kg (167 lb)   SpO2 96%   BMI 30.54 kg/m        Past Medical History:  Diagnosis Date   Acute renal failure (ARF) (CMS-HCC)     Anemia in chronic kidney disease 07/16/2020   Anxiety     Arrhythmia     Arthritis     Atrial fibrillation (CMS-HCC)     CHF (congestive heart failure) (CMS-HCC)     Chronic kidney disease (CKD), stage III (moderate) (CMS-HCC)     Dementia (CMS-HCC)     Depression     Gout     History of cancer      Lymphoma   History of cardiac catheterization 06/02/2012    Normal at [facility] per patient 2007 (pre-op knee)   History of cataract     History of stroke 2018   Hyperlipidemia     Hypertension     Hypothyroidism     HZV (herpes zoster virus) post herpetic neuralgia      left forehead. 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 Phillip Heal   TBI (traumatic brain injury) (CMS-HCC) 06/2019           Past  Surgical History:  Procedure Laterality Date   BREAST EXCISIONAL BIOPSY Right      resulted in lymphoma diagnosis   cardiac catheterization       CATARACT EXTRACTION Right 11/28/2020   CATARACT EXTRACTION Left 12/04/2020   CHOLECYSTECTOMY   1978   JOINT REPLACEMENT   04/20/2006    Left total knee arthroplasty   JOINT REPLACEMENT   06/02/2003    Right total knee arthroplasty using computer-assisted navigation   KNEE ARTHROSCOPY       POLYPECTOMY       TUBAL LIGATION   05/1975                OB History     Gravida  4   Para      Term      Preterm      AB      Living  5      SAB      IAB  Ectopic      Molar      Multiple      Live Births           Obstetric Comments  Age at first period 70 Age of first pregnancy 80 A set of Twins           Social History           Socioeconomic History   Marital status: Widowed      Spouse name: Penny White   Number of children: 5   Years of education: 12   Highest education level: High school graduate  Occupational History   Occupation: Retired      Comment: [employer]  Tobacco Use   Smoking status: Never Smoker   Smokeless tobacco: Never Used  Scientific laboratory technician Use: Never used  Substance and Sexual Activity   Alcohol use: Not Currently   Drug use: Not Currently   Sexual activity: Not Currently  Social History Narrative    From [place].         Living situation:    - Lives in ALF in [place] ~2017    - 5 children; 4 of 5 are in the [place] area             Allergies  Allergen Reactions   Novocain [Procaine] Unknown      Patient doesn't remember reaction.      Current Medications        Current Outpatient Medications  Medication Sig Dispense Refill   acetaminophen (TYLENOL) 500 MG tablet Take 1 tablet (500 mg total) by mouth every 6 (six) hours as needed for Pain 30 tablet 0   apixaban (ELIQUIS) 5 mg tablet Take 1 tablet (5 mg total) by mouth every 12 (twelve)  hours 180 tablet 1   calcium carbonate-vitamin D3 (OS-CAL 500+D) 500 mg-5 mcg (200 unit) tablet Take 1 tablet by mouth 2 (two) times daily with meals 30 tablet 11   carboxymethylcellulose (REFRESH TEARS) 0.5 % ophthalmic solution Apply to eye 3 (three) times daily as needed       levothyroxine (SYNTHROID) 50 MCG tablet Take 1 tablet (50 mcg total) by mouth once daily 90 tablet 1   lidocaine (LIDODERM) 5 % patch Place 1 patch onto the skin as needed (for pain) Apply patch to the most painful area for up to 12 hours in a 24 hour period.       losartan (COZAAR) 25 MG tablet Take 1 tablet (25 mg total) by mouth once daily 90 tablet 1   predniSONE (DELTASONE) 5 MG tablet Take 1 tablet (5 mg total) by mouth once daily 90 tablet 1   rosuvastatin (CRESTOR) 40 MG tablet Take 1 tablet (40 mg total) by mouth once daily 90 tablet 1    No current facility-administered medications for this visit.             Family History  Problem Relation Age of Onset   Breast cancer Mother     Stroke Mother     High blood pressure (Hypertension) Mother     Diabetes Father     Diabetes type II Father     Depression Father     Colon cancer Sister     Kidney cancer Sister     Heart disease Brother     High blood pressure (Hypertension) Other          Siblings   Blindness Neg Hx  Cataracts Neg Hx     Glaucoma Neg Hx     Macular degeneration Neg Hx     Retinal degeneration Neg Hx               Objective:   Physical Exam Exam conducted with a chaperone present.  Constitutional:      Appearance: Normal appearance.  HENT:     Head:      Comments: 1.5 cm nodule just above the hairline to the right of the midline,  Extensive bruising without clear hematoma formation in the right neck postbiopsy. Cardiovascular:     Rate and Rhythm: Normal rate and regular rhythm.     Pulses: Normal pulses.     Heart sounds: Normal heart sounds.     Comments: Occasional premature beats. Pulmonary:     Effort:  Pulmonary effort is normal.     Breath sounds: Normal breath sounds.  Musculoskeletal:     Cervical back: Neck supple.  Lymphadenopathy:     Upper Body:     Left upper body: Axillary adenopathy present.  Skin:    General: Skin is warm and dry.  Neurological:     Mental Status: She is alert and oriented to person, place, and time.  Psychiatric:        Mood and Affect: Mood normal.        Behavior: Behavior normal.          Labs and Radiology:      PET/CT results of April 23, 2021 were independently reviewed.     NECK: Bilateral parotid, occipital, periauricular, and level Ia, Ib, II, III, and level V hypermetabolic adenopathy is observed. Index right occipital node 1.4 cm in short axis on image 27 series 3, maximum SUV 5.6 (Deau[place] 4). Index right level IIa lymph node 1.4 cm in short axis on image 40 series 3, maximum SUV 5.5 (Deauville 4). Along the left thoracoabdominal wall in just deep to the lower part of the latissimus dorsi muscle, a soft tissue density lesion measuring 2.7 by 1.1 cm on image 120 series 3 presumably represents an enlarged lymph node and has a maximum SUV of 3.4 (Deauville 3).     Biopsy result of May 07, 2021 was completed with an 18-gauge core biopsy device.   Pathology:   DIAGNOSIS:  A. LYMPH NODE, RIGHT CERVICAL; ULTRASOUND-GUIDED CORE NEEDLE BIOPSY:  - CD5-, CD10- CLONAL B-CELL POPULATION DETECTED BY FLOW CYTOMETRY.  - INSUFFICIENT TISSUE FOR HISTOLOGIC ANALYSIS AND ANCILLARY STUDIES.   Comment:  Received for review are few minute small cores of tissue.  Touch  preparations were performed for specimen triage.  This touch preparation  displays a relatively monomorphic population comprised of small to  intermediate sized lymphocytes with relatively smooth chromatin and a  moderate amount of lightly basophilic cytoplasm.  No significant  increase in larger lymphocytes is readily apparent.    April 16, 2021 CBC:   WBC 4.0 - 10.5 K/uL  3.0 Low    RBC 3.87 - 5.11 MIL/uL 3.42 Low    Hemoglobin 12.0 - 15.0 g/dL 9.9 Low    HCT 36.0 - 46.0 % 33.2 Low    MCV 80.0 - 100.0 fL 97.1   MCH 26.0 - 34.0 pg 28.9   MCHC 30.0 - 36.0 g/dL 29.8 Low    RDW 11.5 - 15.5 % 15.8 High    Platelets 150 - 400 K/uL 158   nRBC 0.0 - 0.2 % 0.0   : April 06 8840 basic metabolic  panel   Sodium 135 - 145 mmol/L 141   Potassium 3.5 - 5.1 mmol/L 4.2   Chloride 98 - 111 mmol/L 110   CO2 22 - 32 mmol/L 25   Glucose, Bld 70 - 99 mg/dL 139 High    Comment: Glucose reference range applies only to samples taken after fasting for at least 8 hours.  BUN 8 - 23 mg/dL 31 High    Creatinine, Ser 0.44 - 1.00 mg/dL 2.05 High    Calcium 8.9 - 10.3 mg/dL 8.9   GFR, Estimated >60 mL/min 23 Low         Assessment:     Recurrent lymphoma, need for adequate tissue volume for treatment planning.   Oral anticoagulant therapy for previous CVA    Plan:     Case reviewed with Dr. Ola Spurr regarding discontinuation of Eliquis for 48 hours prior to procedure.   Will plan for biopsy of the right occipital node with frozen section.  If necessary we will moved to the left axilla for excision of that area.  Would ideally avoid the axilla due to the increased potential for bleeding with the reinitiation of Eliquis.   Over 1 hour was spent during previsit chart review, consultation with PCP and in person visit today, the majority of which regard to treatment planning.      This note is partially prepared by Karie Fetch, RN, acting as a scribe in the presence of Dr. Hervey Ard, MD.  The documentation recorded by the scribe accurately reflects the service I personally performed and the decisions made by me.    Robert Bellow, MD FACS

## 2021-05-16 NOTE — Telephone Encounter (Signed)
Dr. Janese Banks spoke to family a couple of times and at this phone call she suggests that pt can have barium swalllow test done. Family was going to talk to each other and see if that is what they want to do and let us know

## 2021-05-17 ENCOUNTER — Ambulatory Visit
Admission: RE | Admit: 2021-05-17 | Discharge: 2021-05-17 | Disposition: A | Discharge status: Home / Self Care | Payer: Medicare HMO | Attending: General Surgery | Admitting: General Surgery

## 2021-05-17 ENCOUNTER — Ambulatory Visit: Payer: Medicare HMO | Admitting: Certified Registered"

## 2021-05-17 ENCOUNTER — Encounter
Admission: RE | Disposition: A | Discharge status: Home / Self Care | Payer: Self-pay | Source: Home / Self Care | Attending: General Surgery

## 2021-05-17 ENCOUNTER — Other Ambulatory Visit: Payer: Self-pay

## 2021-05-17 DIAGNOSIS — Z79899 Other long term (current) drug therapy: Secondary | ICD-10-CM | POA: Insufficient documentation

## 2021-05-17 DIAGNOSIS — I130 Hypertensive heart and chronic kidney disease with heart failure and stage 1 through stage 4 chronic kidney disease, or unspecified chronic kidney disease: Secondary | ICD-10-CM | POA: Diagnosis not present

## 2021-05-17 DIAGNOSIS — E669 Obesity, unspecified: Secondary | ICD-10-CM | POA: Insufficient documentation

## 2021-05-17 DIAGNOSIS — C8311 Mantle cell lymphoma, lymph nodes of head, face, and neck: Secondary | ICD-10-CM | POA: Insufficient documentation

## 2021-05-17 DIAGNOSIS — N183 Chronic kidney disease, stage 3 unspecified: Secondary | ICD-10-CM | POA: Insufficient documentation

## 2021-05-17 DIAGNOSIS — Z7952 Long term (current) use of systemic steroids: Secondary | ICD-10-CM | POA: Insufficient documentation

## 2021-05-17 DIAGNOSIS — Z884 Allergy status to anesthetic agent status: Secondary | ICD-10-CM | POA: Diagnosis not present

## 2021-05-17 DIAGNOSIS — Z6830 Body mass index (BMI) 30.0-30.9, adult: Secondary | ICD-10-CM | POA: Insufficient documentation

## 2021-05-17 DIAGNOSIS — Z881 Allergy status to other antibiotic agents status: Secondary | ICD-10-CM | POA: Insufficient documentation

## 2021-05-17 DIAGNOSIS — Z7901 Long term (current) use of anticoagulants: Secondary | ICD-10-CM | POA: Insufficient documentation

## 2021-05-17 DIAGNOSIS — C8310 Mantle cell lymphoma, unspecified site: Secondary | ICD-10-CM | POA: Diagnosis present

## 2021-05-17 DIAGNOSIS — I509 Heart failure, unspecified: Secondary | ICD-10-CM | POA: Insufficient documentation

## 2021-05-17 HISTORY — PX: LYMPH NODE BIOPSY: SHX201

## 2021-05-17 SURGERY — LYMPH NODE BIOPSY
Anesthesia: General

## 2021-05-17 MED ORDER — ACETAMINOPHEN 10 MG/ML IV SOLN
INTRAVENOUS | Status: AC
  Filled 2021-05-17: qty 100

## 2021-05-17 MED ORDER — CHLORHEXIDINE GLUCONATE CLOTH 2 % EX PADS
6.0000 | MEDICATED_PAD | Freq: Once | CUTANEOUS | Status: AC
  Administered 2021-05-17: 6 via TOPICAL

## 2021-05-17 MED ORDER — LIDOCAINE HCL (PF) 1 % IJ SOLN
INTRAMUSCULAR | Status: AC
  Filled 2021-05-17: qty 30

## 2021-05-17 MED ORDER — BUPIVACAINE HCL 0.5 % IJ SOLN
INTRAMUSCULAR | Status: DC | PRN
  Administered 2021-05-17: 20 mL

## 2021-05-17 MED ORDER — FENTANYL CITRATE (PF) 100 MCG/2ML IJ SOLN
INTRAMUSCULAR | Status: AC
  Filled 2021-05-17: qty 2

## 2021-05-17 MED ORDER — VASOPRESSIN 20 UNIT/ML IV SOLN
INTRAVENOUS | Status: DC | PRN
  Administered 2021-05-17: 1 [IU] via INTRAVENOUS

## 2021-05-17 MED ORDER — PROPOFOL 500 MG/50ML IV EMUL
INTRAVENOUS | Status: DC | PRN
  Administered 2021-05-17: 75 ug/kg/min via INTRAVENOUS

## 2021-05-17 MED ORDER — APIXABAN 5 MG PO TABS: 5.0000 mg | ORAL_TABLET | Freq: Two times a day (BID) | ORAL | 0 refills | Status: DC

## 2021-05-17 MED ORDER — FAMOTIDINE 20 MG PO TABS
20.0000 mg | ORAL_TABLET | Freq: Once | ORAL | Status: DC
Start: 2021-05-17 — End: 2021-05-17

## 2021-05-17 MED ORDER — PHENYLEPHRINE HCL (PRESSORS) 10 MG/ML IV SOLN
INTRAVENOUS | Status: DC | PRN
  Administered 2021-05-17: 100 ug via INTRAVENOUS

## 2021-05-17 MED ORDER — BUPIVACAINE HCL (PF) 0.5 % IJ SOLN
INTRAMUSCULAR | Status: AC
  Filled 2021-05-17: qty 30

## 2021-05-17 MED ORDER — 0.9 % SODIUM CHLORIDE (POUR BTL) OPTIME
TOPICAL | Status: DC | PRN
  Administered 2021-05-17: 1000 mL

## 2021-05-17 MED ORDER — CHLORHEXIDINE GLUCONATE 0.12 % MT SOLN
15.0000 mL | Freq: Once | OROMUCOSAL | Status: AC
  Administered 2021-05-17: 15 mL via OROMUCOSAL

## 2021-05-17 MED ORDER — LACTATED RINGERS IV SOLN: INTRAVENOUS | Status: DC

## 2021-05-17 MED ORDER — PROPOFOL 10 MG/ML IV BOLUS
INTRAVENOUS | Status: DC | PRN
  Administered 2021-05-17: 10 mg via INTRAVENOUS
  Administered 2021-05-17: 20 mg via INTRAVENOUS

## 2021-05-17 MED ORDER — METHYLENE BLUE 0.5 % INJ SOLN
INTRAVENOUS | Status: AC
  Filled 2021-05-17: qty 10

## 2021-05-17 MED ORDER — EPHEDRINE SULFATE 50 MG/ML IJ SOLN
INTRAMUSCULAR | Status: DC | PRN
  Administered 2021-05-17: 5 mg via INTRAVENOUS

## 2021-05-17 MED ORDER — GLYCOPYRROLATE 0.2 MG/ML IJ SOLN
INTRAMUSCULAR | Status: DC | PRN
  Administered 2021-05-17: .2 mg via INTRAVENOUS

## 2021-05-17 MED ORDER — BUPIVACAINE-EPINEPHRINE (PF) 0.5% -1:200000 IJ SOLN
INTRAMUSCULAR | Status: AC
  Filled 2021-05-17: qty 30

## 2021-05-17 MED ORDER — ORAL CARE MOUTH RINSE: 15.0000 mL | Freq: Once | OROMUCOSAL | Status: AC

## 2021-05-17 MED ORDER — ACETAMINOPHEN 500 MG PO TABS: 500.0000 mg | ORAL_TABLET | ORAL | 0 refills | Status: AC | PRN

## 2021-05-17 MED ORDER — BUPIVACAINE-EPINEPHRINE (PF) 0.25% -1:200000 IJ SOLN
INTRAMUSCULAR | Status: AC
  Filled 2021-05-17: qty 30

## 2021-05-17 SURGICAL SUPPLY — 41 items
APL PRP STRL LF DISP 70% ISPRP (MISCELLANEOUS) ×1
APPLIER CLIP 11 MED OPEN (CLIP)
APR CLP MED 11 20 MLT OPN (CLIP)
BAG COUNTER SPONGE SURGICOUNT (BAG) ×2 IMPLANT
BAG SPNG CNTER NS LX DISP (BAG) ×1
CHLORAPREP W/TINT 26 (MISCELLANEOUS) ×2 IMPLANT
CLIP APPLIE 11 MED OPEN (CLIP) IMPLANT
CNTNR SPEC 2.5X3XGRAD LEK (MISCELLANEOUS) ×1
CONT SPEC 4OZ STER OR WHT (MISCELLANEOUS) ×1
CONT SPEC 4OZ STRL OR WHT (MISCELLANEOUS) ×1
CONTAINER SPEC 2.5X3XGRAD LEK (MISCELLANEOUS) ×1 IMPLANT
COVER PROBE FLX POLY STRL (MISCELLANEOUS) ×2 IMPLANT
DRAPE LAPAROTOMY 77X122 PED (DRAPES) ×2 IMPLANT
DRSG TEGADERM 2-3/8X2-3/4 SM (GAUZE/BANDAGES/DRESSINGS) ×2 IMPLANT
DRSG TELFA 4X3 1S NADH ST (GAUZE/BANDAGES/DRESSINGS) ×2 IMPLANT
ELECT CAUTERY BLADE TIP 2.5 (TIP) ×2
ELECT REM PT RETURN 9FT ADLT (ELECTROSURGICAL) ×2
ELECTRODE CAUTERY BLDE TIP 2.5 (TIP) ×1 IMPLANT
ELECTRODE REM PT RTRN 9FT ADLT (ELECTROSURGICAL) ×1 IMPLANT
GAUZE 4X4 16PLY ~~LOC~~+RFID DBL (SPONGE) ×2 IMPLANT
GLOVE SURG ENC MOIS LTX SZ7.5 (GLOVE) ×2 IMPLANT
GLOVE SURG UNDER LTX SZ8 (GLOVE) ×2 IMPLANT
GOWN STRL REUS W/ TWL LRG LVL3 (GOWN DISPOSABLE) ×2 IMPLANT
GOWN STRL REUS W/TWL LRG LVL3 (GOWN DISPOSABLE) ×4
LABEL OR SOLS (LABEL) ×2 IMPLANT
MANIFOLD NEPTUNE II (INSTRUMENTS) ×2 IMPLANT
NDL SAFETY ECLIPSE 18X1.5 (NEEDLE) IMPLANT
NEEDLE HYPO 18GX1.5 SHARP (NEEDLE)
NEEDLE HYPO 22GX1.5 SAFETY (NEEDLE) ×2 IMPLANT
NEEDLE HYPO 25X1 1.5 SAFETY (NEEDLE) IMPLANT
NS IRRIG 500ML POUR BTL (IV SOLUTION) ×2 IMPLANT
PACK BASIN MINOR ARMC (MISCELLANEOUS) ×2 IMPLANT
SHEARS HARMONIC 9CM CVD (BLADE) IMPLANT
STRIP CLOSURE SKIN 1/2X4 (GAUZE/BANDAGES/DRESSINGS) ×2 IMPLANT
SUT VIC AB 3-0 54X BRD REEL (SUTURE) ×1 IMPLANT
SUT VIC AB 3-0 BRD 54 (SUTURE) ×2
SUT VIC AB 3-0 SH 27 (SUTURE) ×2
SUT VIC AB 3-0 SH 27X BRD (SUTURE) ×1 IMPLANT
SUT VIC AB 4-0 PS2 18 (SUTURE) ×2 IMPLANT
SWABSTK COMLB BENZOIN TINCTURE (MISCELLANEOUS) ×2 IMPLANT
SYR 10ML LL (SYRINGE) ×2 IMPLANT

## 2021-05-17 NOTE — Transfer of Care (Signed)
Immediate Anesthesia Transfer of Care Note  Patient: [name]  Procedure(s) Performed: RIGHT OCCIPITAL LYMPH NODE BIOPSY  Patient Location: PACU  Anesthesia Type:MAC  Level of Consciousness: awake, drowsy and patient cooperative  Airway & Oxygen Therapy: Patient Spontanous Breathing and Patient connected to face mask oxygen  Post-op Assessment: Report given to RN and Post -op Vital signs reviewed and stable  Post vital signs: Reviewed and stable  Last Vitals:  Vitals Value Taken Time  BP 100/43 05/17/21 1140  Temp    Pulse 69 05/17/21 1146  Resp 19 05/17/21 1146  SpO2 100 % 05/17/21 1146  Vitals shown include unvalidated device data.  Last Pain:  Vitals:   05/17/21 0833  TempSrc: Temporal  PainSc: 0-No pain         Complications: No notable events documented.

## 2021-05-17 NOTE — H&P (Signed)
Penny White [id number] August 30, 1935     HPI:  86 y/o with past history of lymphoma.  Recurrent disease with need for tissue confirmation of cell type.    Medications Prior to Admission  Medication Sig Dispense Refill Last Dose   acetaminophen (TYLENOL) 500 MG tablet Take 500 mg by mouth every 6 (six) hours as needed for moderate pain.   Past Week   apixaban (ELIQUIS) 5 MG TABS tablet Take 5 mg by mouth 2 (two) times daily.   05/14/2021   carboxymethylcellulose (REFRESH PLUS) 0.5 % SOLN Place 1 drop into both eyes 3 (three) times daily as needed.   Past Week   levothyroxine (SYNTHROID) 50 MCG tablet Take 50 mcg by mouth daily before breakfast.   05/17/2021   lidocaine (LIDODERM) 5 % Place 1 patch onto the skin daily. Remove & Discard patch within 12 hours or as directed by MD   Past Week   losartan (COZAAR) 25 MG tablet Take 25 mg by mouth daily.   05/16/2021   Multiple Vitamins-Iron (MULTIVITAMIN/IRON PO) Take 1 tablet by mouth daily.   05/16/2021   predniSONE (DELTASONE) 5 MG tablet Take 5 mg by mouth daily with breakfast.   05/16/2021   rosuvastatin (CRESTOR) 40 MG tablet Take 40 mg by mouth daily.   05/16/2021   senna (SENOKOT) 8.6 MG TABS tablet Take 1 tablet by mouth every 12 (twelve) hours as needed for mild constipation.   Past Week   Allergies  Allergen Reactions   Ciprofloxacin Itching   Novocain [Procaine]     Doesn't remember reaction   Past Medical History:  Diagnosis Date   Arthritis    Atherosclerosis    Atrial fibrillation (HCC)    CHF (congestive heart failure) (HCC)    Coronary artery disease    Dementia (HCC)    Dyspnea    Encephalitis 2020   Gout    Hyperlipemia    Hypertension    Hypothyroid    Kidney disease, chronic, stage III (moderate, EGFR 30-59 ml/min) (HCC)    now stage 4   Shingles 2020   Small cell B-cell lymphoma, unspecified site (HCC)    Squamous cell carcinoma of skin    L arm removed by Dr Phillip Heal    Stroke [facility]) 2018   + 2021.  Peripheral  vision damage.   Subdural hemorrhage ([place]) 2020   Past Surgical History:  Procedure Laterality Date   CARDIAC CATHETERIZATION     CATARACT EXTRACTION W/PHACO Left 12/04/2020   Procedure: CATARACT EXTRACTION PHACO AND INTRAOCULAR LENS PLACEMENT (IOC) LEFT 6.95 00:45.9;  Surgeon: Birder Robson, MD;  Location: [facility];  Service: Ophthalmology;  Laterality: Left;   CATARACT EXTRACTION W/PHACO Right 12/18/2020   Procedure: CATARACT EXTRACTION PHACO AND INTRAOCULAR LENS PLACEMENT (IOC) RIGHT 7.07 00:42.9;  Surgeon: Birder Robson, MD;  Location: [facility];  Service: Ophthalmology;  Laterality: Right;   CHOLECYSTECTOMY  1978   COLONOSCOPY     REPLACEMENT TOTAL KNEE Bilateral    Right 06/02/03,  left 04/20/06   TUBAL LIGATION     Social History   Socioeconomic History   Marital status: Widowed    Spouse name: Not on file   Number of children: Not on file   Years of education: Not on file   Highest education level: Not on file  Occupational History   Not on file  Tobacco Use   Smoking status: Never   Smokeless tobacco: Never  Vaping Use   Vaping Use: Never used  Substance and Sexual Activity   Alcohol use: Not Currently   Drug use: Not Currently   Sexual activity: Not Currently  Other Topics Concern   Not on file  Social History Narrative   Homeplace in [place]   Social Determinants of Health   Financial Resource Strain: Not on file  Food Insecurity: Not on file  Transportation Needs: Not on file  Physical Activity: Not on file  Stress: Not on file  Social Connections: Not on file  Intimate Partner Violence: Not on file   Social History   Social History Narrative   Homeplace in [place]     ROS: Negative.     PE: HEENT: Negative. Lungs: Clear. Cardio: RR.  Assessment/Plan:  Proceed with planned node biopsy. Will start with the occipital node and if inadequate tissue noted, will proceed to the left axilla.   Forest Gleason  [facility] 05/17/2021

## 2021-05-17 NOTE — Op Note (Signed)
Preoperative diagnosis: Mantle cell lymphoma, recurrent.  Need for adequate tissue sample  Postoperative diagnosis: Same.  Procedure: Excision right occipital lymph node.  Operating Surgeon: Hervey Ard, MD.  Anesthesia: 20 cc 0.5% Marcaine with 1: 200,000 units of epinephrine.,  Propofol by anesthesia.  Estimated blood loss: 2 cc.  Clinical note: This 86 year old woman has previously been treated for mantle cell lymphoma.  Has had recurrent disease and tissue requested for complete phenotyping.  Operative note: The patient underwent sedation which will the left lateral decubitus position beanbag.  The shoulder was taped inferiorly and maximum flexion of the neck gently achieved.  The palpable area in the right occipital area was cleared of hair with clippers.  ChloraPrep was applied.  Local anesthesia was infiltrated.  A transverse incision was made and the lymph node identified.  This was dissected making use of cautery.  This extended down to but not into the paraspinal muscles.  Good hemostasis was noted.  The wound was closed in layers with interrupted 3-0 Vicryl figure-of-eight sutures.  Skin closed with a running 4-0 Vicryl subcuticular suture.  Benzoin, Steri-Strips, Telfa and Tegaderm dressing applied.  The patient tolerated the procedure well and was taken the PACU in stable condition.  Pathology reported adequate sample for analysis prior to patient leaving the operating.

## 2021-05-17 NOTE — Discharge Instructions (Signed)

## 2021-05-17 NOTE — Anesthesia Postprocedure Evaluation (Signed)
Anesthesia Post Note  Patient: Penny White  Procedure(s) Performed: RIGHT OCCIPITAL LYMPH NODE BIOPSY  Patient location during evaluation: PACU Anesthesia Type: General Level of consciousness: awake and alert and oriented Pain management: pain level controlled Vital Signs Assessment: post-procedure vital signs reviewed and stable Respiratory status: spontaneous breathing, nonlabored ventilation and respiratory function stable Cardiovascular status: blood pressure returned to baseline and stable Postop Assessment: no signs of nausea or vomiting Anesthetic complications: no   No notable events documented.   Last Vitals:  Vitals:   05/17/21 1245 05/17/21 1309  BP: (!) 95/50 (!) 98/58  Pulse:  65  Resp: 20 18  Temp:  (!) 36.3 C  SpO2:  96%    Last Pain:  Vitals:   05/17/21 1309  TempSrc: Temporal  PainSc: 0-No pain                 Creedon Danielski

## 2021-05-17 NOTE — Anesthesia Preprocedure Evaluation (Signed)
Anesthesia Evaluation  Patient identified by MRN, date of birth, ID band Patient awake    Reviewed: Allergy & Precautions, NPO status , Patient's Chart, lab work & pertinent test results  History of Anesthesia Complications Negative for: history of anesthetic complications  Airway Mallampati: II  TM Distance: >3 FB Neck ROM: Full    Dental  (+) Poor Dentition   Pulmonary neg pulmonary ROS, neg sleep apnea, neg COPD,    breath sounds clear to auscultation- rhonchi (-) wheezing      Cardiovascular hypertension, Pt. on medications + CAD and +CHF  (-) Past MI, (-) Cardiac Stents and (-) CABG + dysrhythmias Atrial Fibrillation  Rhythm:Regular Rate:Normal - Systolic murmurs and - Diastolic murmurs    Neuro/Psych neg Seizures PSYCHIATRIC DISORDERS Dementia CVA, No Residual Symptoms    GI/Hepatic negative GI ROS, Neg liver ROS,   Endo/Other  neg diabetesHypothyroidism   Renal/GU CRFRenal disease     Musculoskeletal  (+) Arthritis ,   Abdominal (+) + obese,   Peds  Hematology negative hematology ROS (+)   Anesthesia Other Findings Past Medical History: No date: Arthritis No date: Atherosclerosis No date: Atrial fibrillation (HCC) No date: CHF (congestive heart failure) (HCC) No date: Coronary artery disease No date: Dementia ([place]) No date: Dyspnea 2020: Encephalitis No date: Gout No date: Hyperlipemia No date: Hypertension No date: Hypothyroid No date: Kidney disease, chronic, stage III (moderate, EGFR 30-59 ml/ min) ([place])     Comment:  now stage 4 2020: Shingles No date: Small cell B-cell lymphoma, unspecified site ([place]) No date: Squamous cell carcinoma of skin     Comment:  L arm removed by Dr Phillip Heal  2018: Stroke [facility])     Comment:  + 2021.  Peripheral vision damage. 2020: Subdural hemorrhage (HCC)   Reproductive/Obstetrics                             Anesthesia  Physical Anesthesia Plan  ASA: 3  Anesthesia Plan: General   Post-op Pain Management:    Induction: Intravenous  PONV Risk Score and Plan: 2 and Propofol infusion  Airway Management Planned: Natural Airway  Additional Equipment:   Intra-op Plan:   Post-operative Plan:   Informed Consent: I have reviewed the patients History and Physical, chart, labs and discussed the procedure including the risks, benefits and alternatives for the proposed anesthesia with the patient or authorized representative who has indicated his/her understanding and acceptance.     Dental advisory given  Plan Discussed with: CRNA and Anesthesiologist  Anesthesia Plan Comments:         Anesthesia Quick Evaluation

## 2021-05-18 ENCOUNTER — Encounter: Payer: Self-pay | Admitting: General Surgery

## 2021-05-22 ENCOUNTER — Other Ambulatory Visit: Payer: Self-pay

## 2021-05-22 ENCOUNTER — Encounter: Payer: Self-pay | Admitting: Dermatology

## 2021-05-22 ENCOUNTER — Ambulatory Visit (INDEPENDENT_AMBULATORY_CARE_PROVIDER_SITE_OTHER): Payer: Medicare HMO | Admitting: Dermatology

## 2021-05-22 DIAGNOSIS — L578 Other skin changes due to chronic exposure to nonionizing radiation: Secondary | ICD-10-CM | POA: Diagnosis not present

## 2021-05-22 DIAGNOSIS — L820 Inflamed seborrheic keratosis: Secondary | ICD-10-CM

## 2021-05-22 DIAGNOSIS — D692 Other nonthrombocytopenic purpura: Secondary | ICD-10-CM

## 2021-05-22 DIAGNOSIS — L821 Other seborrheic keratosis: Secondary | ICD-10-CM

## 2021-05-22 DIAGNOSIS — L570 Actinic keratosis: Secondary | ICD-10-CM | POA: Diagnosis not present

## 2021-05-22 DIAGNOSIS — Z85828 Personal history of other malignant neoplasm of skin: Secondary | ICD-10-CM | POA: Diagnosis not present

## 2021-05-22 DIAGNOSIS — Z8572 Personal history of non-Hodgkin lymphomas: Secondary | ICD-10-CM

## 2021-05-22 LAB — SURGICAL PATHOLOGY

## 2021-05-22 NOTE — Patient Instructions (Signed)

## 2021-05-22 NOTE — Progress Notes (Signed)
Follow-Up Visit   Subjective  Penny White is a 86 y.o. female who presents for the following: check spot (L upper arm, ~48m/R arm, just noticed, crusty/Face, ).  Patient accompanied by daughter-in-law, who contributes to history  The following portions of the chart were reviewed this encounter and updated as appropriate:   Tobacco  Allergies  Meds  Problems  Med Hx  Surg Hx  Fam Hx     Review of Systems:  No other skin or systemic complaints except as noted in HPI or Assessment and Plan.  Objective  Well appearing patient in no apparent distress; mood and affect are within normal limits.  A focused examination was performed including left arm, right arm, face. Relevant physical exam findings are noted in the Assessment and Plan.  L shoulder x 1, L cheek x 1 Total = 2 (2) Erythematous keratotic or waxy stuck-on papule or plaque.   forehead x 15 (15) Hyperkeratotic scaly macules   Assessment & Plan   History of Squamous Cell Carcinoma of the Skin - No evidence of recurrence today - No lymphadenopathy - Recommend regular full body skin exams - Recommend daily broad spectrum sunscreen SPF 30+ to sun-exposed areas, reapply every 2 hours as needed.  - Call if any new or changing lesions are noted between office visits - L lat upper arm   Inflamed seborrheic keratosis L shoulder x 1, L cheek x 1 Total = 2  Destruction of lesion - L shoulder x 1, L cheek x 1 Total = 2 Complexity: simple   Destruction method: cryotherapy   Informed consent: discussed and consent obtained   Timeout:  patient name, date of birth, surgical site, and procedure verified Lesion destroyed using liquid nitrogen: Yes   Region frozen until ice ball extended beyond lesion: Yes   Outcome: patient tolerated procedure well with no complications   Post-procedure details: wound care instructions given    Hypertrophic actinic keratosis (15) forehead x 15  Destruction of lesion - forehead x  15 Complexity: simple   Destruction method: cryotherapy   Informed consent: discussed and consent obtained   Timeout:  patient name, date of birth, surgical site, and procedure verified Lesion destroyed using liquid nitrogen: Yes   Region frozen until ice ball extended beyond lesion: Yes   Outcome: patient tolerated procedure well with no complications   Post-procedure details: wound care instructions given    Hx of Lymphoma - Pt to cont care with Dr. Janese Banks at [facility]; persistent and recurrent.  Treatable, but not curable. - Violaceous macules and patches - Benign - Related to trauma, age, sun damage and/or use of blood thinners, chronic use of topical and/or oral steroids - Observe - Can use OTC arnica containing moisturizer such as Dermend Bruise Formula if desired - Call for worsening or other concerns  Seborrheic Keratoses - Stuck-on, waxy, tan-brown papules and/or plaques  - Benign-appearing - Discussed benign etiology and prognosis. - Observe - Call for any changes  Actinic Damage - chronic, secondary to cumulative UV radiation exposure/sun exposure over time - diffuse scaly erythematous macules with underlying dyspigmentation - Recommend daily broad spectrum sunscreen SPF 30+ to sun-exposed areas, reapply every 2 hours as needed.  - Recommend staying in the shade or wearing long sleeves, sun glasses (UVA+UVB protection) and wide brim hats (4-inch brim around the entire circumference of the hat). - Call for new or changing lesions.  Return in about 2 months (around 07/22/2021) for  2-21m AK/ISK f/u.  I, Othelia Pulling, RMA, am acting as scribe for Sarina Ser, MD . Documentation: I have reviewed the above documentation for accuracy and completeness, and I agree with the above.  Sarina Ser, MD

## 2021-05-23 ENCOUNTER — Telehealth: Payer: Self-pay | Admitting: Oncology

## 2021-05-23 ENCOUNTER — Encounter: Payer: Self-pay | Admitting: Dermatology

## 2021-05-23 NOTE — Telephone Encounter (Signed)
Just sent him a [employer]. I have already forwarded a previous MyChart message from him where he requested a call back to discuss biopsy. Thanks!

## 2021-05-28 ENCOUNTER — Encounter: Payer: Self-pay | Admitting: Oncology

## 2021-05-30 ENCOUNTER — Ambulatory Visit: Payer: Medicare HMO | Admitting: Oncology

## 2021-05-30 ENCOUNTER — Encounter: Payer: Self-pay | Admitting: Oncology

## 2021-06-01 NOTE — Telephone Encounter (Signed)
I have called them once last weekend. They see me next friday

## 2021-06-06 ENCOUNTER — Encounter: Payer: Self-pay | Admitting: Oncology

## 2021-06-07 ENCOUNTER — Inpatient Hospital Stay: Payer: Medicare HMO

## 2021-06-07 ENCOUNTER — Encounter: Payer: Self-pay | Admitting: Oncology

## 2021-06-07 ENCOUNTER — Other Ambulatory Visit: Payer: Self-pay | Admitting: *Deleted

## 2021-06-07 ENCOUNTER — Other Ambulatory Visit: Payer: Self-pay

## 2021-06-07 ENCOUNTER — Other Ambulatory Visit (HOSPITAL_COMMUNITY): Payer: Self-pay

## 2021-06-07 ENCOUNTER — Telehealth: Payer: Self-pay | Admitting: Pharmacy Technician

## 2021-06-07 ENCOUNTER — Inpatient Hospital Stay: Payer: Medicare HMO | Attending: Oncology | Admitting: Oncology

## 2021-06-07 VITALS — BP 103/68 | HR 56 | Temp 98.1°F | Resp 16 | Ht 62.0 in | Wt 167.0 lb

## 2021-06-07 DIAGNOSIS — Z7901 Long term (current) use of anticoagulants: Secondary | ICD-10-CM | POA: Insufficient documentation

## 2021-06-07 DIAGNOSIS — R001 Bradycardia, unspecified: Secondary | ICD-10-CM | POA: Insufficient documentation

## 2021-06-07 DIAGNOSIS — I509 Heart failure, unspecified: Secondary | ICD-10-CM | POA: Insufficient documentation

## 2021-06-07 DIAGNOSIS — I482 Chronic atrial fibrillation, unspecified: Secondary | ICD-10-CM | POA: Diagnosis not present

## 2021-06-07 DIAGNOSIS — Z85828 Personal history of other malignant neoplasm of skin: Secondary | ICD-10-CM | POA: Diagnosis not present

## 2021-06-07 DIAGNOSIS — C8580 Other specified types of non-Hodgkin lymphoma, unspecified site: Secondary | ICD-10-CM | POA: Diagnosis not present

## 2021-06-07 DIAGNOSIS — Z8572 Personal history of non-Hodgkin lymphomas: Secondary | ICD-10-CM | POA: Insufficient documentation

## 2021-06-07 DIAGNOSIS — Z881 Allergy status to other antibiotic agents status: Secondary | ICD-10-CM | POA: Insufficient documentation

## 2021-06-07 DIAGNOSIS — I130 Hypertensive heart and chronic kidney disease with heart failure and stage 1 through stage 4 chronic kidney disease, or unspecified chronic kidney disease: Secondary | ICD-10-CM | POA: Insufficient documentation

## 2021-06-07 DIAGNOSIS — Z79899 Other long term (current) drug therapy: Secondary | ICD-10-CM | POA: Insufficient documentation

## 2021-06-07 DIAGNOSIS — Z8673 Personal history of transient ischemic attack (TIA), and cerebral infarction without residual deficits: Secondary | ICD-10-CM | POA: Insufficient documentation

## 2021-06-07 DIAGNOSIS — Z7189 Other specified counseling: Secondary | ICD-10-CM | POA: Diagnosis not present

## 2021-06-07 DIAGNOSIS — D649 Anemia, unspecified: Secondary | ICD-10-CM | POA: Insufficient documentation

## 2021-06-07 DIAGNOSIS — R350 Frequency of micturition: Secondary | ICD-10-CM | POA: Diagnosis not present

## 2021-06-07 DIAGNOSIS — R918 Other nonspecific abnormal finding of lung field: Secondary | ICD-10-CM | POA: Diagnosis not present

## 2021-06-07 DIAGNOSIS — Z9049 Acquired absence of other specified parts of digestive tract: Secondary | ICD-10-CM | POA: Insufficient documentation

## 2021-06-07 DIAGNOSIS — R5383 Other fatigue: Secondary | ICD-10-CM | POA: Insufficient documentation

## 2021-06-07 DIAGNOSIS — R1310 Dysphagia, unspecified: Secondary | ICD-10-CM | POA: Insufficient documentation

## 2021-06-07 DIAGNOSIS — R590 Localized enlarged lymph nodes: Secondary | ICD-10-CM | POA: Insufficient documentation

## 2021-06-07 LAB — URINALYSIS, COMPLETE (UACMP) WITH MICROSCOPIC
Bilirubin Urine: NEGATIVE
Glucose, UA: NEGATIVE mg/dL
Ketones, ur: NEGATIVE mg/dL
Nitrite: NEGATIVE
Protein, ur: 100 mg/dL — AB
Specific Gravity, Urine: 1.013 (ref 1.005–1.030)
Squamous Epithelial / HPF: NONE SEEN (ref 0–5)
WBC, UA: >50 WBC/hpf — ABNORMAL HIGH (ref 0–5)
pH: 6 (ref 5.0–8.0)

## 2021-06-07 LAB — CBC WITH DIFFERENTIAL/PLATELET
Abs Immature Granulocytes: 0.08 10*3/uL — ABNORMAL HIGH (ref 0.00–0.07)
Basophils Absolute: 0 10*3/uL (ref 0.0–0.1)
Basophils Relative: 2 %
Eosinophils Absolute: 0.1 10*3/uL (ref 0.0–0.5)
Eosinophils Relative: 4 %
HCT: 31.1 % — ABNORMAL LOW (ref 36.0–46.0)
Hemoglobin: 9.5 g/dL — ABNORMAL LOW (ref 12.0–15.0)
Immature Granulocytes: 3 %
Lymphocytes Relative: 11 %
Lymphs Abs: 0.3 10*3/uL — ABNORMAL LOW (ref 0.7–4.0)
MCH: 28.4 pg (ref 26.0–34.0)
MCHC: 30.5 g/dL (ref 30.0–36.0)
MCV: 93.1 fL (ref 80.0–100.0)
Monocytes Absolute: 0.3 10*3/uL (ref 0.1–1.0)
Monocytes Relative: 12 %
Neutro Abs: 1.8 10*3/uL (ref 1.7–7.7)
Neutrophils Relative %: 68 %
Platelets: 179 10*3/uL (ref 150–400)
RBC: 3.34 MIL/uL — ABNORMAL LOW (ref 3.87–5.11)
RDW: 16.3 % — ABNORMAL HIGH (ref 11.5–15.5)
WBC: 2.6 10*3/uL — ABNORMAL LOW (ref 4.0–10.5)
nRBC: 0 % (ref 0.0–0.2)

## 2021-06-07 LAB — COMPREHENSIVE METABOLIC PANEL
ALT: 70 U/L — ABNORMAL HIGH (ref 0–44)
AST: 65 U/L — ABNORMAL HIGH (ref 15–41)
Albumin: 3.2 g/dL — ABNORMAL LOW (ref 3.5–5.0)
Alkaline Phosphatase: 62 U/L (ref 38–126)
Anion gap: 11 (ref 5–15)
BUN: 25 mg/dL — ABNORMAL HIGH (ref 8–23)
CO2: 26 mmol/L (ref 22–32)
Calcium: 8.9 mg/dL (ref 8.9–10.3)
Chloride: 103 mmol/L (ref 98–111)
Creatinine, Ser: 2 mg/dL — ABNORMAL HIGH (ref 0.44–1.00)
GFR, Estimated: 24 mL/min — ABNORMAL LOW (ref >60)
Glucose, Bld: 98 mg/dL (ref 70–99)
Potassium: 4.2 mmol/L (ref 3.5–5.1)
Sodium: 140 mmol/L (ref 135–145)
Total Bilirubin: 1 mg/dL (ref 0.3–1.2)
Total Protein: 6.2 g/dL — ABNORMAL LOW (ref 6.5–8.1)

## 2021-06-07 LAB — IRON AND TIBC
Iron: 43 ug/dL (ref 28–170)
Saturation Ratios: 15 % (ref 10.4–31.8)
TIBC: 283 ug/dL (ref 250–450)
UIBC: 240 ug/dL

## 2021-06-07 LAB — FOLATE: Folate: 25 ng/mL (ref >5.9)

## 2021-06-07 LAB — TSH: TSH: 1.129 u[IU]/mL (ref 0.350–4.500)

## 2021-06-07 LAB — FERRITIN: Ferritin: 68 ng/mL (ref 11–307)

## 2021-06-07 LAB — LACTATE DEHYDROGENASE: LDH: 437 U/L — ABNORMAL HIGH (ref 98–192)

## 2021-06-07 LAB — VITAMIN B12: Vitamin B-12: 536 pg/mL (ref 180–914)

## 2021-06-07 LAB — URIC ACID: Uric Acid, Serum: 4.1 mg/dL (ref 2.5–7.1)

## 2021-06-07 NOTE — Telephone Encounter (Signed)
Oral Oncology Patient Advocate Encounter  Prior Authorization for Brukinsa has been approved.    PA# [id number] Effective dates: 05/08/21 through 06/06/24  Patients co-pay is $77.40.  Oral Oncology Clinic will continue to follow.   [place] Patient [place] Phone [phone] Fax [phone] 06/07/2021 3:23 PM

## 2021-06-07 NOTE — Telephone Encounter (Signed)
Oral Oncology Patient Advocate Encounter   Received notification from [employer] (Med D plan) that prior authorization for Brukinsa is required.   PA submitted on CoverMyMeds Key [id number] Status is pending   Oral Oncology Clinic will continue to follow.  [place] Patient [place] Phone [phone] Fax [phone] 06/07/2021 3:14 PM

## 2021-06-08 LAB — URINE CULTURE

## 2021-06-08 MED ORDER — AMOXICILLIN-POT CLAVULANATE 250-62.5 MG/5ML PO SUSR: 250.0000 mg | Freq: Two times a day (BID) | ORAL | 0 refills | Status: AC

## 2021-06-08 NOTE — Progress Notes (Signed)
Hematology/Oncology Consult note [facility]  [phone] [phone]  Patient Care Team: Leonel Ramsay, MD as PCP - General (Infectious Diseases)   Name of the patient: Penny White  [id number]  10/08/1934   Date of visit: 06/08/21  Diagnosis- 1.  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  Splenic lesions likely benign  Chief complaint/ Reason for visit-discuss pathology results and further management  Heme/Onc history:  Patient is a 86 year old female with a past medical history significant for chronic atrial fibrillation, heart failure, B12 deficiency, CVA among other medical problems.  She also has a diagnosis of marginal zone lymphoma for which she was on Bendamustine Rituxan and then maintenance Rituxan up until August 2019.  She then had progression on rituximab and received cycle 6 of obinutuzumab in July 2020 along with Revlimid.  Revlimid was discontinued in April 2020 due to bradycardia.  She has also received IVIG in the past but none since October 2021.  Her last CT chest abdomen pelvis with contrast in September 2020 showed resolution of prior adenopathy.  Scattered subcentimeter lung nodules which were favored to be infectious/inflammatory in etiology.  She was also noted to have multifocal splenic lesions largest measuring 14 mm which was compared to prior scans from April 2020 as well as PET scan from January 2020 and were found to be stable.  She has been presently referred to [place] for splenic hypodensities.  The splenic hypodensities were not FDG avid on prior PET.   PET scan done in August 2022 showed diffuse low-grade hypermetabolic adenopathy in the occipital, parotid, periauricular cervical mediastinal and intra-abdominal regions.  Excisional biopsy showed recurrent marginal zone lymphoma.  Paraspinal soft tissue density in T8-T9 and T10 potentially from heterotopic ossification or tumor  deposition.  Interval history-patient is here with her daughter-in-law today.  She reports feeling at her baseline state of health.  Appetite is fair.  She has ongoing fatigue.  She reports 1 episode of difficulty swallowing when she spit up food but otherwise denies any overt difficulty swallowing.  Her family thinks otherwise.  ECOG PS- 2-3 Pain scale- 0   Review of systems- Review of Systems  Constitutional:  Positive for malaise/fatigue. Negative for chills, fever and weight loss.  HENT:  Negative for congestion, ear discharge and nosebleeds.   Eyes:  Negative for blurred vision.  Respiratory:  Negative for cough, hemoptysis, sputum production, shortness of breath and wheezing.   Cardiovascular:  Negative for chest pain, palpitations, orthopnea and claudication.  Gastrointestinal:  Negative for abdominal pain, blood in stool, constipation, diarrhea, heartburn, melena, nausea and vomiting.  Genitourinary:  Negative for dysuria, flank pain, frequency, hematuria and urgency.  Musculoskeletal:  Negative for back pain, joint pain and myalgias.  Skin:  Negative for rash.  Neurological:  Negative for dizziness, tingling, focal weakness, seizures, weakness and headaches.  Endo/Heme/Allergies:  Does not bruise/bleed easily.  Psychiatric/Behavioral:  Negative for depression and suicidal ideas. The patient does not have insomnia.       Allergies  Allergen Reactions   Ciprofloxacin Itching   Novocain [Procaine]     Doesn't remember reaction     Past Medical History:  Diagnosis Date   Actinic keratosis    Arthritis    Atherosclerosis    Atrial fibrillation (HCC)    CHF (congestive heart failure) ([place])    Coronary artery disease    Dementia ([place])    Dyspnea    Encephalitis 2020   Gout  Hyperlipemia    Hypertension    Hypothyroid    Kidney disease, chronic, stage III (moderate, EGFR 30-59 ml/min) (HCC)    now stage 4   Shingles 2020   Small cell B-cell lymphoma, unspecified  site (HCC)    Squamous cell carcinoma of skin    L arm removed by Dr Phillip Heal    Stroke [facility]) 2018   + 2021.  Peripheral vision damage.   Subdural hemorrhage ([place]) 2020     Past Surgical History:  Procedure Laterality Date   CARDIAC CATHETERIZATION     CATARACT EXTRACTION W/PHACO Left 12/04/2020   Procedure: CATARACT EXTRACTION PHACO AND INTRAOCULAR LENS PLACEMENT (IOC) LEFT 6.95 00:45.9;  Surgeon: Birder Robson, MD;  Location: [place];  Service: Ophthalmology;  Laterality: Left;   CATARACT EXTRACTION W/PHACO Right 12/18/2020   Procedure: CATARACT EXTRACTION PHACO AND INTRAOCULAR LENS PLACEMENT (IOC) RIGHT 7.07 00:42.9;  Surgeon: Birder Robson, MD;  Location: [place];  Service: Ophthalmology;  Laterality: Right;   CHOLECYSTECTOMY  1978   COLONOSCOPY     LYMPH NODE BIOPSY N/A 05/17/2021   Procedure: RIGHT OCCIPITAL LYMPH NODE BIOPSY;  Surgeon: Robert Bellow, MD;  Location: [facility];  Service: General;  Laterality: N/A;  occipital node biopsy possible axillary node biopsy   REPLACEMENT TOTAL KNEE Bilateral    Right 06/02/03,  left 04/20/06   TUBAL LIGATION      Social History   Socioeconomic History   Marital status: Widowed    Spouse name: Not on file   Number of children: Not on file   Years of education: Not on file   Highest education level: Not on file  Occupational History   Not on file  Tobacco Use   Smoking status: Never   Smokeless tobacco: Never  Vaping Use   Vaping Use: Never used  Substance and Sexual Activity   Alcohol use: Yes    Comment: occasional - wine small amount   Drug use: Not Currently   Sexual activity: Not Currently  Other Topics Concern   Not on file  Social History Narrative   Homeplace in [place]   Social Determinants of Health   Financial Resource Strain: Not on file  Food Insecurity: Not on file  Transportation Needs: Not on file  Physical Activity: Not on file  Stress: Not on file  Social  Connections: Not on file  Intimate Partner Violence: Not on file    No family history on file.   Current Outpatient Medications:    acetaminophen (TYLENOL) 500 MG tablet, Take 1 tablet (500 mg total) by mouth every 4 (four) hours as needed for moderate pain., Disp: 30 tablet, Rfl: 0   apixaban (ELIQUIS) 5 MG TABS tablet, Take 1 tablet (5 mg total) by mouth 2 (two) times daily., Disp: 60 tablet, Rfl: 0   carboxymethylcellulose (REFRESH PLUS) 0.5 % SOLN, Place 1 drop into both eyes 3 (three) times daily as needed., Disp: , Rfl:    levothyroxine (SYNTHROID) 50 MCG tablet, Take 50 mcg by mouth daily before breakfast., Disp: , Rfl:    lidocaine (LIDODERM) 5 %, Place 1 patch onto the skin daily. Remove & Discard patch within 12 hours or as directed by MD, Disp: , Rfl:    losartan (COZAAR) 25 MG tablet, Take 25 mg by mouth daily., Disp: , Rfl:    Multiple Vitamins-Iron (MULTIVITAMIN/IRON PO), Take 1 tablet by mouth daily., Disp: , Rfl:    predniSONE (DELTASONE) 5 MG tablet, Take 5 mg by  mouth daily with breakfast., Disp: , Rfl:    rosuvastatin (CRESTOR) 40 MG tablet, Take 40 mg by mouth daily., Disp: , Rfl:    senna (SENOKOT) 8.6 MG TABS tablet, Take 1 tablet by mouth every 12 (twelve) hours as needed for mild constipation. (Patient not taking: Reported on 06/07/2021), Disp: , Rfl:   Physical exam:  Vitals:   06/07/21 1135  BP: 103/68  Pulse: (!) 56  Resp: 16  Temp: 98.1 F (36.7 C)  TempSrc: Oral  Weight: 167 lb (75.8 kg)  Height: 5' 2"  (1.575 m)   Physical Exam Constitutional:      General: She is not in acute distress.    Comments: Sitting in a wheelchair  Cardiovascular:     Rate and Rhythm: Normal rate and regular rhythm.     Heart sounds: Normal heart sounds.  Pulmonary:     Effort: Pulmonary effort is normal.     Breath sounds: Normal breath sounds.  Abdominal:     General: Bowel sounds are normal.     Palpations: Abdomen is soft.  Musculoskeletal:     Comments: Bilateral  +1 edema  Skin:    General: Skin is warm and dry.  Neurological:     Mental Status: She is alert and oriented to person, place, and time.     CMP Latest Ref Rng & Units 06/07/2021  Glucose 70 - 99 mg/dL 98  BUN 8 - 23 mg/dL 25(H)  Creatinine 0.44 - 1.00 mg/dL 2.00(H)  Sodium 135 - 145 mmol/L 140  Potassium 3.5 - 5.1 mmol/L 4.2  Chloride 98 - 111 mmol/L 103  CO2 22 - 32 mmol/L 26  Calcium 8.9 - 10.3 mg/dL 8.9  Total Protein 6.5 - 8.1 g/dL 6.2(L)  Total Bilirubin 0.3 - 1.2 mg/dL 1.0  Alkaline Phos 38 - 126 U/L 62  AST 15 - 41 U/L 65(H)  ALT 0 - 44 U/L 70(H)   CBC Latest Ref Rng & Units 06/07/2021  WBC 4.0 - 10.5 K/uL 2.6(L)  Hemoglobin 12.0 - 15.0 g/dL 9.5(L)  Hematocrit 36.0 - 46.0 % 31.1(L)  Platelets 150 - 400 K/uL 179    Assessment and plan- Patient is a 86 y.o. female with history of prior marginal zone lymphoma now with biopsy-proven recurrence to discuss further management  Patient has received Rituxan and obinutuzumab based treatments in the past.  Family reports that she has tolerated that poorly.  She has also had episode of encephalitis requiring hospitalizations during that time.  She also had blood work done recently at nephrology which showed worsening renal functions as well as anemia.  I will be repeating her CBC CMP as well as anemia work-up and uric acid as well as LDH today.  Marginal zone lymphoma is typically a low-grade lymphoma and her PET scan did not have a high SUV uptake and excisional biopsy also confirmed known large cell transformation.    We discussed using be taking a better such as zanubrutinib in the recurrent setting.  Phase 2 trials evaluating zanubrutinib and 86 patients with relapsed marginal zone lymphoma showed response rate of 68 to 80% with CR 17 to 20% and medium time to response 3 months.  PFS rates were 83 to 84% at 1 year and 72% at 3 years.  Discussed risks and benefits of this drug including all but not limited to low blood counts risk of  infections nausea vomiting diarrhea hypertension and leg swelling.  Written information about the drug has been given to the  patient and we will start working on insurance approval for the same.  Patient and family would like to think about their options and get back to [place].  Patient reports symptoms of urinary frequency and urgency and we did check urinalysis today which was positive.  Based on her renal functions I will plan to give her Augmentin to 50 mg twice daily for 7 days.  Visit Diagnosis 1. Marginal zone B-cell lymphoma ([place])   2. Goals of care, counseling/discussion      Dr. Randa Evens, MD, MPH [facility] at [facility] [phone] 06/08/2021 7:16 AM

## 2021-06-09 ENCOUNTER — Encounter: Payer: Self-pay | Admitting: Oncology

## 2021-07-19 ENCOUNTER — Other Ambulatory Visit: Payer: Medicare HMO

## 2021-07-23 ENCOUNTER — Encounter: Payer: Self-pay | Admitting: Oncology

## 2021-07-23 ENCOUNTER — Inpatient Hospital Stay: Payer: Medicare HMO | Attending: Oncology

## 2021-07-23 ENCOUNTER — Inpatient Hospital Stay (HOSPITAL_BASED_OUTPATIENT_CLINIC_OR_DEPARTMENT_OTHER): Payer: Medicare HMO | Admitting: Oncology

## 2021-07-23 ENCOUNTER — Other Ambulatory Visit: Payer: Self-pay

## 2021-07-23 VITALS — BP 128/72 | HR 57 | Temp 98.3°F | Resp 16

## 2021-07-23 DIAGNOSIS — N189 Chronic kidney disease, unspecified: Secondary | ICD-10-CM

## 2021-07-23 DIAGNOSIS — D472 Monoclonal gammopathy: Secondary | ICD-10-CM

## 2021-07-23 DIAGNOSIS — Z8572 Personal history of non-Hodgkin lymphomas: Secondary | ICD-10-CM | POA: Diagnosis present

## 2021-07-23 DIAGNOSIS — Z862 Personal history of diseases of the blood and blood-forming organs and certain disorders involving the immune mechanism: Secondary | ICD-10-CM | POA: Diagnosis not present

## 2021-07-23 DIAGNOSIS — D631 Anemia in chronic kidney disease: Secondary | ICD-10-CM | POA: Insufficient documentation

## 2021-07-23 DIAGNOSIS — C8580 Other specified types of non-Hodgkin lymphoma, unspecified site: Secondary | ICD-10-CM

## 2021-07-23 DIAGNOSIS — I130 Hypertensive heart and chronic kidney disease with heart failure and stage 1 through stage 4 chronic kidney disease, or unspecified chronic kidney disease: Secondary | ICD-10-CM | POA: Insufficient documentation

## 2021-07-23 DIAGNOSIS — N183 Chronic kidney disease, stage 3 unspecified: Secondary | ICD-10-CM | POA: Diagnosis not present

## 2021-07-23 DIAGNOSIS — D649 Anemia, unspecified: Secondary | ICD-10-CM

## 2021-07-23 LAB — COMPREHENSIVE METABOLIC PANEL
ALT: 66 U/L — ABNORMAL HIGH (ref 0–44)
AST: 87 U/L — ABNORMAL HIGH (ref 15–41)
Albumin: 2.9 g/dL — ABNORMAL LOW (ref 3.5–5.0)
Alkaline Phosphatase: 70 U/L (ref 38–126)
Anion gap: 12 (ref 5–15)
BUN: 25 mg/dL — ABNORMAL HIGH (ref 8–23)
CO2: 25 mmol/L (ref 22–32)
Calcium: 8.7 mg/dL — ABNORMAL LOW (ref 8.9–10.3)
Chloride: 102 mmol/L (ref 98–111)
Creatinine, Ser: 2.03 mg/dL — ABNORMAL HIGH (ref 0.44–1.00)
GFR, Estimated: 24 mL/min — ABNORMAL LOW (ref >60)
Glucose, Bld: 113 mg/dL — ABNORMAL HIGH (ref 70–99)
Potassium: 3.6 mmol/L (ref 3.5–5.1)
Sodium: 139 mmol/L (ref 135–145)
Total Bilirubin: 0.7 mg/dL (ref 0.3–1.2)
Total Protein: 6.1 g/dL — ABNORMAL LOW (ref 6.5–8.1)

## 2021-07-23 LAB — CBC WITH DIFFERENTIAL/PLATELET
Abs Immature Granulocytes: 0.03 10*3/uL (ref 0.00–0.07)
Basophils Absolute: 0.1 10*3/uL (ref 0.0–0.1)
Basophils Relative: 2 %
Eosinophils Absolute: 0.1 10*3/uL (ref 0.0–0.5)
Eosinophils Relative: 3 %
HCT: 29.6 % — ABNORMAL LOW (ref 36.0–46.0)
Hemoglobin: 9 g/dL — ABNORMAL LOW (ref 12.0–15.0)
Immature Granulocytes: 1 %
Lymphocytes Relative: 6 %
Lymphs Abs: 0.2 10*3/uL — ABNORMAL LOW (ref 0.7–4.0)
MCH: 28.1 pg (ref 26.0–34.0)
MCHC: 30.4 g/dL (ref 30.0–36.0)
MCV: 92.5 fL (ref 80.0–100.0)
Monocytes Absolute: 0.3 10*3/uL (ref 0.1–1.0)
Monocytes Relative: 9 %
Neutro Abs: 2.4 10*3/uL (ref 1.7–7.7)
Neutrophils Relative %: 79 %
Platelets: 166 10*3/uL (ref 150–400)
RBC: 3.2 MIL/uL — ABNORMAL LOW (ref 3.87–5.11)
RDW: 16.3 % — ABNORMAL HIGH (ref 11.5–15.5)
WBC: 3 10*3/uL — ABNORMAL LOW (ref 4.0–10.5)
nRBC: 0 % (ref 0.0–0.2)

## 2021-07-23 LAB — IRON AND TIBC
Iron: 32 ug/dL (ref 28–170)
Saturation Ratios: 10 % — ABNORMAL LOW (ref 10.4–31.8)
TIBC: 312 ug/dL (ref 250–450)
UIBC: 280 ug/dL

## 2021-07-23 LAB — VITAMIN B12: Vitamin B-12: 725 pg/mL (ref 180–914)

## 2021-07-23 LAB — FERRITIN: Ferritin: 50 ng/mL (ref 11–307)

## 2021-07-23 LAB — LACTATE DEHYDROGENASE: LDH: 408 U/L — ABNORMAL HIGH (ref 98–192)

## 2021-07-23 NOTE — Progress Notes (Signed)
Hematology/Oncology Consult note [facility]  Telephone:([phone] [phone]  Patient Care Team: Leonel Ramsay, MD as PCP - General (Infectious Diseases)   Name of the patient: Penny White  [id number]  1935-09-06   Date of visit: 07/23/21  Diagnosis-recurrent nodal marginal zone lymphoma  Chief complaint/ Reason for visit- routine follow-up of marginal zone lymphoma and anemia  Heme/Onc history: Patient is a 86 year old female with a past medical history significant for chronic atrial fibrillation, heart failure, B12 deficiency, CVA among other medical problems.  She also has a diagnosis of marginal zone lymphoma for which she was on Bendamustine Rituxan and then maintenance Rituxan up until August 2019.  She then had progression on rituximab and received cycle 6 of obinutuzumab in July 2020 along with Revlimid.  Revlimid was discontinued in April 2020 due to bradycardia.  She has also received IVIG in the past but none since October 2021.  Her last CT chest abdomen pelvis with contrast in September 2020 showed resolution of prior adenopathy.  Scattered subcentimeter lung nodules which were favored to be infectious/inflammatory in etiology.  She was also noted to have multifocal splenic lesions largest measuring 14 mm which was compared to prior scans from April 2020 as well as PET scan from January 2020 and were found to be stable.  She has been presently referred to [place] for splenic hypodensities.  The splenic hypodensities were not FDG avid on prior PET.   PET scan done in August 2022 showed diffuse low-grade hypermetabolic adenopathy in the occipital, parotid, periauricular cervical mediastinal and intra-abdominal regions.  Excisional biopsy showed recurrent marginal zone lymphoma.  Paraspinal soft tissue density in T8-T9 and T10 potentially from heterotopic ossification or tumor deposition.    Interval history-patient is here with her son  today.  She lives at assisted living facility.  She reports baseline fatigue.  Denies any recent changes in her appetite or weight.  ECOG PS- 2-3 Pain scale- 0   Review of systems- Review of Systems  Constitutional:  Positive for malaise/fatigue. Negative for chills, fever and weight loss.  HENT:  Negative for congestion, ear discharge and nosebleeds.   Eyes:  Negative for blurred vision.  Respiratory:  Negative for cough, hemoptysis, sputum production, shortness of breath and wheezing.   Cardiovascular:  Negative for chest pain, palpitations, orthopnea and claudication.  Gastrointestinal:  Negative for abdominal pain, blood in stool, constipation, diarrhea, heartburn, melena, nausea and vomiting.  Genitourinary:  Negative for dysuria, flank pain, frequency, hematuria and urgency.  Musculoskeletal:  Negative for back pain, joint pain and myalgias.  Skin:  Negative for rash.  Neurological:  Negative for dizziness, tingling, focal weakness, seizures, weakness and headaches.  Endo/Heme/Allergies:  Does not bruise/bleed easily.  Psychiatric/Behavioral:  Negative for depression and suicidal ideas. The patient does not have insomnia.      Allergies  Allergen Reactions   Ciprofloxacin Itching   Novocain [Procaine]     Doesn't remember reaction     Past Medical History:  Diagnosis Date   Actinic keratosis    Arthritis    Atherosclerosis    Atrial fibrillation (HCC)    CHF (congestive heart failure) (HCC)    Coronary artery disease    Dementia (HCC)    Dyspnea    Encephalitis 2020   Gout    Hyperlipemia    Hypertension    Hypothyroid    Kidney disease, chronic, stage III (moderate, EGFR 30-59 ml/min) (HCC)    now stage 4  Shingles 2020   Small cell B-cell lymphoma, unspecified site (HCC)    Squamous cell carcinoma of skin    L arm removed by Dr Phillip Heal    Stroke [facility]) 2018   + 2021.  Peripheral vision damage.   Subdural hemorrhage ([place]) 2020     Past Surgical History:   Procedure Laterality Date   CARDIAC CATHETERIZATION     CATARACT EXTRACTION W/PHACO Left 12/04/2020   Procedure: CATARACT EXTRACTION PHACO AND INTRAOCULAR LENS PLACEMENT (IOC) LEFT 6.95 00:45.9;  Surgeon: Birder Robson, MD;  Location: [place];  Service: Ophthalmology;  Laterality: Left;   CATARACT EXTRACTION W/PHACO Right 12/18/2020   Procedure: CATARACT EXTRACTION PHACO AND INTRAOCULAR LENS PLACEMENT (IOC) RIGHT 7.07 00:42.9;  Surgeon: Birder Robson, MD;  Location: [place];  Service: Ophthalmology;  Laterality: Right;   CHOLECYSTECTOMY  1978   COLONOSCOPY     LYMPH NODE BIOPSY N/A 05/17/2021   Procedure: RIGHT OCCIPITAL LYMPH NODE BIOPSY;  Surgeon: Robert Bellow, MD;  Location: [facility];  Service: General;  Laterality: N/A;  occipital node biopsy possible axillary node biopsy   REPLACEMENT TOTAL KNEE Bilateral    Right 06/02/03,  left 04/20/06   TUBAL LIGATION      Social History   Socioeconomic History   Marital status: Widowed    Spouse name: Not on file   Number of children: Not on file   Years of education: Not on file   Highest education level: Not on file  Occupational History   Not on file  Tobacco Use   Smoking status: Never   Smokeless tobacco: Never  Vaping Use   Vaping Use: Never used  Substance and Sexual Activity   Alcohol use: Yes    Comment: occasional - wine small amount   Drug use: Not Currently   Sexual activity: Not Currently  Other Topics Concern   Not on file  Social History Narrative   Homeplace in [place]   Social Determinants of Health   Financial Resource Strain: Not on file  Food Insecurity: Not on file  Transportation Needs: Not on file  Physical Activity: Not on file  Stress: Not on file  Social Connections: Not on file  Intimate Partner Violence: Not on file    No family history on file.   Current Outpatient Medications:    acetaminophen (TYLENOL) 500 MG tablet, Take 1 tablet (500 mg total)  by mouth every 4 (four) hours as needed for moderate pain., Disp: 30 tablet, Rfl: 0   apixaban (ELIQUIS) 5 MG TABS tablet, Take 1 tablet (5 mg total) by mouth 2 (two) times daily., Disp: 60 tablet, Rfl: 0   carboxymethylcellulose (REFRESH PLUS) 0.5 % SOLN, Place 1 drop into both eyes 3 (three) times daily as needed., Disp: , Rfl:    levothyroxine (SYNTHROID) 50 MCG tablet, Take 50 mcg by mouth daily before breakfast., Disp: , Rfl:    lidocaine (LIDODERM) 5 %, Place 1 patch onto the skin daily. Remove & Discard patch within 12 hours or as directed by MD, Disp: , Rfl:    losartan (COZAAR) 25 MG tablet, Take 25 mg by mouth daily., Disp: , Rfl:    Multiple Vitamins-Iron (MULTIVITAMIN/IRON PO), Take 1 tablet by mouth daily., Disp: , Rfl:    predniSONE (DELTASONE) 5 MG tablet, Take 5 mg by mouth daily with breakfast., Disp: , Rfl:    rosuvastatin (CRESTOR) 40 MG tablet, Take 40 mg by mouth daily., Disp: , Rfl:   Physical exam:  Vitals:  07/23/21 1207  BP: 128/72  Pulse: (!) 57  Resp: 16  Temp: 98.3 F (36.8 C)  TempSrc: Tympanic   Physical Exam Constitutional:      General: She is not in acute distress.    Comments: Sitting in a wheelchair.  Appears frail and fatigued.  Exam is somewhat limited as she is frail and unable to sit up on the examination table.  Cardiovascular:     Rate and Rhythm: Normal rate and regular rhythm.     Heart sounds: Normal heart sounds.  Pulmonary:     Effort: Pulmonary effort is normal.     Breath sounds: Normal breath sounds.  Abdominal:     General: Bowel sounds are normal.     Palpations: Abdomen is soft.  Musculoskeletal:     Comments: Few scattered petechiae noted over bilateral lower extremities  Lymphadenopathy:     Comments: She has palpable submandibular adenopathy.  No palpable bilateral axillary adenopathy or inguinal adenopathy.  Skin:    General: Skin is warm and dry.  Neurological:     Mental Status: She is alert and oriented to person,  place, and time.     CMP Latest Ref Rng & Units 07/23/2021  Glucose 70 - 99 mg/dL 113(H)  BUN 8 - 23 mg/dL 25(H)  Creatinine 0.44 - 1.00 mg/dL 2.03(H)  Sodium 135 - 145 mmol/L 139  Potassium 3.5 - 5.1 mmol/L 3.6  Chloride 98 - 111 mmol/L 102  CO2 22 - 32 mmol/L 25  Calcium 8.9 - 10.3 mg/dL 8.7(L)  Total Protein 6.5 - 8.1 g/dL 6.1(L)  Total Bilirubin 0.3 - 1.2 mg/dL 0.7  Alkaline Phos 38 - 126 U/L 70  AST 15 - 41 U/L 87(H)  ALT 0 - 44 U/L 66(H)   CBC Latest Ref Rng & Units 07/23/2021  WBC 4.0 - 10.5 K/uL 3.0(L)  Hemoglobin 12.0 - 15.0 g/dL 9.0(L)  Hematocrit 36.0 - 46.0 % 29.6(L)  Platelets 150 - 400 K/uL 166   Assessment and plan- Patient is a 86 y.o. female who is here for follow-up of following issues:  Recurrent nodal marginal zone lymphoma: PET scan in August 2022 did not show evidence of diffuse nonbulky adenopathy suggestive of recurrence.  LDH is elevated in the 400s as compared to her baseline of 200s.  I have previously discussed considering treatment at this time and given her age and frailty as well as poor tolerance to other treatments in the past; BTK inhibitors remains an option and she was already approved for zanubrutinib.  However after reviewing side effects of the drug patient has chosen not to proceed with treatment just yet.  I would therefore recommend continued monitoring of her lymphoma with a repeat CBC with differential CMP LDH in 6 weeks and 12 weeks and I will see her back in 12 weeks.  I will consider getting a repeat PET CT scan at that time.  Patient is also going to be seen by Dr. Donzetta Matters from [facility] for second opinion.  She has seen him in the past and received treatment from him for her lymphoma.  2.  Anemia of chronic kidney disease: Patient has ongoing normocytic anemia with a hemoglobin which is remained stable in the nines over the last 3 months.  Iron studies are indicated on perjeta of iron deficiency given that her ferritin is less than 100 and iron  saturation less than 20% in the setting of CKD.  It would therefore be reasonable for her to proceed with oral  iron every other day.  We did discuss about IV iron but she prefers to do oral iron at this time.  If hemoglobin does not improve despite adequate iron stores giving Epo is an option down the line.   Total face to face encounter time for this patient visit was 35 min.    Visit Diagnosis 1. Marginal zone B-cell lymphoma ([place])   2. History of anemia due to chronic kidney disease      Dr. Randa Evens, MD, MPH [facility] at [facility] [phone] 07/23/2021 4:49 PM

## 2021-07-23 NOTE — Progress Notes (Signed)
Pt here to go over labs. She has had ankle swelling and has chronic kidney disease and  was given fluid pill from nephrology and they are seeing them next week the son thinks. He has noticed red spots on the ankles  and some on the feet- wants md to look at . There is no itching.

## 2021-07-24 ENCOUNTER — Telehealth: Payer: Self-pay

## 2021-07-24 ENCOUNTER — Ambulatory Visit: Payer: Medicare HMO | Admitting: Dermatology

## 2021-07-24 NOTE — Telephone Encounter (Signed)
Informed pt of Dr. Elroy Channel recommendation of oral iron, son agreed for pt to start oral iron. Will like for [place] to send a letter via MyChart   to show the facility the order as well. Informed son 325mg  OTC iron. Will send letter via [place].

## 2021-07-30 ENCOUNTER — Encounter: Payer: Self-pay | Admitting: Dermatology

## 2021-07-30 ENCOUNTER — Ambulatory Visit (INDEPENDENT_AMBULATORY_CARE_PROVIDER_SITE_OTHER): Payer: Medicare HMO | Admitting: Dermatology

## 2021-07-30 ENCOUNTER — Other Ambulatory Visit: Payer: Self-pay

## 2021-07-30 DIAGNOSIS — L98491 Non-pressure chronic ulcer of skin of other sites limited to breakdown of skin: Secondary | ICD-10-CM | POA: Diagnosis not present

## 2021-07-30 DIAGNOSIS — T1490XD Injury, unspecified, subsequent encounter: Secondary | ICD-10-CM | POA: Diagnosis not present

## 2021-07-30 DIAGNOSIS — L97521 Non-pressure chronic ulcer of other part of left foot limited to breakdown of skin: Secondary | ICD-10-CM | POA: Diagnosis not present

## 2021-07-30 DIAGNOSIS — L89620 Pressure ulcer of left heel, unstageable: Secondary | ICD-10-CM

## 2021-07-30 DIAGNOSIS — L821 Other seborrheic keratosis: Secondary | ICD-10-CM

## 2021-07-30 DIAGNOSIS — L820 Inflamed seborrheic keratosis: Secondary | ICD-10-CM | POA: Diagnosis not present

## 2021-07-30 DIAGNOSIS — L720 Epidermal cyst: Secondary | ICD-10-CM | POA: Diagnosis not present

## 2021-07-30 MED ORDER — MUPIROCIN 2 % EX OINT: TOPICAL_OINTMENT | CUTANEOUS | 1 refills | Status: DC

## 2021-07-30 NOTE — Progress Notes (Signed)
Follow-Up Visit   Subjective  Penny White is a 86 y.o. female who presents for the following: irritated skin lesions (On the back - irritated and tender, patient would like them checked today ), traumatic wound (On the L pretibia - has been there for a few weeks. Patient accidentally hit her leg on a wheel chair and it is taking a long time to heal.), wound (On the R lower abdomen/groin - patient was keeping covered with gauze but was told doing so may cause and infection. She would like area evaluated today. ), and tenderness (On the L heel - patient states that area is tender to the touch and she would like it evaluated today.).  The following portions of the chart were reviewed this encounter and updated as appropriate:   Tobacco  Allergies  Meds  Problems  Med Hx  Surg Hx  Fam Hx     Review of Systems:  No other skin or systemic complaints except as noted in HPI or Assessment and Plan.  Objective  Well appearing patient in no apparent distress; mood and affect are within normal limits.  A focused examination was performed including the back, lower legs, abdomen, and L foot. Relevant physical exam findings are noted in the Assessment and Plan.  R lower abdomen/groin Healing ulceration.  L pretibia Healing ulceration.  Upper back x 1 Erythematous keratotic or waxy stuck-on papule or plaque.   R post base of neck 4.0 x 3.0 cm firm SQ nodule.   L heel Erythema and peeling.   Assessment & Plan  Skin ulceration / fissure, limited to breakdown of skin (HCC) R lower abdomen/groin Start Mupirocin 2% ointment to aa QD until healed.   Ulcerated traumatic wound with crust L pretibia Due to trauma -  Start Mupirocin 2% ointment to aa's QD and cover with non-stick gauze and Coban. Do this daily until healed. Discussed with patient that wounds on the lower legs take longer to heal. mupirocin ointment (BACTROBAN) 2 % - L pretibia Apply to wounds QD until  healed.  Inflamed seborrheic keratosis Upper back x 1 Destruction of lesion - Upper back x 1 Complexity: simple   Destruction method: cryotherapy   Informed consent: discussed and consent obtained   Timeout:  patient name, date of birth, surgical site, and procedure verified Lesion destroyed using liquid nitrogen: Yes   Region frozen until ice ball extended beyond lesion: Yes   Outcome: patient tolerated procedure well with no complications   Post-procedure details: wound care instructions given    Epidermal inclusion cyst R post base of neck Benign-appearing. Exam most consistent with an epidermal inclusion cyst. Discussed that a cyst is a benign growth that can grow over time and sometimes get irritated or inflamed. Recommend observation if it is not bothersome. Discussed option of surgical excision to remove it if it is growing, symptomatic, or other changes noted. Please call for new or changing lesions so they can be evaluated. Asymptomatic - observe.  Pt declines treatment for now.  Foot ulceration, left, limited to breakdown of skin (HCC) L heel Impending pressure ulcer -  Start Duoderm extra thin. Clean area with warm water and soap, pat dry, then apply Duoderm extra thin (cut to fit). Leave on for 3-4 days then repeat process. Patch given to patient today.   Seborrheic Keratoses - Stuck-on, waxy, tan-brown papules and/or plaques  - Benign-appearing - Discussed benign etiology and prognosis. - Observe - Call for any changes  Return in about  1 month (around 08/29/2021) for wound recheck and ISK f/u .  Luther Redo, CMA, am acting as scribe for Sarina Ser, MD . Documentation: I have reviewed the above documentation for accuracy and completeness, and I agree with the above.  Sarina Ser, MD

## 2021-07-30 NOTE — Patient Instructions (Addendum)
For the wound on the left lower leg clean once daily with warm water and soap. Pat dry then apply Mupirocin 2% ointment, cover with a non-stick gauze, and wrap in Coban. Do this daily until healed.  For the wound on the right lower abdomen/groin area clean daily with warm water and soap, pat dry, then apply Mupirocin 2% ointment and cover with a non-stick gauze. Do this daily until healed.  For the irritated seborrheic keratosis that was treated with liquid nitrogen on the back clean once daily with warm water and soap. Pat dry, then apply a thin coat Mupirocin 2% ointment, and cover with a bandage once daily until healed.   For the pressure ulceration on the left heel clean with warm water and soap, pat dry, then cut Duoderm bandage to fit area and apply. Leave on for 3-4 days then repeat process and replace with new Duoderm patch.   Please contact our office with any questions or concerns at [phone]   Thank you.   Sarina Ser MD   If you have any questions or concerns for your doctor, please call our main line at [phone] and press option 4 to reach your doctor's medical assistant. If no one answers, please leave a voicemail as directed and we will return your call as soon as possible. Messages left after 4 pm will be answered the following business day.   You may also send [place] a message via [place]. We typically respond to MyChart messages within 1-2 business days.  For prescription refills, please ask your pharmacy to contact our office. Our fax number is [phone].  If you have an urgent issue when the clinic is closed that cannot wait until the next business day, you can page your doctor at the number below.    Please note that while we do our best to be available for urgent issues outside of office hours, we are not available 24/7.   If you have an urgent issue and are unable to reach [place], you may choose to seek medical care at your doctor's office, retail clinic, urgent  care center, or emergency room.  If you have a medical emergency, please immediately call 911 or go to the emergency department.  Pager Numbers  - Dr. Nehemiah Massed: [phone]  - Dr. Laurence Ferrari: [phone]  - Dr. Nicole Kindred: [phone]  In the event of inclement weather, please call our main line at [phone] for an update on the status of any delays or closures.  Dermatology Medication Tips: Please keep the boxes that topical medications come in in order to help keep track of the instructions about where and how to use these. Pharmacies typically print the medication instructions only on the boxes and not directly on the medication tubes.   If your medication is too expensive, please contact our office at [phone] option 4 or send [place] a message through [place].   We are unable to tell what your co-pay for medications will be in advance as this is different depending on your insurance coverage. However, we may be able to find a substitute medication at lower cost or fill out paperwork to get insurance to cover a needed medication.   If a prior authorization is required to get your medication covered by your insurance company, please allow [place] 1-2 business days to complete this process.  Drug prices often vary depending on where the prescription is filled and some pharmacies may offer cheaper prices.  The website www.goodrx.com contains coupons for medications through different  pharmacies. The prices here do not account for what the cost may be with help from insurance (it may be cheaper with your insurance), but the website can give you the price if you did not use any insurance.  - You can print the associated coupon and take it with your prescription to the pharmacy.  - You may also stop by our office during regular business hours and pick up a GoodRx coupon card.  - If you need your prescription sent electronically to a different pharmacy, notify our office through [facility] or  by phone at [phone] option 4.

## 2021-07-31 ENCOUNTER — Emergency Department: Payer: Medicare HMO

## 2021-07-31 ENCOUNTER — Other Ambulatory Visit: Payer: Self-pay

## 2021-07-31 ENCOUNTER — Inpatient Hospital Stay
Admission: EM | Admit: 2021-07-31 | Discharge: 2021-08-03 | DRG: 641 | Disposition: A | Discharge status: Home Health | Payer: Medicare HMO | Source: Skilled Nursing Facility | Attending: Student in an Organized Health Care Education/Training Program | Admitting: Student in an Organized Health Care Education/Training Program

## 2021-07-31 DIAGNOSIS — E872 Acidosis, unspecified: Secondary | ICD-10-CM | POA: Diagnosis present

## 2021-07-31 DIAGNOSIS — R319 Hematuria, unspecified: Secondary | ICD-10-CM | POA: Diagnosis present

## 2021-07-31 DIAGNOSIS — R7401 Elevation of levels of liver transaminase levels: Secondary | ICD-10-CM | POA: Diagnosis present

## 2021-07-31 DIAGNOSIS — R531 Weakness: Secondary | ICD-10-CM

## 2021-07-31 DIAGNOSIS — R778 Other specified abnormalities of plasma proteins: Secondary | ICD-10-CM | POA: Diagnosis present

## 2021-07-31 DIAGNOSIS — N390 Urinary tract infection, site not specified: Secondary | ICD-10-CM

## 2021-07-31 DIAGNOSIS — C8300 Small cell B-cell lymphoma, unspecified site: Secondary | ICD-10-CM

## 2021-07-31 DIAGNOSIS — I952 Hypotension due to drugs: Secondary | ICD-10-CM | POA: Diagnosis present

## 2021-07-31 DIAGNOSIS — M109 Gout, unspecified: Secondary | ICD-10-CM | POA: Diagnosis present

## 2021-07-31 DIAGNOSIS — N189 Chronic kidney disease, unspecified: Secondary | ICD-10-CM | POA: Diagnosis not present

## 2021-07-31 DIAGNOSIS — I2510 Atherosclerotic heart disease of native coronary artery without angina pectoris: Secondary | ICD-10-CM | POA: Diagnosis not present

## 2021-07-31 DIAGNOSIS — Z881 Allergy status to other antibiotic agents status: Secondary | ICD-10-CM

## 2021-07-31 DIAGNOSIS — Z8661 Personal history of infections of the central nervous system: Secondary | ICD-10-CM

## 2021-07-31 DIAGNOSIS — E860 Dehydration: Principal | ICD-10-CM

## 2021-07-31 DIAGNOSIS — M353 Polymyalgia rheumatica: Secondary | ICD-10-CM | POA: Diagnosis present

## 2021-07-31 DIAGNOSIS — N183 Chronic kidney disease, stage 3 unspecified: Secondary | ICD-10-CM | POA: Diagnosis not present

## 2021-07-31 DIAGNOSIS — I130 Hypertensive heart and chronic kidney disease with heart failure and stage 1 through stage 4 chronic kidney disease, or unspecified chronic kidney disease: Secondary | ICD-10-CM | POA: Diagnosis present

## 2021-07-31 DIAGNOSIS — F0390 Unspecified dementia without behavioral disturbance: Secondary | ICD-10-CM | POA: Diagnosis present

## 2021-07-31 DIAGNOSIS — Z886 Allergy status to analgesic agent status: Secondary | ICD-10-CM

## 2021-07-31 DIAGNOSIS — D631 Anemia in chronic kidney disease: Secondary | ICD-10-CM | POA: Diagnosis present

## 2021-07-31 DIAGNOSIS — R001 Bradycardia, unspecified: Secondary | ICD-10-CM

## 2021-07-31 DIAGNOSIS — I248 Other forms of acute ischemic heart disease: Secondary | ICD-10-CM | POA: Diagnosis present

## 2021-07-31 DIAGNOSIS — I4819 Other persistent atrial fibrillation: Secondary | ICD-10-CM

## 2021-07-31 DIAGNOSIS — I959 Hypotension, unspecified: Secondary | ICD-10-CM

## 2021-07-31 DIAGNOSIS — I482 Chronic atrial fibrillation, unspecified: Secondary | ICD-10-CM | POA: Diagnosis not present

## 2021-07-31 DIAGNOSIS — Z66 Do not resuscitate: Secondary | ICD-10-CM | POA: Diagnosis present

## 2021-07-31 DIAGNOSIS — I77810 Thoracic aortic ectasia: Secondary | ICD-10-CM | POA: Diagnosis present

## 2021-07-31 DIAGNOSIS — N2581 Secondary hyperparathyroidism of renal origin: Secondary | ICD-10-CM | POA: Diagnosis present

## 2021-07-31 DIAGNOSIS — E039 Hypothyroidism, unspecified: Secondary | ICD-10-CM

## 2021-07-31 DIAGNOSIS — I5032 Chronic diastolic (congestive) heart failure: Secondary | ICD-10-CM | POA: Diagnosis present

## 2021-07-31 DIAGNOSIS — N179 Acute kidney failure, unspecified: Secondary | ICD-10-CM | POA: Diagnosis present

## 2021-07-31 DIAGNOSIS — R7989 Other specified abnormal findings of blood chemistry: Secondary | ICD-10-CM

## 2021-07-31 DIAGNOSIS — E876 Hypokalemia: Secondary | ICD-10-CM | POA: Diagnosis present

## 2021-07-31 DIAGNOSIS — Z7901 Long term (current) use of anticoagulants: Secondary | ICD-10-CM | POA: Diagnosis not present

## 2021-07-31 DIAGNOSIS — N1832 Chronic kidney disease, stage 3b: Secondary | ICD-10-CM | POA: Diagnosis not present

## 2021-07-31 DIAGNOSIS — I10 Essential (primary) hypertension: Secondary | ICD-10-CM | POA: Diagnosis present

## 2021-07-31 DIAGNOSIS — Z20822 Contact with and (suspected) exposure to covid-19: Secondary | ICD-10-CM | POA: Diagnosis present

## 2021-07-31 DIAGNOSIS — E861 Hypovolemia: Secondary | ICD-10-CM | POA: Diagnosis present

## 2021-07-31 DIAGNOSIS — N184 Chronic kidney disease, stage 4 (severe): Secondary | ICD-10-CM | POA: Diagnosis present

## 2021-07-31 DIAGNOSIS — E785 Hyperlipidemia, unspecified: Secondary | ICD-10-CM | POA: Diagnosis present

## 2021-07-31 DIAGNOSIS — M1990 Unspecified osteoarthritis, unspecified site: Secondary | ICD-10-CM | POA: Diagnosis present

## 2021-07-31 DIAGNOSIS — T501X5A Adverse effect of loop [high-ceiling] diuretics, initial encounter: Secondary | ICD-10-CM | POA: Diagnosis present

## 2021-07-31 DIAGNOSIS — Z79890 Hormone replacement therapy (postmenopausal): Secondary | ICD-10-CM

## 2021-07-31 DIAGNOSIS — I25118 Atherosclerotic heart disease of native coronary artery with other forms of angina pectoris: Secondary | ICD-10-CM | POA: Diagnosis not present

## 2021-07-31 DIAGNOSIS — R591 Generalized enlarged lymph nodes: Secondary | ICD-10-CM

## 2021-07-31 DIAGNOSIS — I4821 Permanent atrial fibrillation: Secondary | ICD-10-CM | POA: Diagnosis present

## 2021-07-31 DIAGNOSIS — Z8673 Personal history of transient ischemic attack (TIA), and cerebral infarction without residual deficits: Secondary | ICD-10-CM

## 2021-07-31 DIAGNOSIS — R262 Difficulty in walking, not elsewhere classified: Secondary | ICD-10-CM | POA: Diagnosis present

## 2021-07-31 DIAGNOSIS — I5030 Unspecified diastolic (congestive) heart failure: Secondary | ICD-10-CM | POA: Diagnosis present

## 2021-07-31 DIAGNOSIS — N182 Chronic kidney disease, stage 2 (mild): Secondary | ICD-10-CM | POA: Diagnosis not present

## 2021-07-31 DIAGNOSIS — Z85828 Personal history of other malignant neoplasm of skin: Secondary | ICD-10-CM

## 2021-07-31 DIAGNOSIS — Z79899 Other long term (current) drug therapy: Secondary | ICD-10-CM

## 2021-07-31 DIAGNOSIS — Z7952 Long term (current) use of systemic steroids: Secondary | ICD-10-CM

## 2021-07-31 DIAGNOSIS — Z96653 Presence of artificial knee joint, bilateral: Secondary | ICD-10-CM | POA: Diagnosis present

## 2021-07-31 DIAGNOSIS — Z9049 Acquired absence of other specified parts of digestive tract: Secondary | ICD-10-CM

## 2021-07-31 HISTORY — DX: Polymyalgia rheumatica: M35.3

## 2021-07-31 HISTORY — DX: Permanent atrial fibrillation: I48.21

## 2021-07-31 HISTORY — DX: Other specified types of non-hodgkin lymphoma, unspecified site: C85.80

## 2021-07-31 HISTORY — DX: Chronic kidney disease, stage 4 (severe): N18.4

## 2021-07-31 HISTORY — DX: Chronic diastolic (congestive) heart failure: I50.32

## 2021-07-31 HISTORY — DX: Monoclonal gammopathy: D47.2

## 2021-07-31 LAB — CBC WITH DIFFERENTIAL/PLATELET
Abs Immature Granulocytes: 0.05 10*3/uL (ref 0.00–0.07)
Basophils Absolute: 0.1 10*3/uL (ref 0.0–0.1)
Basophils Relative: 2 %
Eosinophils Absolute: 0 10*3/uL (ref 0.0–0.5)
Eosinophils Relative: 1 %
HCT: 29.4 % — ABNORMAL LOW (ref 36.0–46.0)
Hemoglobin: 8.9 g/dL — ABNORMAL LOW (ref 12.0–15.0)
Immature Granulocytes: 1 %
Lymphocytes Relative: 7 %
Lymphs Abs: 0.3 10*3/uL — ABNORMAL LOW (ref 0.7–4.0)
MCH: 27.4 pg (ref 26.0–34.0)
MCHC: 30.3 g/dL (ref 30.0–36.0)
MCV: 90.5 fL (ref 80.0–100.0)
Monocytes Absolute: 0.5 10*3/uL (ref 0.1–1.0)
Monocytes Relative: 11 %
Neutro Abs: 3.2 10*3/uL (ref 1.7–7.7)
Neutrophils Relative %: 78 %
Platelets: 192 10*3/uL (ref 150–400)
RBC: 3.25 MIL/uL — ABNORMAL LOW (ref 3.87–5.11)
RDW: 16.2 % — ABNORMAL HIGH (ref 11.5–15.5)
WBC: 4.1 10*3/uL (ref 4.0–10.5)
nRBC: 0 % (ref 0.0–0.2)

## 2021-07-31 LAB — URINALYSIS, COMPLETE (UACMP) WITH MICROSCOPIC
Bilirubin Urine: NEGATIVE
Glucose, UA: NEGATIVE mg/dL
Ketones, ur: NEGATIVE mg/dL
Nitrite: NEGATIVE
Protein, ur: 30 mg/dL — AB
Specific Gravity, Urine: 1.006 (ref 1.005–1.030)
pH: 6 (ref 5.0–8.0)

## 2021-07-31 LAB — LACTIC ACID, PLASMA: Lactic Acid, Venous: 2.4 mmol/L (ref 0.5–1.9)

## 2021-07-31 LAB — COMPREHENSIVE METABOLIC PANEL
ALT: 133 U/L — ABNORMAL HIGH (ref 0–44)
AST: 191 U/L — ABNORMAL HIGH (ref 15–41)
Albumin: 2.4 g/dL — ABNORMAL LOW (ref 3.5–5.0)
Alkaline Phosphatase: 63 U/L (ref 38–126)
Anion gap: 14 (ref 5–15)
BUN: 47 mg/dL — ABNORMAL HIGH (ref 8–23)
CO2: 26 mmol/L (ref 22–32)
Calcium: 8.5 mg/dL — ABNORMAL LOW (ref 8.9–10.3)
Chloride: 96 mmol/L — ABNORMAL LOW (ref 98–111)
Creatinine, Ser: 2.74 mg/dL — ABNORMAL HIGH (ref 0.44–1.00)
GFR, Estimated: 17 mL/min — ABNORMAL LOW (ref >60)
Glucose, Bld: 114 mg/dL — ABNORMAL HIGH (ref 70–99)
Potassium: 3.6 mmol/L (ref 3.5–5.1)
Sodium: 136 mmol/L (ref 135–145)
Total Bilirubin: 0.8 mg/dL (ref 0.3–1.2)
Total Protein: 5.7 g/dL — ABNORMAL LOW (ref 6.5–8.1)

## 2021-07-31 LAB — TSH: TSH: 1.066 u[IU]/mL (ref 0.350–4.500)

## 2021-07-31 LAB — RESP PANEL BY RT-PCR (FLU A&B, COVID) ARPGX2
Influenza A by PCR: NEGATIVE
Influenza B by PCR: NEGATIVE
SARS Coronavirus 2 by RT PCR: NEGATIVE

## 2021-07-31 LAB — TROPONIN I (HIGH SENSITIVITY)
Troponin I (High Sensitivity): 144 ng/L (ref <18)
Troponin I (High Sensitivity): 139 ng/L (ref <18)

## 2021-07-31 LAB — BRAIN NATRIURETIC PEPTIDE: B Natriuretic Peptide: 274.2 pg/mL — ABNORMAL HIGH (ref 0.0–100.0)

## 2021-07-31 MED ORDER — SODIUM CHLORIDE 0.9 % IV BOLUS
500.0000 mL | Freq: Once | INTRAVENOUS | Status: AC
  Administered 2021-07-31: 500 mL via INTRAVENOUS

## 2021-07-31 MED ORDER — ONDANSETRON HCL 4 MG PO TABS: 4.0000 mg | ORAL_TABLET | Freq: Four times a day (QID) | ORAL | Status: DC | PRN

## 2021-07-31 MED ORDER — ACETAMINOPHEN 325 MG PO TABS: 650.0000 mg | ORAL_TABLET | Freq: Four times a day (QID) | ORAL | Status: DC | PRN

## 2021-07-31 MED ORDER — ASPIRIN 81 MG PO CHEW
324.0000 mg | CHEWABLE_TABLET | Freq: Once | ORAL | Status: AC
Start: 2021-07-31 — End: 2021-07-31
  Administered 2021-07-31: 324 mg via ORAL
  Filled 2021-07-31: qty 4

## 2021-07-31 MED ORDER — ACETAMINOPHEN 650 MG RE SUPP
650.0000 mg | Freq: Four times a day (QID) | RECTAL | Status: DC | PRN
  Filled 2021-07-31: qty 1

## 2021-07-31 MED ORDER — ROSUVASTATIN CALCIUM 10 MG PO TABS: 40.0000 mg | ORAL_TABLET | Freq: Every day | ORAL | Status: DC

## 2021-07-31 MED ORDER — PREDNISONE 10 MG PO TABS: 5.0000 mg | ORAL_TABLET | Freq: Every day | ORAL | Status: DC

## 2021-07-31 MED ORDER — LEVOTHYROXINE SODIUM 50 MCG PO TABS
50.0000 ug | ORAL_TABLET | Freq: Every day | ORAL | Status: DC
  Administered 2021-08-01 – 2021-08-03 (×3): 50 ug via ORAL
  Filled 2021-07-31 (×3): qty 1

## 2021-07-31 MED ORDER — SODIUM CHLORIDE 0.9 % IV BOLUS
1000.0000 mL | Freq: Once | INTRAVENOUS | Status: AC
  Administered 2021-07-31: 1000 mL via INTRAVENOUS

## 2021-07-31 MED ORDER — LOSARTAN POTASSIUM 25 MG PO TABS
25.0000 mg | ORAL_TABLET | Freq: Every day | ORAL | Status: DC
  Administered 2021-08-01: 25 mg via ORAL
  Filled 2021-07-31: qty 1

## 2021-07-31 MED ORDER — SODIUM CHLORIDE 0.9 % IV SOLN: INTRAVENOUS | Status: DC

## 2021-07-31 MED ORDER — ONDANSETRON HCL 4 MG/2ML IJ SOLN: 4.0000 mg | Freq: Four times a day (QID) | INTRAMUSCULAR | Status: DC | PRN

## 2021-07-31 MED ORDER — POLYVINYL ALCOHOL 1.4 % OP SOLN
1.0000 [drp] | Freq: Three times a day (TID) | OPHTHALMIC | Status: DC | PRN
  Filled 2021-07-31: qty 15

## 2021-07-31 MED ORDER — SODIUM CHLORIDE 0.9 % IV SOLN
1.0000 g | Freq: Once | INTRAVENOUS | Status: AC
  Administered 2021-08-01: 1 g via INTRAVENOUS
  Filled 2021-07-31: qty 10

## 2021-07-31 MED ORDER — FERROUS SULFATE 325 (65 FE) MG PO TABS
325.0000 mg | ORAL_TABLET | ORAL | Status: DC
  Administered 2021-08-02: 325 mg via ORAL
  Filled 2021-07-31: qty 1

## 2021-07-31 MED ORDER — APIXABAN 2.5 MG PO TABS
2.5000 mg | ORAL_TABLET | Freq: Two times a day (BID) | ORAL | Status: DC
  Administered 2021-08-01 – 2021-08-03 (×6): 2.5 mg via ORAL
  Filled 2021-07-31 (×6): qty 1

## 2021-07-31 NOTE — H&P (Signed)
History and Physical    Penny White:[id number] DOB: 01/13/1935 DOA: 07/31/2021  PCP: Leonel Ramsay, MD   Patient coming from: Assisted living  I have personally briefly reviewed patient's relevant medical records in [place]  Chief Complaint: Generalized weakness  HPI: Penny White is a 86 y.o. female with medical history significant for Polymyalgia rheumatica on chronic prednisone, hypothyroidism, lymphoma, CAD, recurrent TIA, HFpEF, HTN, A. fib on Eliquis, CKD 4 with anemia of CKD, who resides in assisted living who was sent in with a 2-week history of generalized weakness and fatigue and today was noted to have a low blood pressure with systolic in the 86V.  Both patient and her son at bedside contributes to the history.  At baseline patient is able to walk to the dining room but had to be taken in a wheelchair.  Patient also cites decreased appetite.  She denies chest pain, shortness of breath, nausea vomiting, abdominal pain or change in bowel habits or dysuria.  She denies headache or visual disturbance and denies one-sided weakness numbness or tingling.  Son states that patient was having progressively worsening lower extremity edema a few weeks prior and her doctor doubled the dose of torsemide with significant improvement in the swelling however patient has had progressive weakness.  Son denies knowledge of a history of CHF though this is documented in outside chart.  Documentation also shows that patient had an echo in May 2021 that showed normal left and right ventricular systolic function however unable to find echo in record.  ED course: On arrival, BP 93/66 with pulse of 57 and otherwise normal vitals Blood work with several abnormalities including troponin 144> 139, lactic acid 2.4 with normal WBC of 4100.  Creatinine 2.74 above baseline of 1.74.  Elevated AST/ALT of 191/133, up from normal baseline within the past few months, hemoglobin 8.9 which is close to  baseline 9-10 Urinalysis showed small leukocyte esterase and large hemoglobin  EKG, personally reviewed and interpreted: Sinus at 59 with poor R wave progression  Imaging: Chest x-ray shows no active disease  Patient was started on IV hydration and hospitalist consulted for admission.    Review of Systems: As per HPI otherwise all other systems on review of systems negative.    Past Medical History:  Diagnosis Date   Actinic keratosis    Arthritis    Atherosclerosis    Atrial fibrillation (HCC)    CHF (congestive heart failure) ([place])    Coronary artery disease    Dementia (HCC)    Dyspnea    Encephalitis 2020   Gout    Hyperlipemia    Hypertension    Hypothyroid    Kidney disease, chronic, stage III (moderate, EGFR 30-59 ml/min) (HCC)    now stage 4   Shingles 2020   Small cell B-cell lymphoma, unspecified site (HCC)    Squamous cell carcinoma of skin    L arm removed by Dr Phillip Heal    Stroke [facility]) 2018   + 2021.  Peripheral vision damage.   Subdural hemorrhage ([place]) 2020    Past Surgical History:  Procedure Laterality Date   CARDIAC CATHETERIZATION     CATARACT EXTRACTION W/PHACO Left 12/04/2020   Procedure: CATARACT EXTRACTION PHACO AND INTRAOCULAR LENS PLACEMENT (IOC) LEFT 6.95 00:45.9;  Surgeon: Birder Robson, MD;  Location: [place];  Service: Ophthalmology;  Laterality: Left;   CATARACT EXTRACTION W/PHACO Right 12/18/2020   Procedure: CATARACT EXTRACTION PHACO AND INTRAOCULAR LENS PLACEMENT (IOC) RIGHT  7.07 00:42.9;  Surgeon: Birder Robson, MD;  Location: [place];  Service: Ophthalmology;  Laterality: Right;   CHOLECYSTECTOMY  1978   COLONOSCOPY     LYMPH NODE BIOPSY N/A 05/17/2021   Procedure: RIGHT OCCIPITAL LYMPH NODE BIOPSY;  Surgeon: Robert Bellow, MD;  Location: [facility];  Service: General;  Laterality: N/A;  occipital node biopsy possible axillary node biopsy   REPLACEMENT TOTAL KNEE Bilateral    Right 06/02/03,  left  04/20/06   TUBAL LIGATION       reports that she has never smoked. She has never used smokeless tobacco. She reports current alcohol use. She reports that she does not currently use drugs.  Allergies  Allergen Reactions   Ciprofloxacin Itching   Novocain [Procaine]     Doesn't remember reaction    History reviewed. No pertinent family history.    Prior to Admission medications   Medication Sig Start Date End Date Taking? Authorizing Provider  apixaban (ELIQUIS) 5 MG TABS tablet Take 1 tablet (5 mg total) by mouth 2 (two) times daily. 05/19/21  Yes Robert Bellow, MD  ferrous sulfate 325 (65 FE) MG tablet Take 325 mg by mouth every other day.   Yes [provider]  levothyroxine (SYNTHROID) 50 MCG tablet Take 50 mcg by mouth daily before breakfast.   Yes [provider]  losartan (COZAAR) 25 MG tablet Take 25 mg by mouth daily.   Yes [provider]  Multiple Vitamins-Iron (MULTIVITAMIN/IRON PO) Take 1 tablet by mouth daily.   Yes [provider]  predniSONE (DELTASONE) 5 MG tablet Take 5 mg by mouth daily with breakfast.   Yes [provider]  rosuvastatin (CRESTOR) 40 MG tablet Take 40 mg by mouth daily.   Yes [provider]  torsemide (DEMADEX) 10 MG tablet Take 20 mg by mouth daily.   Yes [provider]  acetaminophen (TYLENOL) 500 MG tablet Take 1 tablet (500 mg total) by mouth every 4 (four) hours as needed for moderate pain. 05/17/21   Robert Bellow, MD  carboxymethylcellulose (REFRESH PLUS) 0.5 % SOLN Place 1 drop into both eyes 3 (three) times daily as needed. 09/08/19   [provider]  lidocaine (LIDODERM) 5 % Place 1 patch onto the skin daily. Remove & Discard patch within 12 hours or as directed by MD Patient not taking: Reported on 07/31/2021    [provider]  mupirocin ointment (BACTROBAN) 2 % Apply to wounds QD until healed. Patient not taking: Reported on 07/31/2021 07/30/21    Ralene Bathe, MD  senna (SENOKOT) 8.6 MG tablet Take 1 tablet by mouth 2 (two) times daily.    [provider]    Physical Exam: Vitals:   07/31/21 2215 07/31/21 2230 07/31/21 2245 07/31/21 2312  BP: (!) 116/44 (!) 113/48 (!) 113/44 (!) 123/46  Pulse: (!) 54 (!) 54 (!) 52 (!) 52  Resp: 19 18 16 18   Temp: 98 F (36.7 C)   (!) 97.5 F (36.4 C)  TempSrc: Oral   Oral  SpO2: 93% 98% 99% 100%  Weight:      Height:       Constitutional: Alert and oriented x 3 . Not in any apparent distress HEENT:      Head: Normocephalic and atraumatic.         Eyes: PERLA, EOMI, Conjunctivae are normal. Sclera is non-icteric.       Mouth/Throat: Mucous membranes are moist.       Neck:  Supple with no signs of meningismus. Cardiovascular: Regular rate and rhythm. No murmurs, gallops, or rubs. 2+ symmetrical distal pulses are present . No JVD. No  LE edema Respiratory: Respiratory effort normal .Lungs sounds clear bilaterally. No wheezes, crackles, or rhonchi.  Gastrointestinal: Soft, non tender, non distended. Positive bowel sounds.  Genitourinary: No CVA tenderness. Musculoskeletal: Nontender with normal range of motion in all extremities. No cyanosis, or erythema of extremities. Neurologic:  Face is symmetric. Moving all extremities. No gross focal neurologic deficits . Skin: Skin is warm, dry.  No rash or ulcers Psychiatric: Mood and affect are appropriate    Labs on Admission: I have personally reviewed following labs and imaging studies  CBC: Recent Labs  Lab 07/31/21 1713  WBC 4.1  NEUTROABS 3.2  HGB 8.9*  HCT 29.4*  MCV 90.5  PLT 332   Basic Metabolic Panel: Recent Labs  Lab 07/31/21 1713  NA 136  K 3.6  CL 96*  CO2 26  GLUCOSE 114*  BUN 47*  CREATININE 2.74*  CALCIUM 8.5*   GFR: Estimated Creatinine Clearance: 14.6 mL/min (A) (by C-G formula based on SCr of 2.74 mg/dL (H)). Liver Function Tests: Recent Labs  Lab 07/31/21 1713  AST 191*  ALT 133*   ALKPHOS 63  BILITOT 0.8  PROT 5.7*  ALBUMIN 2.4*   No results for input(s): LIPASE, AMYLASE in the last 168 hours. No results for input(s): AMMONIA in the last 168 hours. Coagulation Profile: No results for input(s): INR, PROTIME in the last 168 hours. Cardiac Enzymes: No results for input(s): CKTOTAL, CKMB, CKMBINDEX, TROPONINI in the last 168 hours. BNP (last 3 results) No results for input(s): PROBNP in the last 8760 hours. HbA1C: No results for input(s): HGBA1C in the last 72 hours. CBG: No results for input(s): GLUCAP in the last 168 hours. Lipid Profile: No results for input(s): CHOL, HDL, LDLCALC, TRIG, CHOLHDL, LDLDIRECT in the last 72 hours. Thyroid Function Tests: Recent Labs    07/31/21 2004  TSH 1.066   Anemia Panel: No results for input(s): VITAMINB12, FOLATE, FERRITIN, TIBC, IRON, RETICCTPCT in the last 72 hours. Urine analysis:    Component Value Date/Time   COLORURINE STRAW (A) 07/31/2021 1652   APPEARANCEUR CLEAR (A) 07/31/2021 1652   APPEARANCEUR Hazy (A) 04/26/2021 1117   LABSPEC 1.006 07/31/2021 1652   LABSPEC 1.019 05/16/2013 1324   PHURINE 6.0 07/31/2021 1652   GLUCOSEU NEGATIVE 07/31/2021 1652   GLUCOSEU Negative 05/16/2013 1324   HGBUR LARGE (A) 07/31/2021 1652   BILIRUBINUR NEGATIVE 07/31/2021 1652   BILIRUBINUR Negative 04/26/2021 1117   BILIRUBINUR Negative 05/16/2013 1324   [place] 07/31/2021 1652   PROTEINUR 30 (A) 07/31/2021 1652   NITRITE NEGATIVE 07/31/2021 1652   LEUKOCYTESUR SMALL (A) 07/31/2021 1652   LEUKOCYTESUR Negative 05/16/2013 1324    Radiological Exams on Admission: DG Chest Portable 1 View  Result Date: 07/31/2021 CLINICAL DATA:  Weakness over the last 2 days.  Question pneumonia. EXAM: PORTABLE CHEST 1 VIEW COMPARISON:  None. FINDINGS: Heart size is normal. Tortuosity of the aorta. The lungs are clear. Evidence of infiltrate, collapse or effusion. No sign of heart failure. No acute bone finding. IMPRESSION:  Tortuosity of the aorta. No active disease evident. No sign of pneumonia. Electronically Signed   By: Nelson Chimes M.D.   On: 07/31/2021 17:00    Assessment/Plan    Generalized weakness -Multifactorial, and related to suspected dehydration and hypotension from recent diuretic treatment for treatment of leg edema, elevated troponin with possibiity  of ACS, as well as physical deconditioning - Treat acute problems as will be outlined - Physical therapy evaluation    Acute kidney injury superimposed on CKD  -Creatinine 2.14, up from a recent enough baseline of 1.74 - Suspect related to dehydration from increased diuretic for treatment of lower extremity edema - IV hydration, - Monitor renal function and avoid nephrotoxins     Lactic acidosis - Lactic acid of 2.4 without other compelling sepsis criteria - Suspect related to dehydration - IV hydration and monitor - Follow-up procalcitonin - No antibiotics for now    Elevated troponin History of CAD - Troponin elevated but with non-ACS trend 144> 139 - Patient denies chest pain and EKG is nonacute - Remote documented history of CAD and CHF though both son and patient unaware - We will get cardiology consult - Suspecting demand ischemia not ACS - We will opt to not place patient on heparin at this time - Echocardiogram to evaluate for focal wall motion abnormalitie  Hypotension Essential hypertension - Patient sent from ALF with hypotension systolic in the 50T - Hold all home antihypertensives - Suspecting related to dehydration - Appears to be fluid responsive     Chronic atrial fibrillation with slow ventricular response   Chronic anticoagulation - Patient with slow ventricular response in the 50s sometimes to 40s - Not currently on rate control agents - Continue to monitor    Elevated LFTs - Upward trend in LFTs noted for past month - Patient on high doses of rosuvastatin up to 40 mg - Suspected medication related -  Repeat CMP ordered for in the a.m. - Hold statins  Anemia of CKD - Hemoglobin 8.9 with baseline 9-10 - No evidence of bleeding - Continue to monitor  Possible UTI - Urinalysis minimally abnormal and with mild hematuria - Patient was given a dose of Rocephin in the ED - Follow cultures - Antibiotics were not continued pending results of culture     Polymyalgia rheumatica ([place])   Current chronic use of systemic steroids - Continue daily prednisone - Patient denying pain    Heart failure with preserved left ventricular function (HFpEF) ([place]) - Patient is on torsemide and family denying history of CHF - Recently had worsening of lower extremity edema and torsemide dose was doubled - We will get echocardiogram in the a.m.  History of CVA - Patient is without focal deficits - Continue Eliquis - Holding statins    Small cell B-cell lymphoma, unspecified site -followed by oncology    Hypothyroid - Check TSH - Continue levothyroxine    DVT prophylaxis: Eliquis Code Status: DNR Family Communication: Son at bedside.  Plan of care discussed with son who verbalized understanding and agreement.  Opportunity for questions given. we also discussed DNR.  His mother is DNR Disposition Plan: Back to previous home environment Consults called: Cardiology and nephrology Status:At the time of admission, it appears that the appropriate admission status for this patient is INPATIENT. This is judged to be reasonable and necessary in order to provide the required intensity of service to ensure the patient's safety given the presenting symptoms, physical exam findings, and initial radiographic and laboratory data in the context of their  Comorbid conditions.   Patient requires inpatient status due to high intensity of service, high risk for further deterioration and high frequency of surveillance required.   I certify that at the point of admission it is my clinical judgment that the patient will  require inpatient hospital care spanning beyond  Mutual MD [facility]   07/31/2021, 11:44 PM

## 2021-07-31 NOTE — ED Triage Notes (Signed)
Pt comes from [place] via [facility]. Facility reports weakness that started 2 days ago. Facility called EMS because systolic bp was in 51M. A&O x4. Hx of surgery on left arm, hx of afib

## 2021-07-31 NOTE — ED Notes (Signed)
Lab at bedside

## 2021-07-31 NOTE — ED Notes (Signed)
Clarise Cruz RN aware of assigned bed

## 2021-07-31 NOTE — Progress Notes (Signed)
ANTICOAGULATION CONSULT NOTE - Initial Consult  Pharmacy Consult for Eliquis Indication: Afib   Allergies  Allergen Reactions   Ciprofloxacin Itching   Novocain [Procaine]     Doesn't remember reaction    Patient Measurements: Height: 5' 2"  (157.5 cm) Weight: 75.8 kg (167 lb) IBW/kg (Calculated) : 50.1 Heparin Dosing Weight:   Vital Signs: Temp: 97.5 F (36.4 C) (11/09 2312) Temp Source: Oral (11/09 2215) BP: 123/46 (11/09 2312) Pulse Rate: 52 (11/09 2312)  Labs: Recent Labs    07/31/21 1713 07/31/21 2004  HGB 8.9*  --   HCT 29.4*  --   PLT 192  --   CREATININE 2.74*  --   TROPONINIHS 144* 139*    Estimated Creatinine Clearance: 14.6 mL/min (A) (by C-G formula based on SCr of 2.74 mg/dL (H)).   Medical History: Past Medical History:  Diagnosis Date   Actinic keratosis    Arthritis    Atherosclerosis    Atrial fibrillation (HCC)    CHF (congestive heart failure) ([place])    Coronary artery disease    Dementia (HCC)    Dyspnea    Encephalitis 2020   Gout    Hyperlipemia    Hypertension    Hypothyroid    Kidney disease, chronic, stage III (moderate, EGFR 30-59 ml/min) (HCC)    now stage 4   Shingles 2020   Small cell B-cell lymphoma, unspecified site (HCC)    Squamous cell carcinoma of skin    L arm removed by Dr Phillip Heal    Stroke [facility]) 2018   + 2021.  Peripheral vision damage.   Subdural hemorrhage ([place]) 2020    Medications:    Assessment: Pt on Eliquis 5 mg PO BID PTA.   Pt is 86 yo and SrCr = 2.74.  Based on manufacturers recommendation dose should be 2.5 mg PO BID.   Goal of Therapy:  Prevention of thromboembolism.     Plan:  Will adjust dose to Eliquis 2.5 mg PO BID to start 11/9 @ 2300.  MD is in agreement.   Dailan Pfalzgraf D 07/31/2021,11:30 PM

## 2021-07-31 NOTE — ED Provider Notes (Signed)
[facility]  ____________________________________________   Event Date/Time   First MD Initiated Contact with Patient 07/31/21 1603     (approximate)  I have reviewed the triage vital signs and the nursing notes.   HISTORY  Chief Complaint Weakness    HPI Penny White is a 86 y.o. female with past medical history of atrial fibrillation on Eliquis, dementia, CAD, diastolic heart failure hypothyroidism who presents with generalized weakness and low blood pressure.  Patient notes that over the last several days she has just felt extremely fatigued and weak.  Today she was barely able to walk to the dining hall.  They checked her blood pressure at the facility and it was in the 36U systolic, normally she is in the 120s.  She denies fevers, chills chest pain dyspnea.  Denies abdominal pain nausea vomiting diarrhea dysuria or decreased urination.  Patient's son notes that she really does not drink much at all and that recently her diuretic torsemide was increased from 10 to 20 mg because she had significant lower extremity edema.  The swelling in her legs is significant improved since then.         Past Medical History:  Diagnosis Date   Actinic keratosis    Arthritis    Atherosclerosis    Atrial fibrillation (HCC)    CHF (congestive heart failure) ([place])    Coronary artery disease    Dementia (HCC)    Dyspnea    Encephalitis 2020   Gout    Hyperlipemia    Hypertension    Hypothyroid    Kidney disease, chronic, stage III (moderate, EGFR 30-59 ml/min) (HCC)    now stage 4   Shingles 2020   Small cell B-cell lymphoma, unspecified site (HCC)    Squamous cell carcinoma of skin    L arm removed by Dr Phillip Heal    Stroke [facility]) 2018   + 2021.  Peripheral vision damage.   Subdural hemorrhage ([place]) 2020    Patient Active Problem List   Diagnosis Date Noted   Elevated troponin 07/31/2021   Essential hypertension 07/31/2021   Chronic anticoagulation  07/31/2021   Acute kidney injury superimposed on CKD IV ([place]) 07/31/2021   Elevated LFTs 07/31/2021   Generalized weakness 07/31/2021   Polymyalgia rheumatica ([place]) 07/31/2021   Coronary artery disease    Small cell B-cell lymphoma, unspecified site [facility])    Hypothyroid    Current chronic use of systemic steroids    Elevated lactic acid level    UTI (urinary tract infection)    Bradycardia    Lactic acidosis    Anemia in chronic kidney disease 07/16/2020   Chronic kidney disease, stage IV (severe) ([place]) 06/18/2020   Chronic atrial fibrillation ([place]) 01/22/2017   Personal history of transient ischemic attack (TIA), and cerebral infarction without residual deficits 09/27/2016   Heart failure with preserved left ventricular function (HFpEF) ([place]) 06/25/2015   MGUS (monoclonal gammopathy of unknown significance) 05/07/2012    Past Surgical History:  Procedure Laterality Date   CARDIAC CATHETERIZATION     CATARACT EXTRACTION W/PHACO Left 12/04/2020   Procedure: CATARACT EXTRACTION PHACO AND INTRAOCULAR LENS PLACEMENT (IOC) LEFT 6.95 00:45.9;  Surgeon: Birder Robson, MD;  Location: [place];  Service: Ophthalmology;  Laterality: Left;   CATARACT EXTRACTION W/PHACO Right 12/18/2020   Procedure: CATARACT EXTRACTION PHACO AND INTRAOCULAR LENS PLACEMENT (IOC) RIGHT 7.07 00:42.9;  Surgeon: Birder Robson, MD;  Location: [place];  Service: Ophthalmology;  Laterality: Right;  CHOLECYSTECTOMY  1978   COLONOSCOPY     LYMPH NODE BIOPSY N/A 05/17/2021   Procedure: RIGHT OCCIPITAL LYMPH NODE BIOPSY;  Surgeon: Robert Bellow, MD;  Location: [facility];  Service: General;  Laterality: N/A;  occipital node biopsy possible axillary node biopsy   REPLACEMENT TOTAL KNEE Bilateral    Right 06/02/03,  left 04/20/06   TUBAL LIGATION      Prior to Admission medications   Medication Sig Start Date End Date Taking? Authorizing Provider  apixaban (ELIQUIS) 5 MG TABS tablet Take  1 tablet (5 mg total) by mouth 2 (two) times daily. 05/19/21  Yes Robert Bellow, MD  ferrous sulfate 325 (65 FE) MG tablet Take 325 mg by mouth every other day.   Yes [provider]  levothyroxine (SYNTHROID) 50 MCG tablet Take 50 mcg by mouth daily before breakfast.   Yes [provider]  losartan (COZAAR) 25 MG tablet Take 25 mg by mouth daily.   Yes [provider]  Multiple Vitamins-Iron (MULTIVITAMIN/IRON PO) Take 1 tablet by mouth daily.   Yes [provider]  predniSONE (DELTASONE) 5 MG tablet Take 5 mg by mouth daily with breakfast.   Yes [provider]  rosuvastatin (CRESTOR) 40 MG tablet Take 40 mg by mouth daily.   Yes [provider]  torsemide (DEMADEX) 10 MG tablet Take 20 mg by mouth daily.   Yes [provider]  acetaminophen (TYLENOL) 500 MG tablet Take 1 tablet (500 mg total) by mouth every 4 (four) hours as needed for moderate pain. 05/17/21   Robert Bellow, MD  carboxymethylcellulose (REFRESH PLUS) 0.5 % SOLN Place 1 drop into both eyes 3 (three) times daily as needed. 09/08/19   [provider]  lidocaine (LIDODERM) 5 % Place 1 patch onto the skin daily. Remove & Discard patch within 12 hours or as directed by MD Patient not taking: Reported on 07/31/2021    [provider]  mupirocin ointment (BACTROBAN) 2 % Apply to wounds QD until healed. Patient not taking: Reported on 07/31/2021 07/30/21   Ralene Bathe, MD  senna (SENOKOT) 8.6 MG tablet Take 1 tablet by mouth 2 (two) times daily.    [provider]    Allergies Ciprofloxacin and Novocain [procaine]  No family history on file.  Social History Social History   Tobacco Use   Smoking status: Never   Smokeless tobacco: Never  Vaping Use   Vaping Use: Never used  Substance Use Topics   Alcohol use: Yes    Comment: occasional - wine small amount   Drug use: Not Currently    Review of Systems   Review of Systems   Constitutional:  Positive for activity change and fatigue. Negative for appetite change and fever.  Respiratory:  Negative for chest tightness and shortness of breath.   Cardiovascular:  Negative for chest pain.  Gastrointestinal:  Negative for abdominal pain, nausea and vomiting.  Genitourinary:  Negative for dysuria.  Neurological:  Negative for light-headedness and headaches.  All other systems reviewed and are negative.  Physical Exam Updated Vital Signs BP (!) 123/46 (BP Location: Right Arm)   Pulse (!) 52   Temp (!) 97.5 F (36.4 C) (Oral)   Resp 18   Ht 5' 2" (1.575 m)   Wt 75.8 kg   SpO2 100%   BMI 30.54 kg/m   Physical Exam Vitals and nursing note reviewed.  Constitutional:      General: She is not in acute  distress.    Appearance: Normal appearance.  HENT:     Head: Normocephalic and atraumatic.     Mouth/Throat:     Mouth: Mucous membranes are dry.  Eyes:     General: No scleral icterus.    Conjunctiva/sclera: Conjunctivae normal.  Pulmonary:     Effort: Pulmonary effort is normal. No respiratory distress.     Breath sounds: No stridor.  Abdominal:     General: Abdomen is flat. There is no distension.     Palpations: Abdomen is soft.     Tenderness: There is no abdominal tenderness. There is no guarding.  Musculoskeletal:        General: No deformity or signs of injury.     Cervical back: Normal range of motion.  Skin:    General: Skin is dry.     Coloration: Skin is not jaundiced or pale.  Neurological:     General: No focal deficit present.     Mental Status: She is alert and oriented to person, place, and time. Mental status is at baseline.  Psychiatric:        Mood and Affect: Mood normal.        Behavior: Behavior normal.     LABS (all labs ordered are listed, but only abnormal results are displayed)  Labs Reviewed  COMPREHENSIVE METABOLIC PANEL - Abnormal; Notable for the following components:      Result Value   Chloride 96 (*)     Glucose, Bld 114 (*)    BUN 47 (*)    Creatinine, Ser 2.74 (*)    Calcium 8.5 (*)    Total Protein 5.7 (*)    Albumin 2.4 (*)    AST 191 (*)    ALT 133 (*)    GFR, Estimated 17 (*)    All other components within normal limits  CBC WITH DIFFERENTIAL/PLATELET - Abnormal; Notable for the following components:   RBC 3.25 (*)    Hemoglobin 8.9 (*)    HCT 29.4 (*)    RDW 16.2 (*)    Lymphs Abs 0.3 (*)    All other components within normal limits  LACTIC ACID, PLASMA - Abnormal; Notable for the following components:   Lactic Acid, Venous 2.4 (*)    All other components within normal limits  URINALYSIS, COMPLETE (UACMP) WITH MICROSCOPIC - Abnormal; Notable for the following components:   Color, Urine STRAW (*)    APPearance CLEAR (*)    Hgb urine dipstick LARGE (*)    Protein, ur 30 (*)    Leukocytes,Ua SMALL (*)    Bacteria, UA RARE (*)    All other components within normal limits  BRAIN NATRIURETIC PEPTIDE - Abnormal; Notable for the following components:   B Natriuretic Peptide 274.2 (*)    All other components within normal limits  TROPONIN I (HIGH SENSITIVITY) - Abnormal; Notable for the following components:   Troponin I (High Sensitivity) 144 (*)    All other components within normal limits  TROPONIN I (HIGH SENSITIVITY) - Abnormal; Notable for the following components:   Troponin I (High Sensitivity) 139 (*)    All other components within normal limits  RESP PANEL BY RT-PCR (FLU A&B, COVID) ARPGX2  CULTURE, BLOOD (ROUTINE X 2)  CULTURE, BLOOD (ROUTINE X 2)  MRSA NEXT GEN BY PCR, NASAL  TSH  LACTIC ACID, PLASMA  COMPREHENSIVE METABOLIC PANEL  CBC  TSH  PROCALCITONIN  PROCALCITONIN   ____________________________________________  EKG  Narrow complex, regular rhythm, slow atrial flutter,  no acute ischemic changes __________________________________  RADIOLOGY Almeta Monas, personally viewed and evaluated these images (plain radiographs) as part of my medical  decision making, as well as reviewing the written report by the radiologist.  ED MD interpretation:  I reviewed the CXR which does not show any acute cardiopulmonary process      ____________________________________________   PROCEDURES  Procedure(s) performed (including Critical Care):  Procedures   ____________________________________________   INITIAL IMPRESSION / ASSESSMENT AND PLAN / ED COURSE     86 year old female presents with generalized weakness and hypotension from her facility.  Blood pressures in the 90s over 60s here, she is actually bradycardic satting about 90% on room air.  Overall she appears somewhat tired but is nontoxic.  Does have dry mucous membranes.  Abdomen is benign, lungs are clear she does not have any peripheral edema.  No signs of skin or soft tissue infection.  Will work-up broadly for sepsis.  I do suspect that she is dry from the increase in her diuresis so we will give her a liter of fluid.  This may be why she is hypotensive but will rule out other etiologies as well.  Work is notable for a lactate of 2.4 and a troponin elevated to 144.  She has no leukocytosis, hemoglobin is low at 8.9 but this is within her normal range.  AST and ALT elevated although this looks chronic as well, but T bili is normal.  Creatinine is mildly increased at 2.7 from baseline around 2.  We will repeat her lactate after fluids.   Patient's UA is really inconclusive for UTI.  She did have some ongoing hypotension so was given another 500 cc of fluid which she responded to.  We will send blood cultures and a urine culture.  I am not convinced that she is septic but with the inconclusive UTI and her hypotension will treat with Rocephin.  She will be admitted to the hospitalist service.   ____________________________________________   FINAL CLINICAL IMPRESSION(S) / ED DIAGNOSES  Final diagnoses:  Hypotension, unspecified hypotension type     ED Discharge Orders      None        Note:  This document was prepared using Dragon voice recognition software and may include unintentional dictation errors.    Rada Hay, MD 08/01/21 Dyann Kief

## 2021-07-31 NOTE — ED Notes (Signed)
Lab called to draw labs on pt

## 2021-08-01 ENCOUNTER — Encounter: Payer: Self-pay | Admitting: Internal Medicine

## 2021-08-01 ENCOUNTER — Inpatient Hospital Stay (HOSPITAL_COMMUNITY)
Admit: 2021-08-01 | Discharge: 2021-08-01 | Disposition: A | Discharge status: Home / Self Care | Payer: Medicare HMO | Attending: Internal Medicine | Admitting: Internal Medicine

## 2021-08-01 DIAGNOSIS — E860 Dehydration: Principal | ICD-10-CM

## 2021-08-01 DIAGNOSIS — N182 Chronic kidney disease, stage 2 (mild): Secondary | ICD-10-CM

## 2021-08-01 DIAGNOSIS — I5030 Unspecified diastolic (congestive) heart failure: Secondary | ICD-10-CM | POA: Diagnosis not present

## 2021-08-01 DIAGNOSIS — N189 Chronic kidney disease, unspecified: Secondary | ICD-10-CM

## 2021-08-01 DIAGNOSIS — D631 Anemia in chronic kidney disease: Secondary | ICD-10-CM

## 2021-08-01 DIAGNOSIS — Z7952 Long term (current) use of systemic steroids: Secondary | ICD-10-CM

## 2021-08-01 DIAGNOSIS — I25118 Atherosclerotic heart disease of native coronary artery with other forms of angina pectoris: Secondary | ICD-10-CM

## 2021-08-01 DIAGNOSIS — E038 Other specified hypothyroidism: Secondary | ICD-10-CM

## 2021-08-01 DIAGNOSIS — N1832 Chronic kidney disease, stage 3b: Secondary | ICD-10-CM

## 2021-08-01 DIAGNOSIS — I4819 Other persistent atrial fibrillation: Secondary | ICD-10-CM

## 2021-08-01 DIAGNOSIS — R778 Other specified abnormalities of plasma proteins: Secondary | ICD-10-CM

## 2021-08-01 DIAGNOSIS — N179 Acute kidney failure, unspecified: Secondary | ICD-10-CM

## 2021-08-01 DIAGNOSIS — M353 Polymyalgia rheumatica: Secondary | ICD-10-CM

## 2021-08-01 DIAGNOSIS — R7989 Other specified abnormal findings of blood chemistry: Secondary | ICD-10-CM

## 2021-08-01 DIAGNOSIS — R001 Bradycardia, unspecified: Secondary | ICD-10-CM

## 2021-08-01 DIAGNOSIS — I482 Chronic atrial fibrillation, unspecified: Secondary | ICD-10-CM

## 2021-08-01 DIAGNOSIS — I2510 Atherosclerotic heart disease of native coronary artery without angina pectoris: Secondary | ICD-10-CM

## 2021-08-01 DIAGNOSIS — I959 Hypotension, unspecified: Secondary | ICD-10-CM

## 2021-08-01 DIAGNOSIS — C8300 Small cell B-cell lymphoma, unspecified site: Secondary | ICD-10-CM

## 2021-08-01 DIAGNOSIS — I10 Essential (primary) hypertension: Secondary | ICD-10-CM

## 2021-08-01 LAB — RETICULOCYTES
Immature Retic Fract: 16.2 % — ABNORMAL HIGH (ref 2.3–15.9)
RBC.: 3.02 MIL/uL — ABNORMAL LOW (ref 3.87–5.11)
Retic Count, Absolute: 57.1 10*3/uL (ref 19.0–186.0)
Retic Ct Pct: 1.9 % (ref 0.4–3.1)

## 2021-08-01 LAB — CBC
HCT: 26.6 % — ABNORMAL LOW (ref 36.0–46.0)
Hemoglobin: 7.9 g/dL — ABNORMAL LOW (ref 12.0–15.0)
MCH: 26.7 pg (ref 26.0–34.0)
MCHC: 29.7 g/dL — ABNORMAL LOW (ref 30.0–36.0)
MCV: 89.9 fL (ref 80.0–100.0)
Platelets: 164 10*3/uL (ref 150–400)
RBC: 2.96 MIL/uL — ABNORMAL LOW (ref 3.87–5.11)
RDW: 16.1 % — ABNORMAL HIGH (ref 11.5–15.5)
WBC: 3.1 10*3/uL — ABNORMAL LOW (ref 4.0–10.5)
nRBC: 0 % (ref 0.0–0.2)

## 2021-08-01 LAB — IRON AND TIBC
Iron: 35 ug/dL (ref 28–170)
Saturation Ratios: 14 % (ref 10.4–31.8)
TIBC: 255 ug/dL (ref 250–450)
UIBC: 220 ug/dL

## 2021-08-01 LAB — COMPREHENSIVE METABOLIC PANEL
ALT: 124 U/L — ABNORMAL HIGH (ref 0–44)
AST: 178 U/L — ABNORMAL HIGH (ref 15–41)
Albumin: 2.4 g/dL — ABNORMAL LOW (ref 3.5–5.0)
Alkaline Phosphatase: 57 U/L (ref 38–126)
Anion gap: 9 (ref 5–15)
BUN: 47 mg/dL — ABNORMAL HIGH (ref 8–23)
CO2: 25 mmol/L (ref 22–32)
Calcium: 7.5 mg/dL — ABNORMAL LOW (ref 8.9–10.3)
Chloride: 104 mmol/L (ref 98–111)
Creatinine, Ser: 2.43 mg/dL — ABNORMAL HIGH (ref 0.44–1.00)
GFR, Estimated: 19 mL/min — ABNORMAL LOW (ref >60)
Glucose, Bld: 86 mg/dL (ref 70–99)
Potassium: 2.5 mmol/L — CL (ref 3.5–5.1)
Sodium: 138 mmol/L (ref 135–145)
Total Bilirubin: 0.9 mg/dL (ref 0.3–1.2)
Total Protein: 5.2 g/dL — ABNORMAL LOW (ref 6.5–8.1)

## 2021-08-01 LAB — MRSA NEXT GEN BY PCR, NASAL: MRSA by PCR Next Gen: NOT DETECTED

## 2021-08-01 LAB — ECHOCARDIOGRAM COMPLETE
AR max vel: 2.16 cm2
AV Area VTI: 2.22 cm2
AV Area mean vel: 2.16 cm2
AV Mean grad: 8 mmHg
AV Peak grad: 13.5 mmHg
Ao pk vel: 1.84 m/s
Area-P 1/2: 3.56 cm2
Height: 62 in
MV VTI: 2.58 cm2
S' Lateral: 2.7 cm
Weight: 2571.45 oz

## 2021-08-01 LAB — MAGNESIUM: Magnesium: 2 mg/dL (ref 1.7–2.4)

## 2021-08-01 LAB — LACTIC ACID, PLASMA: Lactic Acid, Venous: 1.3 mmol/L (ref 0.5–1.9)

## 2021-08-01 LAB — FOLATE: Folate: 15.2 ng/mL (ref >5.9)

## 2021-08-01 LAB — TSH: TSH: 1.466 u[IU]/mL (ref 0.350–4.500)

## 2021-08-01 LAB — PROCALCITONIN
Procalcitonin: 0.16 ng/mL
Procalcitonin: 0.13 ng/mL

## 2021-08-01 LAB — CK: Total CK: 3525 U/L — ABNORMAL HIGH (ref 38–234)

## 2021-08-01 LAB — FERRITIN: Ferritin: 73 ng/mL (ref 11–307)

## 2021-08-01 LAB — VITAMIN B12: Vitamin B-12: 702 pg/mL (ref 180–914)

## 2021-08-01 MED ORDER — POTASSIUM CHLORIDE CRYS ER 20 MEQ PO TBCR
40.0000 meq | EXTENDED_RELEASE_TABLET | Freq: Two times a day (BID) | ORAL | Status: AC
  Administered 2021-08-01 (×2): 40 meq via ORAL
  Filled 2021-08-01 (×2): qty 2

## 2021-08-01 MED ORDER — POTASSIUM CHLORIDE CRYS ER 20 MEQ PO TBCR
40.0000 meq | EXTENDED_RELEASE_TABLET | Freq: Once | ORAL | Status: AC
  Administered 2021-08-01: 40 meq via ORAL
  Filled 2021-08-01: qty 2

## 2021-08-01 MED ORDER — POTASSIUM CHLORIDE 10 MEQ/100ML IV SOLN
10.0000 meq | INTRAVENOUS | Status: DC
  Administered 2021-08-01 (×2): 10 meq via INTRAVENOUS
  Filled 2021-08-01 (×6): qty 100

## 2021-08-01 MED ORDER — PREDNISONE 20 MG PO TABS
20.0000 mg | ORAL_TABLET | Freq: Every day | ORAL | Status: DC
  Administered 2021-08-01 – 2021-08-03 (×3): 20 mg via ORAL
  Filled 2021-08-01 (×3): qty 1

## 2021-08-01 MED ORDER — POTASSIUM CHLORIDE IN NACL 40-0.9 MEQ/L-% IV SOLN
INTRAVENOUS | Status: DC
  Filled 2021-08-01: qty 1000

## 2021-08-01 NOTE — Consult Note (Signed)
Cardiology Consult    Patient ID: ELLISSA AYO MRN: [id number], DOB/AGE: July 29, 1935   Admit date: 07/31/2021 Date of Consult: 08/01/2021  Primary Physician: Leonel Ramsay, MD Primary Cardiologist: Ida Rogue, MD - new. Prev followed @ [facility] in [place] but no f/u since moving to [place]. Requesting Provider: C. Ouida Sills, MD  Patient Profile    Penny White is a 86 y.o. female with a history of permanent A. fib, HFpEF, hypertension, hyperlipidemia, hypothyroidism, prior stroke, marginal zone lymphoma, subdural hematoma, polymyalgia rheumatica, MGUS, nonobstructive CAD, and stage IV chronic kidney disease, who is being seen today for the evaluation of elevated troponin at the request of Dr. Ouida Sills.  Past Medical History   Past Medical History:  Diagnosis Date   Actinic keratosis    Arthritis    Atherosclerosis    Chronic heart failure with preserved ejection fraction (HFpEF) ([place])    a. 01/2020 Echo: EF >55%, mild LVH, nl RV fxn, Mild AI/TR, triv MR/PR; b. 07/2021 Echo: EF 60-65%, no rwma, nl RV fxn. Mildly dil LA. Mild MR/AI. Asc Ao 64mm.   CKD (chronic kidney disease), stage IV (HCC)    Dementia (HCC)    Dyspnea    Encephalitis 2020   Gout    Hyperlipemia    Hypertension    Hypothyroid    Marginal zone lymphoma (HCC)    MGUS (monoclonal gammopathy of unknown significance)    Nonobstructive Coronary artery disease    a. 2016 MV: very mild ant ischemia; b. 2019 MV: worsening ant ischemia; c 2019 Cath: nonobs dzs-->Med rx.   Permanent atrial fibrillation (HCC)    a. CHA2DS2VASc = 7-->eliquis.   Polymyalgia rheumatica (HCC)    Shingles 2020   Small cell B-cell lymphoma, unspecified site ([place])    Squamous cell carcinoma of skin    L arm removed by Dr Phillip Heal    Stroke [facility]) 2018   + 2021.  Peripheral vision damage.   Subdural hemorrhage ([place]) 2020    Past Surgical History:  Procedure Laterality Date   CARDIAC CATHETERIZATION     CATARACT EXTRACTION  W/PHACO Left 12/04/2020   Procedure: CATARACT EXTRACTION PHACO AND INTRAOCULAR LENS PLACEMENT (IOC) LEFT 6.95 00:45.9;  Surgeon: Birder Robson, MD;  Location: [place];  Service: Ophthalmology;  Laterality: Left;   CATARACT EXTRACTION W/PHACO Right 12/18/2020   Procedure: CATARACT EXTRACTION PHACO AND INTRAOCULAR LENS PLACEMENT (IOC) RIGHT 7.07 00:42.9;  Surgeon: Birder Robson, MD;  Location: [place];  Service: Ophthalmology;  Laterality: Right;   CHOLECYSTECTOMY  1978   COLONOSCOPY     LYMPH NODE BIOPSY N/A 05/17/2021   Procedure: RIGHT OCCIPITAL LYMPH NODE BIOPSY;  Surgeon: Robert Bellow, MD;  Location: [facility];  Service: General;  Laterality: N/A;  occipital node biopsy possible axillary node biopsy   REPLACEMENT TOTAL KNEE Bilateral    Right 06/02/03,  left 04/20/06   TUBAL LIGATION       Allergies  Allergies  Allergen Reactions   Ciprofloxacin Itching   Novocain [Procaine]     Doesn't remember reaction    History of Present Illness    86 year old female with the above past medical history including permanent atrial fibrillation, HFpEF, hypertension, hyperlipidemia, hypothyroidism, prior stroke, marginal zone lymphoma, subdural hematoma, polymyalgia rheumatica, MGUS, nonobstructive CAD, and stage IV chronic kidney disease.  She notes a long history of atrial fibrillation and for many years was not anticoagulated.  She suffered a stroke in 2018 which was treated with tPA.  She was  subsequently placed on Eliquis and has been maintained on this since.  She was previously followed by cardiology in [place] but did not establish with cardiology once moving to [place].  Previous notes reviewed and she was noted to have anterior ischemia on a stress test in 2019 which led to diagnostic catheterization, showing nonobstructive CAD.  She has been medically managed.  Echocardiogram in May 2021, showed normal LV function without any significant valvular  disease.  Patient now lives locally and in an assisted living facility.  She ambulates with a walker given history of unsteady gait.  In the setting, she is not particularly active.  Greatest amount of activity is likely walking to and from the cafeteria.  She does not like the food at her assisted living facility.  Approximately 2 weeks ago, she started noticing increasing lower extremity edema, dyspnea on exertion, and progressive leg weakness when walking.  She was seen by her nephrologist and her torsemide dose was doubled from 10 mg daily to 20 mg daily.  Labs that day showed stable renal function with a creatinine of 2.03.  LFTs were mildly elevated with an AST of 87 and ALT of 66, though these are only slightly higher than September numbers.  Blood counts were stable.  With additional torsemide, she did note improvement in lower extremity swelling however, weakness with ambulation persisted.  She is not sure if dyspnea improved or not, only that she was getting weaker.  She denies experiencing chest pain or palpitations, prompting presentation to the emergency department.  On November 9, in the setting of weakness and fatigue, she was also noted to be relatively hypotensive with systolics in the 13K, poor here, blood pressure was 90/66 with a heart rate of 57 and atrial fibrillation (known to be permanent).  Troponin was mildly elevated at 144  139.  Creatinine was elevated above baseline at 2.74.  AST and ALT had risen to 191/133.  Hemoglobin was relatively at baseline.  ECG showed atrial fibrillation at 59 bpm without acute ST or T changes.  Lactic acid was 2.4.  Overall clinical picture was felt to be consistent with dehydration and she was placed on IV fluids.  Torsemide was held.  Since admission, she has been found to have hypokalemia with a potassium of 2.5 this morning.  She had been receiving oral as well as IV supplementation though her IV just infiltrated and she is having discomfort at the IV  site at this time.  She has not been out of bed so cannot really say if she feels any stronger.  Creatinine slightly better this morning at 2.43.  LFTs down slightly (AST 178, ALT 124).  Echo performed this morning shows normal LV function without any significant valvular disease.  Patient without any complaints at this time.  Inpatient Medications     apixaban  2.5 mg Oral BID   [START ON 08/02/2021] ferrous sulfate  325 mg Oral QODAY   levothyroxine  50 mcg Oral Q0600   potassium chloride  40 mEq Oral BID   predniSONE  20 mg Oral Q breakfast    Family History    History reviewed. No pertinent family history. She indicated that her mother is deceased. She indicated that her father is deceased.  Social History    Social History   Socioeconomic History   Marital status: Widowed    Spouse name: Not on file   Number of children: Not on file   Years of education: Not on file  Highest education level: Not on file  Occupational History   Not on file  Tobacco Use   Smoking status: Never   Smokeless tobacco: Never  Vaping Use   Vaping Use: Never used  Substance and Sexual Activity   Alcohol use: Yes    Comment: occasional - wine small amount   Drug use: Not Currently   Sexual activity: Not Currently  Other Topics Concern   Not on file  Social History Narrative   Homeplace in [place]   Social Determinants of Health   Financial Resource Strain: Not on file  Food Insecurity: Not on file  Transportation Needs: Not on file  Physical Activity: Not on file  Stress: Not on file  Social Connections: Not on file  Intimate Partner Violence: Not on file     Review of Systems    General:  +++ Progressive weakness over the past 1 to 2 weeks.  No chills, fever, night sweats or weight changes.  Cardiovascular:  No chest pain, +++ dyspnea on exertion, +++ edema-resolved, no orthopnea, palpitations, paroxysmal nocturnal dyspnea. Dermatological: No rash,  lesions/masses Respiratory: No cough, +++ dyspnea Urologic: No hematuria, dysuria Abdominal:   No nausea, vomiting, diarrhea, bright red blood per rectum, melena, or hematemesis Neurologic:  No visual changes, +++ generalized wkns, no changes in mental status. All other systems reviewed and are otherwise negative except as noted above.  Physical Exam    Blood pressure (!) 95/49, pulse (!) 58, temperature 98 F (36.7 C), resp. rate 19, height 5\' 2"  (1.575 m), weight 72.9 kg, SpO2 97 %.  General: Pleasant, NAD Psych: Normal affect. Neuro: Alert and oriented X 3. Moves all extremities spontaneously. HEENT: Normal  Neck: Supple without bruits or JVD. Lungs:  Resp regular and unlabored, CTA. Heart: Irregularly irregular, no s3, s4, or murmurs. Abdomen: Soft, non-tender, non-distended, BS + x 4.  Extremities: No clubbing, cyanosis or edema. DP/PT2+, Radials 2+ and equal bilaterally.  Labs    Cardiac Enzymes Recent Labs  Lab 07/31/21 1713 07/31/21 2004  TROPONINIHS 144* 139*      Lab Results  Component Value Date   WBC 3.1 (L) 08/01/2021   HGB 7.9 (L) 08/01/2021   HCT 26.6 (L) 08/01/2021   MCV 89.9 08/01/2021   PLT 164 08/01/2021    Recent Labs  Lab 08/01/21 0350  NA 138  K 2.5*  CL 104  CO2 25  BUN 47*  CREATININE 2.43*  CALCIUM 7.5*  PROT 5.2*  BILITOT 0.9  ALKPHOS 57  ALT 124*  AST 178*  GLUCOSE 86     Radiology Studies    DG Chest Portable 1 View  Result Date: 07/31/2021 CLINICAL DATA:  Weakness over the last 2 days.  Question pneumonia. EXAM: PORTABLE CHEST 1 VIEW COMPARISON:  None. FINDINGS: Heart size is normal. Tortuosity of the aorta. The lungs are clear. Evidence of infiltrate, collapse or effusion. No sign of heart failure. No acute bone finding. IMPRESSION: Tortuosity of the aorta. No active disease evident. No sign of pneumonia. Electronically Signed   By: Nelson Chimes M.D.   On: 07/31/2021 17:00   ECG & Cardiac Imaging    Afib, 59, leftward  axis, abnl R progression - personally reviewed.  Assessment & Plan    1.  Weakness/dehydration: Patient presented with 1 to 2-week history of progressive weakness and fatigue and was found to have elevated creatinine above baseline at 2.76.  Blood pressure also soft in the 90s.  Her torsemide dose was previously doubled  on November 1 in the setting of lower extremity edema, which patient notes resolved quickly.  Torsemide currently on hold and patient has received IV fluids.  Review of historical weights shows that current weight of 72.9 kg is roughly 3 kg below previous dry weight from the Summer.  Continue IV fluid hydration per medicine.    2.  Chronic heart failure with preserved ejection fraction: Patient with recent lower extremity swelling prompting increase of torsemide dose at home.  She noted significant improvement in swelling with the higher dose however, continued to have progressive weakness, eventually prompting presentation.  In the setting of above, torsemide currently on hold.  Heart rate stable while blood pressure remains somewhat soft.  In addition to holding torsemide, continue to hold home dose of losartan for the time being.  I suspect she will require torsemide 10 mg daily with an additional 10 mg daily as needed for weight gain of 2 to 3 pounds as she clearly did not tolerate 20 mg daily.   3.  Elevated troponin/demand ischemia/nonobstructive CAD: In the setting of #1, in commendation with acute kidney injury, troponin elevated at 144 with a follow-up value of 139.  Patient denies any chest pain.  She previously had an abnormal stress test in 2019 with catheterization revealing nonobstructive CAD.  Echocardiogram this admission again shows normal LV function without regional wall motion abnormalities.  This is reassuring.  We will defer any ischemic evaluation at this time.  No role for aspirin in the setting of chronic Eliquis.  No beta-blocker in the setting of relative  hypotension and bradycardia.  Home dose of statin on hold in setting of elevated LFTs.  4.  Permanent atrial fibrillation: This is well rate controlled in the absence of AV nodal blocking agents at home.  She remains anticoagulated with Eliquis with stable H&H.  5.  Acute on chronic stage IV kidney disease: Acute worsening in the setting of #1.  Torsemide and losartan on hold.  Creatinine and blood pressure permitting, would like to resume losartan prior to discharge, though may need to wait until the outpatient setting.  6.  Elevated LFTs: LFTs elevated dating back to at least September.  Crestor currently on hold.  Work-up per medicine.  7.  Hyperlipidemia: Statin on hold in the setting of #6.  8.  Hypokalemia: Potassium was 2.5 this morning.  Patient did not tolerate intravenous potassium due to discomfort and lower concentration bag infiltrated.  Oral potassium has been ordered.  Signed, Murray Hodgkins, NP 08/01/2021, 1:47 PM  For questions or updates, please contact   Please consult [email or web address] for contact info under Cardiology/STEMI.

## 2021-08-01 NOTE — Progress Notes (Addendum)
PROGRESS NOTE  [name]    DOB: Jan 21, 1935, 86 y.o.  MLY:[id number]  PCP: Penny Ramsay, MD   Code Status: DNR   DOA: 07/31/2021   LOS: 1  Brief Narrative of Current Hospitalization  Penny White is a 86 y.o. female with a PMH significant for polymyalgia rheumatica on chronic prednisone, hypothyroidism, history of lymphoma, CAD, TIA, HFpEF, HTN, A. fib on Eliquis, anemia of chronic disease, CKDIV. They presented from ALF to the ED on 07/31/2021 with generalized weakness, fatigue x 14 days. In the ED, it was found that they had several laboratory abnormalities as outlined below.. They were treated with IV fluid hydration, holding of home antihypertensives, Rocephin.  Cardiology was consulted.  Patient was admitted to medicine service for further workup and management of multiple comorbidities as outlined in detail below.  08/01/21 -stable, improved  Assessment & Plan  Principal Problem:   Generalized weakness Active Problems:   Elevated troponin   Chronic atrial fibrillation (HCC)   Essential hypertension   Heart failure with preserved left ventricular function (HFpEF) (HCC)   Personal history of transient ischemic attack (TIA), and cerebral infarction without residual deficits   Anemia in chronic kidney disease   Chronic anticoagulation   Acute kidney injury superimposed on CKD IV (HCC)   Elevated LFTs   Coronary artery disease   Small cell B-cell lymphoma, unspecified site (HCC)   Hypothyroid   Polymyalgia rheumatica (HCC)   Current chronic use of systemic steroids   Elevated lactic acid level   UTI (urinary tract infection)   Bradycardia   Lactic acidosis  Hypotension  hypovolemia-thought to be related to poor p.o. intake and increasing her diuretic medication.  Patient feels improved today. She is asymptomatic at rest. Blood pressures have remained soft despite holding her home antihypertensives. (Losartan was given this am). Monitor for improvement with  gentle hydration given h/o HF. If not improving significantly, she has several other confounding factors that can contribute to her presenting generalized fatigue as discussed below.  - orthostatic vitals - strict I/O - daily weights - continue PO hydration - cardiology has been consulted  AKI on CKD II  hypokalemia-Baseline creatinine around 1.7.  2.03>2.74 this admission. K+ 2.5. unable to tolerate IV -Continue PO hydration - hold losartan (given 11/10) - Kdur 76mEq x2, K+ additive to IV fluids - CMP am  CAD  HTN  Afib  HFpEF- chest pain free, rate controlled. Low/normal Bps. Euvolemic on exam but limited by body habitus.  - continue eliquis - holding torsemide for hypovolemia - f/u echo  Transaminitis- elevated AST/ALT. Potentially from hypovolemia - CMP am  Normocytic Anemia- hgb appears to be about baseline. 8.9 - continue to monitor - continue home iron  Polymyalgia rheumatica- presenting symptoms potentially related - increase home prednisone dose  CVA- without long term deficits - statin being held from admission for possible contribution to presenting symptoms - add on CK  Small cell B-cell lymphoma- recurrence found 04/2021. Last seen by oncology 11/1 and patient declined initiating treatment at that time so Dr Janese Banks recommended continued monitoring of her lymphoma with a repeat CBC with differential CMP LDH in 6 weeks and 12 weeks and I will see her back in 12 weeks. Potentially this is contributing to her weakened state.   Hypothyroid- TSH normal on admission. Not contributing to presentation - continue home levothyroxine  DVT prophylaxis: apixaban (ELIQUIS) tablet 2.5 mg Start: 08/01/21 0015 apixaban (ELIQUIS) tablet 2.5 mg   Diet:  Diet Orders (  From admission, onward)     Start     Ordered   07/31/21 2316  Diet Heart Room service appropriate? Yes; Fluid consistency: Thin  Diet effective now       Question Answer Comment  Room service appropriate? Yes    Fluid consistency: Thin      07/31/21 2321            Subjective 08/01/21    Pt reports feeling improved today. She has no complaints at this time  Disposition Plan & Communication  Patient status: Inpatient  Admitted From: SNF Disposition: Skilled nursing facility Anticipated discharge date: 11/12  Family Communication: none at bedside  Consults, Procedures, Significant Events  Consultants:  cardiology  Procedures/significant events:  none Antimicrobials:  Anti-infectives (From admission, onward)    Start     Dose/Rate Route Frequency Ordered Stop   07/31/21 2200  cefTRIAXone (ROCEPHIN) 1 g in sodium chloride 0.9 % 100 mL IVPB        1 g 200 mL/hr over 30 Minutes Intravenous  Once 07/31/21 2157 08/01/21 0139       Objective   Vitals:   07/31/21 2312 08/01/21 0344 08/01/21 0746 08/01/21 1123  BP: (!) 123/46 (!) 109/32 98/70 (!) 95/49  Pulse: (!) 52 (!) 57 (!) 58 (!) 58  Resp: 18 18 17 19   Temp: (!) 97.5 F (36.4 C) 98.3 F (36.8 C) 97.9 F (36.6 C) 98 F (36.7 C)  TempSrc: Oral     SpO2: 100% 100% 100% 97%  Weight:  72.9 kg    Height:        Intake/Output Summary (Last 24 hours) at 08/01/2021 1333 Last data filed at 08/01/2021 1018 Gross per 24 hour  Intake 546.05 ml  Output 850 ml  Net -303.95 ml   Filed Weights   07/31/21 1548 08/01/21 0344  Weight: 75.8 kg 72.9 kg    Patient BMI: Body mass index is 29.4 kg/m.   Physical Exam: General: awake, alert, NAD HEENT: atraumatic, clear conjunctiva, anicteric sclera, moist mucus membranes, hearing grossly normal Respiratory: normal respiratory effort. Cardiovascular: normal S1/S2,  RRR, no JVD, murmurs, rubs, gallops, quick capillary refill  Gastrointestinal: soft, NT, ND, no HSM felt Nervous: A&O x3. no gross focal neurologic deficits, normal speech Extremities: moves all equally, no edema, normal tone Skin: dry, intact, normal temperature, normal color, No rashes, lesions or ulcers Psychiatry:  normal mood, congruent affect  Labs   I have personally reviewed following labs and imaging studies Admission on 07/31/2021  Component Date Value Ref Range Status   Sodium 07/31/2021 136  135 - 145 mmol/L Final   Potassium 07/31/2021 3.6  3.5 - 5.1 mmol/L Final   Chloride 07/31/2021 96 (A)  98 - 111 mmol/L Final   CO2 07/31/2021 26  22 - 32 mmol/L Final   Glucose, Bld 07/31/2021 114 (A)  70 - 99 mg/dL Final   BUN 07/31/2021 47 (A)  8 - 23 mg/dL Final   Creatinine, Ser 07/31/2021 2.74 (A)  0.44 - 1.00 mg/dL Final   Calcium 07/31/2021 8.5 (A)  8.9 - 10.3 mg/dL Final   Total Protein 07/31/2021 5.7 (A)  6.5 - 8.1 g/dL Final   Albumin 07/31/2021 2.4 (A)  3.5 - 5.0 g/dL Final   AST 07/31/2021 191 (A)  15 - 41 U/L Final   ALT 07/31/2021 133 (A)  0 - 44 U/L Final   Alkaline Phosphatase 07/31/2021 63  38 - 126 U/L Final   Total Bilirubin 07/31/2021 0.8  0.3 - 1.2 mg/dL Final   GFR, Estimated 07/31/2021 17 (A)  >60 mL/min Final   Anion gap 07/31/2021 14  5 - 15 Final   WBC 07/31/2021 4.1  4.0 - 10.5 K/uL Final   RBC 07/31/2021 3.25 (A)  3.87 - 5.11 MIL/uL Final   Hemoglobin 07/31/2021 8.9 (A)  12.0 - 15.0 g/dL Final   HCT 07/31/2021 29.4 (A)  36.0 - 46.0 % Final   MCV 07/31/2021 90.5  80.0 - 100.0 fL Final   MCH 07/31/2021 27.4  26.0 - 34.0 pg Final   MCHC 07/31/2021 30.3  30.0 - 36.0 g/dL Final   RDW 07/31/2021 16.2 (A)  11.5 - 15.5 % Final   Platelets 07/31/2021 192  150 - 400 K/uL Final   nRBC 07/31/2021 0.0  0.0 - 0.2 % Final   Neutrophils Relative % 07/31/2021 78  % Final   Neutro Abs 07/31/2021 3.2  1.7 - 7.7 K/uL Final   Lymphocytes Relative 07/31/2021 7  % Final   Lymphs Abs 07/31/2021 0.3 (A)  0.7 - 4.0 K/uL Final   Monocytes Relative 07/31/2021 11  % Final   Monocytes Absolute 07/31/2021 0.5  0.1 - 1.0 K/uL Final   Eosinophils Relative 07/31/2021 1  % Final   Eosinophils Absolute 07/31/2021 0.0  0.0 - 0.5 K/uL Final   Basophils Relative 07/31/2021 2  % Final   Basophils  Absolute 07/31/2021 0.1  0.0 - 0.1 K/uL Final   Immature Granulocytes 07/31/2021 1  % Final   Abs Immature Granulocytes 07/31/2021 0.05  0.00 - 0.07 K/uL Final   Troponin I (High Sensitivity) 07/31/2021 144 (A)  <18 ng/L Final   Lactic Acid, Venous 07/31/2021 2.4 (A)  0.5 - 1.9 mmol/L Final   Lactic Acid, Venous 07/31/2021 1.3  0.5 - 1.9 mmol/L Final   Color, Urine 07/31/2021 STRAW (A)  YELLOW Final   APPearance 07/31/2021 CLEAR (A)  CLEAR Final   Specific Gravity, Urine 07/31/2021 1.006  1.005 - 1.030 Final   pH 07/31/2021 6.0  5.0 - 8.0 Final   Glucose, UA 07/31/2021 NEGATIVE  NEGATIVE mg/dL Final   Hgb urine dipstick 07/31/2021 LARGE (A)  NEGATIVE Final   Bilirubin Urine 07/31/2021 NEGATIVE  NEGATIVE Final   Ketones, ur 07/31/2021 NEGATIVE  NEGATIVE mg/dL Final   Protein, ur 07/31/2021 30 (A)  NEGATIVE mg/dL Final   Nitrite 07/31/2021 NEGATIVE  NEGATIVE Final   Leukocytes,Ua 07/31/2021 SMALL (A)  NEGATIVE Final   WBC, UA 07/31/2021 0-5  0 - 5 WBC/hpf Final   Bacteria, UA 07/31/2021 RARE (A)  NONE SEEN Final   Squamous Epithelial / LPF 07/31/2021 0-5  0 - 5 Final   B Natriuretic Peptide 07/31/2021 274.2 (A)  0.0 - 100.0 pg/mL Final   SARS Coronavirus 2 by RT PCR 07/31/2021 NEGATIVE  NEGATIVE Final   Influenza A by PCR 07/31/2021 NEGATIVE  NEGATIVE Final   Influenza B by PCR 07/31/2021 NEGATIVE  NEGATIVE Final   Troponin I (High Sensitivity) 07/31/2021 139 (A)  <18 ng/L Final   TSH 07/31/2021 1.066  0.350 - 4.500 uIU/mL Final   Specimen Description 07/31/2021 BLOOD BLOOD LEFT HAND   Final   Special Requests 07/31/2021 BOTTLES DRAWN AEROBIC AND ANAEROBIC Blood Culture results may not be optimal due to an inadequate volume of blood received in culture bottles   Final   Culture 07/31/2021    Final                   Value:NO GROWTH <  12 HOURS Performed at [facility], [address] [place], [place] [address]    Report Status 07/31/2021 PENDING   Incomplete   Specimen  Description 07/31/2021 BLOOD LEFT HAND   Final   Special Requests 07/31/2021 BOTTLES DRAWN AEROBIC AND ANAEROBIC Blood Culture results may not be optimal due to an inadequate volume of blood received in culture bottles   Final   Culture 07/31/2021    Final                   Value:NO GROWTH < 12 HOURS Performed at [facility], [address] [place], [place] [address]    Report Status 07/31/2021 PENDING   Incomplete   MRSA by PCR Next Gen 07/31/2021 NOT DETECTED  NOT DETECTED Final   Sodium 08/01/2021 138  135 - 145 mmol/L Final   Potassium 08/01/2021 2.5 (A)  3.5 - 5.1 mmol/L Final   Chloride 08/01/2021 104  98 - 111 mmol/L Final   CO2 08/01/2021 25  22 - 32 mmol/L Final   Glucose, Bld 08/01/2021 86  70 - 99 mg/dL Final   BUN 08/01/2021 47 (A)  8 - 23 mg/dL Final   Creatinine, Ser 08/01/2021 2.43 (A)  0.44 - 1.00 mg/dL Final   Calcium 08/01/2021 7.5 (A)  8.9 - 10.3 mg/dL Final   Total Protein 08/01/2021 5.2 (A)  6.5 - 8.1 g/dL Final   Albumin 08/01/2021 2.4 (A)  3.5 - 5.0 g/dL Final   AST 08/01/2021 178 (A)  15 - 41 U/L Final   ALT 08/01/2021 124 (A)  0 - 44 U/L Final   Alkaline Phosphatase 08/01/2021 57  38 - 126 U/L Final   Total Bilirubin 08/01/2021 0.9  0.3 - 1.2 mg/dL Final   GFR, Estimated 08/01/2021 19 (A)  >60 mL/min Final   Anion gap 08/01/2021 9  5 - 15 Final   WBC 08/01/2021 3.1 (A)  4.0 - 10.5 K/uL Final   RBC 08/01/2021 2.96 (A)  3.87 - 5.11 MIL/uL Final   Hemoglobin 08/01/2021 7.9 (A)  12.0 - 15.0 g/dL Final   HCT 08/01/2021 26.6 (A)  36.0 - 46.0 % Final   MCV 08/01/2021 89.9  80.0 - 100.0 fL Final   MCH 08/01/2021 26.7  26.0 - 34.0 pg Final   MCHC 08/01/2021 29.7 (A)  30.0 - 36.0 g/dL Final   RDW 08/01/2021 16.1 (A)  11.5 - 15.5 % Final   Platelets 08/01/2021 164  150 - 400 K/uL Final   nRBC 08/01/2021 0.0  0.0 - 0.2 % Final   TSH 08/01/2021 1.466  0.350 - 4.500 uIU/mL Final   Procalcitonin 07/31/2021 0.16  ng/mL Final   Procalcitonin 08/01/2021 0.13   ng/mL Final   Weight 08/01/2021 2,571.45  oz Final   Height 08/01/2021 62  in Final   BP 08/01/2021 98/70  mmHg Final   Ao pk vel 08/01/2021 1.84  m/s Final   AV Area VTI 08/01/2021 2.22  cm2 Final   AR max vel 08/01/2021 2.16  cm2 Final   AV Mean grad 08/01/2021 8.0  mmHg Final   AV Peak grad 08/01/2021 13.5  mmHg Final   S' Lateral 08/01/2021 2.70  cm Final   AV Area mean vel 08/01/2021 2.16  cm2 Final   Area-P 1/2 08/01/2021 3.56  cm2 Final   MV VTI 08/01/2021 2.58  cm2 Final   Magnesium 08/01/2021 2.0  1.7 - 2.4 mg/dL Final   Vitamin B-12 08/01/2021 702  180 - 914  pg/mL Final   Folate 08/01/2021 15.2  >5.9 ng/mL Final   Iron 08/01/2021 35  28 - 170 ug/dL Final   TIBC 08/01/2021 255  250 - 450 ug/dL Final   Saturation Ratios 08/01/2021 14  10.4 - 31.8 % Final   UIBC 08/01/2021 220  ug/dL Final   Ferritin 08/01/2021 73  11 - 307 ng/mL Final   Retic Ct Pct 08/01/2021 1.9  0.4 - 3.1 % Final   RBC. 08/01/2021 3.02 (A)  3.87 - 5.11 MIL/uL Final   Retic Count, Absolute 08/01/2021 57.1  19.0 - 186.0 K/uL Final   Immature Retic Fract 08/01/2021 16.2 (A)  2.3 - 15.9 % Final    Imaging Studies  DG Chest Portable 1 View  Result Date: 07/31/2021 CLINICAL DATA:  Weakness over the last 2 days.  Question pneumonia. EXAM: PORTABLE CHEST 1 VIEW COMPARISON:  None. FINDINGS: Heart size is normal. Tortuosity of the aorta. The lungs are clear. Evidence of infiltrate, collapse or effusion. No sign of heart failure. No acute bone finding. IMPRESSION: Tortuosity of the aorta. No active disease evident. No sign of pneumonia. Electronically Signed   By: Nelson Chimes M.D.   On: 07/31/2021 17:00   ECHOCARDIOGRAM COMPLETE  Result Date: 08/01/2021    ECHOCARDIOGRAM REPORT   Patient Name:   POOJA CAMUSO Date of Exam: 08/01/2021 Medical Rec #:  [id number]        Height:       62.0 in Accession #:    [id number]       Weight:       160.7 lb Date of Birth:  02-04-1935        BSA:          1.742 m Patient Age:     71 years         BP:           98/70 mmHg Patient Gender: F                HR:           68 bpm. Exam Location:  [facility] Procedure: 2D Echo, Color Doppler and Cardiac Doppler Indications:     I21.4 NSTEMI  History:         Patient has no prior history of Echocardiogram examinations.                  CHF, Stroke and CKD; Risk Factors:Hypertension and                  Dyslipidemia.  Sonographer:     Charmayne Sheer Referring Phys:  [id number] Athena Masse Diagnosing Phys: Ida Rogue MD IMPRESSIONS  1. Left ventricular ejection fraction, by estimation, is 60 to 65%. The left ventricle has normal function. The left ventricle has no regional wall motion abnormalities. Left ventricular diastolic parameters are indeterminate.  2. Right ventricular systolic function is normal. The right ventricular size is normal. Tricuspid regurgitation signal is inadequate for assessing PA pressure.  3. Left atrial size was mildly dilated.  4. The mitral valve is normal in structure. Mild mitral valve regurgitation. No evidence of mitral stenosis.  5. The aortic valve is normal in structure. Aortic valve regurgitation is mild. Mild aortic valve sclerosis is present, with no evidence of aortic valve stenosis.  6. There is borderline dilatation of the ascending aorta, measuring 36 mm.  7. The inferior vena cava is normal in size with greater than 50% respiratory variability, suggesting right atrial pressure  of 3 mmHg. FINDINGS  Left Ventricle: Left ventricular ejection fraction, by estimation, is 60 to 65%. The left ventricle has normal function. The left ventricle has no regional wall motion abnormalities. The left ventricular internal cavity size was normal in size. There is  no left ventricular hypertrophy. Left ventricular diastolic parameters are indeterminate. Right Ventricle: The right ventricular size is normal. No increase in right ventricular wall thickness. Right ventricular systolic function is normal. Tricuspid regurgitation signal  is inadequate for assessing PA pressure. Left Atrium: Left atrial size was mildly dilated. Right Atrium: Right atrial size was normal in size. Pericardium: There is no evidence of pericardial effusion. Mitral Valve: The mitral valve is normal in structure. Mild mitral valve regurgitation. No evidence of mitral valve stenosis. MV peak gradient, 7.2 mmHg. The mean mitral valve gradient is 3.0 mmHg. Tricuspid Valve: The tricuspid valve is normal in structure. Tricuspid valve regurgitation is mild . No evidence of tricuspid stenosis. Aortic Valve: The aortic valve is normal in structure. Aortic valve regurgitation is mild. Mild aortic valve sclerosis is present, with no evidence of aortic valve stenosis. Aortic valve mean gradient measures 8.0 mmHg. Aortic valve peak gradient measures 13.5 mmHg. Aortic valve area, by VTI measures 2.22 cm. Pulmonic Valve: The pulmonic valve was normal in structure. Pulmonic valve regurgitation is not visualized. No evidence of pulmonic stenosis. Aorta: The aortic root is normal in size and structure. There is borderline dilatation of the ascending aorta, measuring 36 mm. Venous: The inferior vena cava is normal in size with greater than 50% respiratory variability, suggesting right atrial pressure of 3 mmHg. IAS/Shunts: No atrial level shunt detected by color flow Doppler.  LEFT VENTRICLE PLAX 2D LVIDd:         4.70 cm   Diastology LVIDs:         2.70 cm   LV e' medial:    11.00 cm/s LV PW:         1.30 cm   LV E/e' medial:  12.2 LV IVS:        0.80 cm   LV e' lateral:   10.30 cm/s LVOT diam:     1.90 cm   LV E/e' lateral: 13.1 LV SV:         89 LV SV Index:   51 LVOT Area:     2.84 cm  RIGHT VENTRICLE RV Basal diam:  3.30 cm RV S prime:     14.90 cm/s LEFT ATRIUM             Index        RIGHT ATRIUM           Index LA diam:        4.40 cm 2.53 cm/m   RA Area:     13.20 cm LA Vol (A2C):   95.7 ml 54.94 ml/m  RA Volume:   26.90 ml  15.44 ml/m LA Vol (A4C):   65.6 ml 37.66 ml/m LA  Biplane Vol: 81.1 ml 46.56 ml/m  AORTIC VALVE                     PULMONIC VALVE AV Area (Vmax):    2.16 cm      PV Vmax:       1.19 m/s AV Area (Vmean):   2.16 cm      PV Vmean:      77.100 cm/s AV Area (VTI):     2.22 cm      PV VTI:  0.226 m AV Vmax:           184.00 cm/s   PV Peak grad:  5.7 mmHg AV Vmean:          131.000 cm/s  PV Mean grad:  3.0 mmHg AV VTI:            0.399 m AV Peak Grad:      13.5 mmHg AV Mean Grad:      8.0 mmHg LVOT Vmax:         140.00 cm/s LVOT Vmean:        99.800 cm/s LVOT VTI:          0.313 m LVOT/AV VTI ratio: 0.78  AORTA Ao Root diam: 3.00 cm MITRAL VALVE MV Area (PHT): 3.56 cm     SHUNTS MV Area VTI:   2.58 cm     Systemic VTI:  0.31 m MV Peak grad:  7.2 mmHg     Systemic Diam: 1.90 cm MV Mean grad:  3.0 mmHg MV Vmax:       1.34 m/s MV Vmean:      71.7 cm/s MV Decel Time: 213 msec MV E velocity: 134.50 cm/s Ida Rogue MD Electronically signed by Ida Rogue MD Signature Date/Time: 08/01/2021/12:41:40 PM    Final    Medications   Scheduled Meds:  apixaban  2.5 mg Oral BID   [START ON 08/02/2021] ferrous sulfate  325 mg Oral QODAY   levothyroxine  50 mcg Oral Q0600   potassium chloride  40 mEq Oral BID   predniSONE  20 mg Oral Q breakfast   No recently discontinued medications to reconcile  LOS: 1 day   Time spent: >20min  Kadeem Hyle L Shainna Faux, DO [facility] 08/01/2021, 1:33 PM   Please refer to amion to contact the [facility] Attending or Consulting provider for this pt  [email or web address] Available by Epic secure chat 7AM-7PM. If 7PM-7AM, please contact night-coverage

## 2021-08-01 NOTE — Progress Notes (Signed)
[facility]  ROUNDING NOTE   Subjective:   Penny White is a 86 year old female with a past medical history of lymphoma, CAD, recurrent TIA, polymyalgia rheumatic on chronic prednisone, hypothyroidism, atrial fibrillation on Eliquis, heart failure with preserved EF, and chronic kidney disease stage IV with anemia.  Patient presents to the emergency room with progressive weakness and fatigue for 2 weeks.  She will be admitted for Elevated troponin [R77.8]  Patient is known to our practice and receives care from Dr. Juleen China.  She was last seen in office on July 23, 2021.  Patient is seen resting in bed with son at bedside.  Patient is normally able to ambulate around home with minimal assistance but has needed a wheelchair recently.  She does admit to decreased appetite but denies nausea, vomiting, diarrhea.  Denies shortness of breath and chest pain.  Denies headache, weakness, and abdominal pain.  Son had stated lower extremity edema that had worsened but doctor prescribed increased dose of torsemide.  This improves the edema.  Labs on arrival include sodium 136, potassium 3.6, BUN 47, creatinine 2.74, albumin 2.4 and GFR 17.  Chest x-ray negative for pneumonia We have been consulted due to acute kidney injury   Objective:  Vital signs in last 24 hours:  Temp:  [97.5 F (36.4 C)-98.3 F (36.8 C)] 98 F (36.7 C) (11/10 1123) Pulse Rate:  [45-62] 58 (11/10 1123) Resp:  [11-20] 19 (11/10 1123) BP: (91-123)/(32-70) 95/49 (11/10 1123) SpO2:  [90 %-100 %] 97 % (11/10 1123) Weight:  [72.9 kg-75.8 kg] 72.9 kg (11/10 0344)  Weight change:  Filed Weights   07/31/21 1548 08/01/21 0344  Weight: 75.8 kg 72.9 kg    Intake/Output: I/O last 3 completed shifts: In: 426.1 [I.V.:310.2; IV Piggyback:115.9] Out: -    Intake/Output this shift:  Total I/O In: 120 [P.O.:120] Out: 1150 [Urine:1150]  Physical Exam: General: NAD, resting in bed  Head: Normocephalic, atraumatic.  Moist oral mucosal membranes  Eyes: Anicteric  Lungs:  Clear to auscultation, normal breathing effort  Heart: Regular rate and rhythm  Abdomen:  Soft, nontender,   Extremities: No peripheral edema.  Neurologic: Nonfocal, moving all four extremities  Skin: No lesions   Basic Metabolic Panel: Recent Labs  Lab 07/31/21 1713 08/01/21 0350 08/01/21 0553  NA 136 138  --   K 3.6 2.5*  --   CL 96* 104  --   CO2 26 25  --   GLUCOSE 114* 86  --   BUN 47* 47*  --   CREATININE 2.74* 2.43*  --   CALCIUM 8.5* 7.5*  --   MG  --   --  2.0    Liver Function Tests: Recent Labs  Lab 07/31/21 1713 08/01/21 0350  AST 191* 178*  ALT 133* 124*  ALKPHOS 63 57  BILITOT 0.8 0.9  PROT 5.7* 5.2*  ALBUMIN 2.4* 2.4*   No results for input(s): LIPASE, AMYLASE in the last 168 hours. No results for input(s): AMMONIA in the last 168 hours.  CBC: Recent Labs  Lab 07/31/21 1713 08/01/21 0350  WBC 4.1 3.1*  NEUTROABS 3.2  --   HGB 8.9* 7.9*  HCT 29.4* 26.6*  MCV 90.5 89.9  PLT 192 164    Cardiac Enzymes: Recent Labs  Lab 08/01/21 0553  CKTOTAL 3,525*    BNP: Invalid input(s): POCBNP  CBG: No results for input(s): GLUCAP in the last 168 hours.  Microbiology: Results for orders placed or performed during the hospital encounter  of 07/31/21  Resp Panel by RT-PCR (Flu A&B, Covid) Nasopharyngeal Swab     Status: None   Collection Time: 07/31/21  4:55 PM   Specimen: Nasopharyngeal Swab; Nasopharyngeal(NP) swabs in vial transport medium  Result Value Ref Range Status   SARS Coronavirus 2 by RT PCR NEGATIVE NEGATIVE Final    Comment: (NOTE) SARS-CoV-2 target nucleic acids are NOT DETECTED.  The SARS-CoV-2 RNA is generally detectable in upper respiratory specimens during the acute phase of infection. The lowest concentration of SARS-CoV-2 viral copies this assay can detect is 138 copies/mL. A negative result does not preclude SARS-Cov-2 infection and should not be used as the sole  basis for treatment or other patient management decisions. A negative result may occur with  improper specimen collection/handling, submission of specimen other than nasopharyngeal swab, presence of viral mutation(s) within the areas targeted by this assay, and inadequate number of viral copies(<138 copies/mL). A negative result must be combined with clinical observations, patient history, and epidemiological information. The expected result is Negative.  Fact Sheet for Patients:  EntrepreneurPulse.com.au  Fact Sheet for Healthcare Providers:  IncredibleEmployment.be  This test is no t yet approved or cleared by the [place] FDA and  has been authorized for detection and/or diagnosis of SARS-CoV-2 by FDA under an Emergency Use Authorization (EUA). This EUA will remain  in effect (meaning this test can be used) for the duration of the COVID-19 declaration under Section 564(b)(1) of the Act, 21 U.S.C.section 360bbb-3(b)(1), unless the authorization is terminated  or revoked sooner.       Influenza A by PCR NEGATIVE NEGATIVE Final   Influenza B by PCR NEGATIVE NEGATIVE Final    Comment: (NOTE) The Xpert Xpress SARS-CoV-2/FLU/RSV plus assay is intended as an aid in the diagnosis of influenza from Nasopharyngeal swab specimens and should not be used as a sole basis for treatment. Nasal washings and aspirates are unacceptable for Xpert Xpress SARS-CoV-2/FLU/RSV testing.  Fact Sheet for Patients: EntrepreneurPulse.com.au  Fact Sheet for Healthcare Providers: IncredibleEmployment.be  This test is not yet approved or cleared by the [place] FDA and has been authorized for detection and/or diagnosis of SARS-CoV-2 by FDA under an Emergency Use Authorization (EUA). This EUA will remain in effect (meaning this test can be used) for the duration of the COVID-19 declaration under Section 564(b)(1) of the Act,  21 U.S.C. section 360bbb-3(b)(1), unless the authorization is terminated or revoked.  Performed at [facility], [address] [place], [place] [address]   Blood culture (routine x 2)     Status: None (Preliminary result)   Collection Time: 07/31/21  8:04 PM   Specimen: BLOOD  Result Value Ref Range Status   Specimen Description BLOOD BLOOD LEFT HAND  Final   Special Requests   Final    BOTTLES DRAWN AEROBIC AND ANAEROBIC Blood Culture results may not be optimal due to an inadequate volume of blood received in culture bottles   Culture   Final    NO GROWTH < 12 HOURS Performed at [facility], [address] [place], [place] [address]    Report Status PENDING  Incomplete  MRSA Next Gen by PCR, Nasal     Status: None   Collection Time: 07/31/21 11:20 PM   Specimen: Nasal Mucosa; Nasal Swab  Result Value Ref Range Status   MRSA by PCR Next Gen NOT DETECTED NOT DETECTED Final    Comment: (NOTE) The GeneXpert MRSA Assay (FDA approved for NASAL specimens only), is one component  of a comprehensive MRSA colonization surveillance program. It is not intended to diagnose MRSA infection nor to guide or monitor treatment for MRSA infections. Test performance is not FDA approved in patients less than 39 years old. Performed at [facility], [address] [place], [place] [address]   Blood culture (routine x 2)     Status: None (Preliminary result)   Collection Time: 07/31/21 11:56 PM   Specimen: BLOOD  Result Value Ref Range Status   Specimen Description BLOOD LEFT HAND  Final   Special Requests   Final    BOTTLES DRAWN AEROBIC AND ANAEROBIC Blood Culture results may not be optimal due to an inadequate volume of blood received in culture bottles   Culture   Final    NO GROWTH < 12 HOURS Performed at [facility], [address] [place], [place] [address]    Report Status PENDING  Incomplete    Coagulation Studies: No results for  input(s): LABPROT, INR in the last 72 hours.  Urinalysis: Recent Labs    07/31/21 1652  COLORURINE STRAW*  LABSPEC 1.006  PHURINE 6.0  GLUCOSEU NEGATIVE  HGBUR LARGE*  BILIRUBINUR NEGATIVE  KETONESUR NEGATIVE  PROTEINUR 30*  NITRITE NEGATIVE  LEUKOCYTESUR SMALL*      Imaging: DG Chest Portable 1 View  Result Date: 07/31/2021 CLINICAL DATA:  Weakness over the last 2 days.  Question pneumonia. EXAM: PORTABLE CHEST 1 VIEW COMPARISON:  None. FINDINGS: Heart size is normal. Tortuosity of the aorta. The lungs are clear. Evidence of infiltrate, collapse or effusion. No sign of heart failure. No acute bone finding. IMPRESSION: Tortuosity of the aorta. No active disease evident. No sign of pneumonia. Electronically Signed   By: Nelson Chimes M.D.   On: 07/31/2021 17:00   ECHOCARDIOGRAM COMPLETE  Result Date: 08/01/2021    ECHOCARDIOGRAM REPORT   Patient Name:   Penny White Date of Exam: 08/01/2021 Medical Rec #:  [id number]        Height:       62.0 in Accession #:    [id number]       Weight:       160.7 lb Date of Birth:  1935-06-22        BSA:          1.742 m Patient Age:    89 years         BP:           98/70 mmHg Patient Gender: F                HR:           68 bpm. Exam Location:  [facility] Procedure: 2D Echo, Color Doppler and Cardiac Doppler Indications:     I21.4 NSTEMI  History:         Patient has no prior history of Echocardiogram examinations.                  CHF, Stroke and CKD; Risk Factors:Hypertension and                  Dyslipidemia.  Sonographer:     Charmayne Sheer Referring Phys:  [id number] Athena Masse Diagnosing Phys: Ida Rogue MD IMPRESSIONS  1. Left ventricular ejection fraction, by estimation, is 60 to 65%. The left ventricle has normal function. The left ventricle has no regional wall motion abnormalities. Left ventricular diastolic parameters are indeterminate.  2. Right ventricular systolic function is normal. The right ventricular size is  normal. Tricuspid  regurgitation signal is inadequate for assessing PA pressure.  3. Left atrial size was mildly dilated.  4. The mitral valve is normal in structure. Mild mitral valve regurgitation. No evidence of mitral stenosis.  5. The aortic valve is normal in structure. Aortic valve regurgitation is mild. Mild aortic valve sclerosis is present, with no evidence of aortic valve stenosis.  6. There is borderline dilatation of the ascending aorta, measuring 36 mm.  7. The inferior vena cava is normal in size with greater than 50% respiratory variability, suggesting right atrial pressure of 3 mmHg. FINDINGS  Left Ventricle: Left ventricular ejection fraction, by estimation, is 60 to 65%. The left ventricle has normal function. The left ventricle has no regional wall motion abnormalities. The left ventricular internal cavity size was normal in size. There is  no left ventricular hypertrophy. Left ventricular diastolic parameters are indeterminate. Right Ventricle: The right ventricular size is normal. No increase in right ventricular wall thickness. Right ventricular systolic function is normal. Tricuspid regurgitation signal is inadequate for assessing PA pressure. Left Atrium: Left atrial size was mildly dilated. Right Atrium: Right atrial size was normal in size. Pericardium: There is no evidence of pericardial effusion. Mitral Valve: The mitral valve is normal in structure. Mild mitral valve regurgitation. No evidence of mitral valve stenosis. MV peak gradient, 7.2 mmHg. The mean mitral valve gradient is 3.0 mmHg. Tricuspid Valve: The tricuspid valve is normal in structure. Tricuspid valve regurgitation is mild . No evidence of tricuspid stenosis. Aortic Valve: The aortic valve is normal in structure. Aortic valve regurgitation is mild. Mild aortic valve sclerosis is present, with no evidence of aortic valve stenosis. Aortic valve mean gradient measures 8.0 mmHg. Aortic valve peak gradient measures 13.5 mmHg. Aortic valve area,  by VTI measures 2.22 cm. Pulmonic Valve: The pulmonic valve was normal in structure. Pulmonic valve regurgitation is not visualized. No evidence of pulmonic stenosis. Aorta: The aortic root is normal in size and structure. There is borderline dilatation of the ascending aorta, measuring 36 mm. Venous: The inferior vena cava is normal in size with greater than 50% respiratory variability, suggesting right atrial pressure of 3 mmHg. IAS/Shunts: No atrial level shunt detected by color flow Doppler.  LEFT VENTRICLE PLAX 2D LVIDd:         4.70 cm   Diastology LVIDs:         2.70 cm   LV e' medial:    11.00 cm/s LV PW:         1.30 cm   LV E/e' medial:  12.2 LV IVS:        0.80 cm   LV e' lateral:   10.30 cm/s LVOT diam:     1.90 cm   LV E/e' lateral: 13.1 LV SV:         89 LV SV Index:   51 LVOT Area:     2.84 cm  RIGHT VENTRICLE RV Basal diam:  3.30 cm RV S prime:     14.90 cm/s LEFT ATRIUM             Index        RIGHT ATRIUM           Index LA diam:        4.40 cm 2.53 cm/m   RA Area:     13.20 cm LA Vol (A2C):   95.7 ml 54.94 ml/m  RA Volume:   26.90 ml  15.44 ml/m LA Vol (A4C):  65.6 ml 37.66 ml/m LA Biplane Vol: 81.1 ml 46.56 ml/m  AORTIC VALVE                     PULMONIC VALVE AV Area (Vmax):    2.16 cm      PV Vmax:       1.19 m/s AV Area (Vmean):   2.16 cm      PV Vmean:      77.100 cm/s AV Area (VTI):     2.22 cm      PV VTI:        0.226 m AV Vmax:           184.00 cm/s   PV Peak grad:  5.7 mmHg AV Vmean:          131.000 cm/s  PV Mean grad:  3.0 mmHg AV VTI:            0.399 m AV Peak Grad:      13.5 mmHg AV Mean Grad:      8.0 mmHg LVOT Vmax:         140.00 cm/s LVOT Vmean:        99.800 cm/s LVOT VTI:          0.313 m LVOT/AV VTI ratio: 0.78  AORTA Ao Root diam: 3.00 cm MITRAL VALVE MV Area (PHT): 3.56 cm     SHUNTS MV Area VTI:   2.58 cm     Systemic VTI:  0.31 m MV Peak grad:  7.2 mmHg     Systemic Diam: 1.90 cm MV Mean grad:  3.0 mmHg MV Vmax:       1.34 m/s MV Vmean:      71.7 cm/s MV  Decel Time: 213 msec MV E velocity: 134.50 cm/s Ida Rogue MD Electronically signed by Ida Rogue MD Signature Date/Time: 08/01/2021/12:41:40 PM    Final      Medications:     apixaban  2.5 mg Oral BID   [START ON 08/02/2021] ferrous sulfate  325 mg Oral QODAY   levothyroxine  50 mcg Oral Q0600   potassium chloride  40 mEq Oral BID   predniSONE  20 mg Oral Q breakfast   acetaminophen **OR** acetaminophen, ondansetron **OR** ondansetron (ZOFRAN) IV, polyvinyl alcohol  Assessment/ Plan:  Penny White is a 86 y.o.  female with a past medical history of lymphoma, CAD, recurrent TIA, polymyalgia rheumatic on chronic prednisone, hypothyroidism, atrial fibrillation on Eliquis, heart failure with preserved EF, and chronic kidney disease stage IV with anemia.  Patient has been admitted for Elevated troponin [R77.8]   Acute Kidney Injury with hypokalemia on chronic kidney disease stage 4 with baseline creatinine 2.03 and GFR of 24 on 07/23/21.  Acute kidney injury secondary to suspected over diuresis No exposure to IV contrast.  Agree with losartan and torsemide be held.  1.5 L normal saline bolus given.  Currently receiving IV fluids with supplemental potassium at 75 mL/h. IV and oral supplementation for potassium 2.5.  We will continue to monitor  Lab Results  Component Value Date   CREATININE 2.43 (H) 08/01/2021   CREATININE 2.74 (H) 07/31/2021   CREATININE 2.03 (H) 07/23/2021    Intake/Output Summary (Last 24 hours) at 08/01/2021 1501 Last data filed at 08/01/2021 1432 Gross per 24 hour  Intake 546.05 ml  Output 1150 ml  Net -603.95 ml   2. Anemia of chronic kidney disease Lab Results  Component Value Date   HGB 7.9 (L) 08/01/2021  Hemoglobin below target, may be from sample dilution.  We will monitor daily results.  Defer need for blood transfusion to primary team.  Patient also followed by cancer center.  3. Secondary Hyperparathyroidism: with outpatient labs:  PTH 105, phosphorus 3.2, calcium 8.5 on 05/29/21.   Lab Results  Component Value Date   CALCIUM 7.5 (L) 08/01/2021  Calcium below target.  We will continue to monitor bone minerals during this admission.  4.  Hypertension with chronic kidney disease.  Home regimen includes losartan and torsemide.  Torsemide recently increased due to increased edema.  Both held at this time.  Current BP 95/49   LOS: 1   11/10/20223:01 PM

## 2021-08-01 NOTE — Progress Notes (Signed)
*  PRELIMINARY RESULTS* Echocardiogram 2D Echocardiogram has been performed.  Penny White 08/01/2021, 9:20 AM

## 2021-08-01 NOTE — Progress Notes (Signed)
Mobility Specialist - Progress Note   08/01/21 1500  Orthostatic Lying   BP- Lying 106/47  Orthostatic Sitting  BP- Sitting 119/61  Orthostatic Standing at 0 minutes  BP- Standing at 0 minutes (!) 133/115  Mobility  Activity Dangled on edge of bed;Stood at bedside  Level of Assistance Minimal assist, patient does 75% or more  Assistive Device Front wheel walker  Distance Ambulated (ft) 0 ft  Mobility Sit up in bed/chair position for meals  Mobility Response Tolerated well  Mobility performed by Mobility specialist  $Mobility charge 1 Mobility    Per discussion with RN, pt okay for mobility despite low K+ of 2.5 this date. Utilizing RA. Able to achieve figure 4 position but assistance needed to don socks. EOB with modA with ability to hip-shift forward. MinA STS with extra time. 4/10 Lightheadedness. HR 50-60s. Pt left EOB upon PT arrival.    Kathee Delton Mobility Specialist 08/01/21, 3:27 PM

## 2021-08-01 NOTE — Evaluation (Signed)
Physical Therapy Evaluation Patient Details Name: BLANNIE SHEDLOCK MRN: [id number] DOB: 1935/02/13 Today's Date: 08/01/2021  History of Present Illness  presented to ER sconday to generalized weakness, fatigue and hypotension; admitted for management of generalized weakness, AKI.  Noted with mild elevation in troponin (peak 144), demand ischemia per notes.  Clinical Impression  Patient seated edge of bed upon arrival to room; just completed bed mobility and orthostatic measurement with mobility specialist (orthostatics negative).  Patient denies pain; agreeable to continued mobility efforts with therapist.  Alert and oriented, follows commands, generally pleasant and cooperative.  Generally weak and deconditioned throughout all extremities; no focal weakness appreciated.  Able to complete sit/stand, basic transfers (bed/chair, chair/BSC) with RW, min/mod assist.  Very slow and deliberate in movement transitions; choppy stepping pattern, limited balance, limited activity tolerance noted.  Unsafe to attempt without RW and +1 assist at all times. Would benefit from skilled PT to address above deficits and promote optimal return to PLOF.; recommend transition to STR upon discharge from acute hospitalization.        Recommendations for follow up therapy are one component of a multi-disciplinary discharge planning process, led by the attending physician.  Recommendations may be updated based on patient status, additional functional criteria and insurance authorization.  Follow Up Recommendations Skilled nursing-short term rehab (<3 hours/day)    Assistance Recommended at Discharge    Functional Status Assessment Patient has had a recent decline in their functional status and demonstrates the ability to make significant improvements in function in a reasonable and predictable amount of time.  Equipment Recommendations       Recommendations for Other Services       Precautions / Restrictions  Precautions Precautions: Fall Restrictions Weight Bearing Restrictions: No      Mobility  Bed Mobility               General bed mobility comments: received seated edge of bed (after orthostatic assessment with mobility specialist); ended session seated in recliner    Transfers Overall transfer level: Needs assistance Equipment used: Rolling walker (2 wheels) Transfers: Sit to/from Stand;Bed to chair/wheelchair/BSC Sit to Stand: Mod assist;Min assist Stand pivot transfers: Min assist         General transfer comment: very slow and deliberate in movement transitions; choppy stepping pattern, poor balance, poor activity tolerance.    Ambulation/Gait               General Gait Details: Patient declined due to self-perceived weakness  Stairs            Wheelchair Mobility    Modified Rankin (Stroke Patients Only)       Balance Overall balance assessment: Needs assistance Sitting-balance support: Feet supported;No upper extremity supported Sitting balance-Leahy Scale: Good     Standing balance support: Bilateral upper extremity supported Standing balance-Leahy Scale: Fair                               Pertinent Vitals/Pain Pain Assessment: No/denies pain    Home Living Family/patient expects to be discharged to:: Assisted living                 Home Equipment: Rollator (4 wheels)      Prior Function Prior Level of Function : Needs assist       Physical Assist : ADLs (physical)   ADLs (physical): Bathing Mobility Comments: Mod indep with 4WRW inside indep apartment space; ambulates to/from  dining hall for meals       Hand Dominance        Extremity/Trunk Assessment   Upper Extremity Assessment Upper Extremity Assessment: Overall WFL for tasks assessed    Lower Extremity Assessment Lower Extremity Assessment: Generalized weakness (grossly 4-/5 throughout)       Communication   Communication: No  difficulties  Cognition Arousal/Alertness: Awake/alert Behavior During Therapy: WFL for tasks assessed/performed Overall Cognitive Status: Within Functional Limits for tasks assessed                                          General Comments      Exercises Other Exercises Other Exercises: Seated LE therex, 1x10, active ROM for muscular strength/endurance Other Exercises: Toilet transfer, SPT with RW, min/mod assist; sit/stand from [facility] With RW, min/mod assist; standing balance wiht RW for hygiene, min assist.  Dep assist for hygiene. Other Exercises: Significant difficulty with peripheral vision; requires full head turn (and max cuing) for visual scanning/awareness of items/space in periphery   Assessment/Plan    PT Assessment Patient needs continued PT services  PT Problem List Decreased strength;Decreased activity tolerance;Decreased balance;Decreased mobility;Decreased knowledge of use of DME;Decreased safety awareness;Decreased knowledge of precautions;Cardiopulmonary status limiting activity       PT Treatment Interventions DME instruction;Gait training;Functional mobility training;Therapeutic activities;Patient/family education;Balance training;Therapeutic exercise    PT Goals (Current goals can be found in the Care Plan section)  Acute Rehab PT Goals Patient Stated Goal: to get stronger PT Goal Formulation: With patient Time For Goal Achievement: 08/15/21 Potential to Achieve Goals: Good    Frequency Min 2X/week   Barriers to discharge        Co-evaluation               AM-PAC PT "6 Clicks" Mobility  Outcome Measure Help needed turning from your back to your side while in a flat bed without using bedrails?: A Little Help needed moving from lying on your back to sitting on the side of a flat bed without using bedrails?: A Little Help needed moving to and from a bed to a chair (including a wheelchair)?: A Little Help needed standing up from a  chair using your arms (e.g., wheelchair or bedside chair)?: A Lot Help needed to walk in hospital room?: A Lot Help needed climbing 3-5 steps with a railing? : A Lot 6 Click Score: 15    End of Session Equipment Utilized During Treatment: Gait belt Activity Tolerance: Patient tolerated treatment well Patient left: in chair;with call bell/phone within reach;with chair alarm set Nurse Communication: Mobility status PT Visit Diagnosis: Muscle weakness (generalized) (M62.81);Difficulty in walking, not elsewhere classified (R26.2)    Time: 4496-7591 PT Time Calculation (min) (ACUTE ONLY): 42 min   Charges:   PT Evaluation $PT Eval Moderate Complexity: 1 Mod PT Treatments $Therapeutic Exercise: 8-22 mins $Therapeutic Activity: 8-22 mins       Laurel Smeltz H. Owens Shark, PT, DPT, NCS 08/01/21, 11:27 PM [phone]

## 2021-08-01 NOTE — Progress Notes (Signed)
Mobility Specialist - Progress Note   08/01/21 1700  Mobility  Activity Transferred:  Chair to bed  Level of Assistance Minimal assist, patient does 75% or more  Assistive Device Front wheel walker  Distance Ambulated (ft) 2 ft  Mobility Sit up in bed/chair position for meals  Mobility Response Tolerated well  Mobility performed by Mobility specialist  $Mobility charge 1 Mobility    Pt transferred chair-bed with minA and extra time. Alarm set.   Kathee Delton Mobility Specialist 08/01/21, 5:07 PM

## 2021-08-02 DIAGNOSIS — Z7901 Long term (current) use of anticoagulants: Secondary | ICD-10-CM

## 2021-08-02 DIAGNOSIS — I4819 Other persistent atrial fibrillation: Secondary | ICD-10-CM

## 2021-08-02 DIAGNOSIS — R531 Weakness: Secondary | ICD-10-CM | POA: Diagnosis not present

## 2021-08-02 DIAGNOSIS — E860 Dehydration: Secondary | ICD-10-CM

## 2021-08-02 DIAGNOSIS — N183 Chronic kidney disease, stage 3 unspecified: Secondary | ICD-10-CM

## 2021-08-02 LAB — CBC
HCT: 27.4 % — ABNORMAL LOW (ref 36.0–46.0)
Hemoglobin: 8.1 g/dL — ABNORMAL LOW (ref 12.0–15.0)
MCH: 27.7 pg (ref 26.0–34.0)
MCHC: 29.6 g/dL — ABNORMAL LOW (ref 30.0–36.0)
MCV: 93.8 fL (ref 80.0–100.0)
Platelets: 178 10*3/uL (ref 150–400)
RBC: 2.92 MIL/uL — ABNORMAL LOW (ref 3.87–5.11)
RDW: 16.3 % — ABNORMAL HIGH (ref 11.5–15.5)
WBC: 4.1 10*3/uL (ref 4.0–10.5)
nRBC: 0 % (ref 0.0–0.2)

## 2021-08-02 LAB — PROCALCITONIN: Procalcitonin: <0.1 ng/mL

## 2021-08-02 LAB — COMPREHENSIVE METABOLIC PANEL
ALT: 134 U/L — ABNORMAL HIGH (ref 0–44)
AST: 148 U/L — ABNORMAL HIGH (ref 15–41)
Albumin: 2.4 g/dL — ABNORMAL LOW (ref 3.5–5.0)
Alkaline Phosphatase: 56 U/L (ref 38–126)
Anion gap: 8 (ref 5–15)
BUN: 43 mg/dL — ABNORMAL HIGH (ref 8–23)
CO2: 22 mmol/L (ref 22–32)
Calcium: 8 mg/dL — ABNORMAL LOW (ref 8.9–10.3)
Chloride: 109 mmol/L (ref 98–111)
Creatinine, Ser: 2.11 mg/dL — ABNORMAL HIGH (ref 0.44–1.00)
GFR, Estimated: 23 mL/min — ABNORMAL LOW (ref >60)
Glucose, Bld: 122 mg/dL — ABNORMAL HIGH (ref 70–99)
Potassium: 5.1 mmol/L (ref 3.5–5.1)
Sodium: 139 mmol/L (ref 135–145)
Total Bilirubin: 0.9 mg/dL (ref 0.3–1.2)
Total Protein: 5.2 g/dL — ABNORMAL LOW (ref 6.5–8.1)

## 2021-08-02 NOTE — Progress Notes (Signed)
Mobility Specialist - Progress Note   08/02/21 1100  Mobility  Activity Ambulated in hall  Level of Assistance Moderate assist, patient does 50-74%  Assistive Device Front wheel walker  Distance Ambulated (ft) 40 ft  Mobility Ambulated with assistance in hallway  Mobility Response Tolerated well  Mobility performed by Mobility specialist  $Mobility charge 1 Mobility    Pt ambulated in hallway with min-modA. Max verbal/tactile cueing for steering d/t difficulties with peripheral vision. Veers L. HR 65 bpm. Fatigue limiting further distance. Pt returned supine with modA. Left in bed with alarm set and family at bedside.    Kathee Delton Mobility Specialist 08/02/21, 11:47 AM

## 2021-08-02 NOTE — Care Management Important Message (Signed)
Important Message  Patient Details  Name: Penny White MRN: [id number] Date of Birth: 10-Feb-1935   Medicare Important Message Given:  N/A - LOS <3 / Initial given by admissions     [name] 08/02/2021, 10:44 AM

## 2021-08-02 NOTE — Progress Notes (Signed)
[facility]  ROUNDING NOTE   Subjective:   Penny White is a 86 year old female with a past medical history of lymphoma, CAD, recurrent TIA, polymyalgia rheumatic on chronic prednisone, hypothyroidism, atrial fibrillation on Eliquis, heart failure with preserved EF, and chronic kidney disease stage IV with anemia.  Patient presents to the emergency room with progressive weakness and fatigue for 2 weeks.  She will be admitted for Elevated troponin [R77.8]  Patient is known to our practice and receives care from Dr. Juleen China.  She was last seen in office on July 23, 2021.  Patient is seen resting in bed with son at bedside.  Patient is normally able to ambulate around home with minimal assistance but has needed a wheelchair recently.  She does admit to decreased appetite but denies nausea, vomiting, diarrhea.  Denies shortness of breath and chest pain.    Renal function improved today.  Creatinine down to 2.1 with an EGFR of 23.  Serum potassium also improved and up to 5.1.  Patient still feels weak.   Objective:  Vital signs in last 24 hours:  Temp:  [97.8 F (36.6 C)-98.7 F (37.1 C)] 98.2 F (36.8 C) (11/11 0748) Pulse Rate:  [52-79] 52 (11/11 0748) Resp:  [17-19] 18 (11/11 0748) BP: (95-144)/(29-54) 127/29 (11/11 0748) SpO2:  [91 %-99 %] 91 % (11/11 0748)  Weight change:  Filed Weights   07/31/21 1548 08/01/21 0344  Weight: 75.8 kg 72.9 kg    Intake/Output: I/O last 3 completed shifts: In: 1503.1 [P.O.:1077; I.V.:310.2; IV Piggyback:115.9] Out: 2500 [Urine:2500]   Intake/Output this shift:  Total I/O In: 120 [P.O.:120] Out: -   Physical Exam: General: NAD, resting in bed  Head: Normocephalic, atraumatic. Moist oral mucosal membranes  Eyes: Anicteric  Lungs:  Clear to auscultation, normal breathing effort  Heart: Regular rate and rhythm  Abdomen:  Soft, nontender,   Extremities: No peripheral edema.  Neurologic: Nonfocal, moving all four extremities   Skin: No lesions   Basic Metabolic Panel: Recent Labs  Lab 07/31/21 1713 08/01/21 0350 08/01/21 0553 08/02/21 0448  NA 136 138  --  139  K 3.6 2.5*  --  5.1  CL 96* 104  --  109  CO2 26 25  --  22  GLUCOSE 114* 86  --  122*  BUN 47* 47*  --  43*  CREATININE 2.74* 2.43*  --  2.11*  CALCIUM 8.5* 7.5*  --  8.0*  MG  --   --  2.0  --      Liver Function Tests: Recent Labs  Lab 07/31/21 1713 08/01/21 0350 08/02/21 0448  AST 191* 178* 148*  ALT 133* 124* 134*  ALKPHOS 63 57 56  BILITOT 0.8 0.9 0.9  PROT 5.7* 5.2* 5.2*  ALBUMIN 2.4* 2.4* 2.4*    No results for input(s): LIPASE, AMYLASE in the last 168 hours. No results for input(s): AMMONIA in the last 168 hours.  CBC: Recent Labs  Lab 07/31/21 1713 08/01/21 0350 08/02/21 0440  WBC 4.1 3.1* 4.1  NEUTROABS 3.2  --   --   HGB 8.9* 7.9* 8.1*  HCT 29.4* 26.6* 27.4*  MCV 90.5 89.9 93.8  PLT 192 164 178     Cardiac Enzymes: Recent Labs  Lab 08/01/21 0553  CKTOTAL 3,525*     BNP: Invalid input(s): POCBNP  CBG: No results for input(s): GLUCAP in the last 168 hours.  Microbiology: Results for orders placed or performed during the hospital encounter of 07/31/21  Resp  Panel by RT-PCR (Flu A&B, Covid) Nasopharyngeal Swab     Status: None   Collection Time: 07/31/21  4:55 PM   Specimen: Nasopharyngeal Swab; Nasopharyngeal(NP) swabs in vial transport medium  Result Value Ref Range Status   SARS Coronavirus 2 by RT PCR NEGATIVE NEGATIVE Final    Comment: (NOTE) SARS-CoV-2 target nucleic acids are NOT DETECTED.  The SARS-CoV-2 RNA is generally detectable in upper respiratory specimens during the acute phase of infection. The lowest concentration of SARS-CoV-2 viral copies this assay can detect is 138 copies/mL. A negative result does not preclude SARS-Cov-2 infection and should not be used as the sole basis for treatment or other patient management decisions. A negative result may occur with  improper  specimen collection/handling, submission of specimen other than nasopharyngeal swab, presence of viral mutation(s) within the areas targeted by this assay, and inadequate number of viral copies(<138 copies/mL). A negative result must be combined with clinical observations, patient history, and epidemiological information. The expected result is Negative.  Fact Sheet for Patients:  EntrepreneurPulse.com.au  Fact Sheet for Healthcare Providers:  IncredibleEmployment.be  This test is no t yet approved or cleared by the [place] FDA and  has been authorized for detection and/or diagnosis of SARS-CoV-2 by FDA under an Emergency Use Authorization (EUA). This EUA will remain  in effect (meaning this test can be used) for the duration of the COVID-19 declaration under Section 564(b)(1) of the Act, 21 U.S.C.section 360bbb-3(b)(1), unless the authorization is terminated  or revoked sooner.       Influenza A by PCR NEGATIVE NEGATIVE Final   Influenza B by PCR NEGATIVE NEGATIVE Final    Comment: (NOTE) The Xpert Xpress SARS-CoV-2/FLU/RSV plus assay is intended as an aid in the diagnosis of influenza from Nasopharyngeal swab specimens and should not be used as a sole basis for treatment. Nasal washings and aspirates are unacceptable for Xpert Xpress SARS-CoV-2/FLU/RSV testing.  Fact Sheet for Patients: EntrepreneurPulse.com.au  Fact Sheet for Healthcare Providers: IncredibleEmployment.be  This test is not yet approved or cleared by the [place] FDA and has been authorized for detection and/or diagnosis of SARS-CoV-2 by FDA under an Emergency Use Authorization (EUA). This EUA will remain in effect (meaning this test can be used) for the duration of the COVID-19 declaration under Section 564(b)(1) of the Act, 21 U.S.C. section 360bbb-3(b)(1), unless the authorization is terminated or revoked.  Performed at  [facility], [address] [place], [place] [address]   Blood culture (routine x 2)     Status: None (Preliminary result)   Collection Time: 07/31/21  8:04 PM   Specimen: BLOOD  Result Value Ref Range Status   Specimen Description BLOOD BLOOD LEFT HAND  Final   Special Requests   Final    BOTTLES DRAWN AEROBIC AND ANAEROBIC Blood Culture results may not be optimal due to an inadequate volume of blood received in culture bottles   Culture   Final    NO GROWTH 2 DAYS Performed at [facility], [address], [place], [place] [address]    Report Status PENDING  Incomplete  MRSA Next Gen by PCR, Nasal     Status: None   Collection Time: 07/31/21 11:20 PM   Specimen: Nasal Mucosa; Nasal Swab  Result Value Ref Range Status   MRSA by PCR Next Gen NOT DETECTED NOT DETECTED Final    Comment: (NOTE) The GeneXpert MRSA Assay (FDA approved for NASAL specimens only), is one component of a comprehensive MRSA colonization  surveillance program. It is not intended to diagnose MRSA infection nor to guide or monitor treatment for MRSA infections. Test performance is not FDA approved in patients less than 28 years old. Performed at [facility], [address] [place], [place] [address]   Blood culture (routine x 2)     Status: None (Preliminary result)   Collection Time: 07/31/21 11:56 PM   Specimen: BLOOD  Result Value Ref Range Status   Specimen Description BLOOD LEFT HAND  Final   Special Requests   Final    BOTTLES DRAWN AEROBIC AND ANAEROBIC Blood Culture results may not be optimal due to an inadequate volume of blood received in culture bottles   Culture   Final    NO GROWTH 1 DAY Performed at [facility], [address] [place], [place] [address]    Report Status PENDING  Incomplete    Coagulation Studies: No results for input(s): LABPROT, INR in the last 72 hours.  Urinalysis: Recent Labs    07/31/21 1652  COLORURINE STRAW*   LABSPEC 1.006  PHURINE 6.0  GLUCOSEU NEGATIVE  HGBUR LARGE*  BILIRUBINUR NEGATIVE  KETONESUR NEGATIVE  PROTEINUR 30*  NITRITE NEGATIVE  LEUKOCYTESUR SMALL*       Imaging: DG Chest Portable 1 View  Result Date: 07/31/2021 CLINICAL DATA:  Weakness over the last 2 days.  Question pneumonia. EXAM: PORTABLE CHEST 1 VIEW COMPARISON:  None. FINDINGS: Heart size is normal. Tortuosity of the aorta. The lungs are clear. Evidence of infiltrate, collapse or effusion. No sign of heart failure. No acute bone finding. IMPRESSION: Tortuosity of the aorta. No active disease evident. No sign of pneumonia. Electronically Signed   By: Nelson Chimes M.D.   On: 07/31/2021 17:00   ECHOCARDIOGRAM COMPLETE  Result Date: 08/01/2021    ECHOCARDIOGRAM REPORT   Patient Name:   Penny White Date of Exam: 08/01/2021 Medical Rec #:  [id number]        Height:       62.0 in Accession #:    [id number]       Weight:       160.7 lb Date of Birth:  Dec 03, 1934        BSA:          1.742 m Patient Age:    24 years         BP:           98/70 mmHg Patient Gender: F                HR:           68 bpm. Exam Location:  [facility] Procedure: 2D Echo, Color Doppler and Cardiac Doppler Indications:     I21.4 NSTEMI  History:         Patient has no prior history of Echocardiogram examinations.                  CHF, Stroke and CKD; Risk Factors:Hypertension and                  Dyslipidemia.  Sonographer:     Charmayne Sheer Referring Phys:  [id number] Athena Masse Diagnosing Phys: Ida Rogue MD IMPRESSIONS  1. Left ventricular ejection fraction, by estimation, is 60 to 65%. The left ventricle has normal function. The left ventricle has no regional wall motion abnormalities. Left ventricular diastolic parameters are indeterminate.  2. Right ventricular systolic function is normal. The right ventricular size is normal. Tricuspid regurgitation signal is  inadequate for assessing PA pressure.  3. Left atrial size was mildly dilated.  4. The mitral  valve is normal in structure. Mild mitral valve regurgitation. No evidence of mitral stenosis.  5. The aortic valve is normal in structure. Aortic valve regurgitation is mild. Mild aortic valve sclerosis is present, with no evidence of aortic valve stenosis.  6. There is borderline dilatation of the ascending aorta, measuring 36 mm.  7. The inferior vena cava is normal in size with greater than 50% respiratory variability, suggesting right atrial pressure of 3 mmHg. FINDINGS  Left Ventricle: Left ventricular ejection fraction, by estimation, is 60 to 65%. The left ventricle has normal function. The left ventricle has no regional wall motion abnormalities. The left ventricular internal cavity size was normal in size. There is  no left ventricular hypertrophy. Left ventricular diastolic parameters are indeterminate. Right Ventricle: The right ventricular size is normal. No increase in right ventricular wall thickness. Right ventricular systolic function is normal. Tricuspid regurgitation signal is inadequate for assessing PA pressure. Left Atrium: Left atrial size was mildly dilated. Right Atrium: Right atrial size was normal in size. Pericardium: There is no evidence of pericardial effusion. Mitral Valve: The mitral valve is normal in structure. Mild mitral valve regurgitation. No evidence of mitral valve stenosis. MV peak gradient, 7.2 mmHg. The mean mitral valve gradient is 3.0 mmHg. Tricuspid Valve: The tricuspid valve is normal in structure. Tricuspid valve regurgitation is mild . No evidence of tricuspid stenosis. Aortic Valve: The aortic valve is normal in structure. Aortic valve regurgitation is mild. Mild aortic valve sclerosis is present, with no evidence of aortic valve stenosis. Aortic valve mean gradient measures 8.0 mmHg. Aortic valve peak gradient measures 13.5 mmHg. Aortic valve area, by VTI measures 2.22 cm. Pulmonic Valve: The pulmonic valve was normal in structure. Pulmonic valve regurgitation is  not visualized. No evidence of pulmonic stenosis. Aorta: The aortic root is normal in size and structure. There is borderline dilatation of the ascending aorta, measuring 36 mm. Venous: The inferior vena cava is normal in size with greater than 50% respiratory variability, suggesting right atrial pressure of 3 mmHg. IAS/Shunts: No atrial level shunt detected by color flow Doppler.  LEFT VENTRICLE PLAX 2D LVIDd:         4.70 cm   Diastology LVIDs:         2.70 cm   LV e' medial:    11.00 cm/s LV PW:         1.30 cm   LV E/e' medial:  12.2 LV IVS:        0.80 cm   LV e' lateral:   10.30 cm/s LVOT diam:     1.90 cm   LV E/e' lateral: 13.1 LV SV:         89 LV SV Index:   51 LVOT Area:     2.84 cm  RIGHT VENTRICLE RV Basal diam:  3.30 cm RV S prime:     14.90 cm/s LEFT ATRIUM             Index        RIGHT ATRIUM           Index LA diam:        4.40 cm 2.53 cm/m   RA Area:     13.20 cm LA Vol (A2C):   95.7 ml 54.94 ml/m  RA Volume:   26.90 ml  15.44 ml/m LA Vol (A4C):   65.6 ml 37.66 ml/m LA  Biplane Vol: 81.1 ml 46.56 ml/m  AORTIC VALVE                     PULMONIC VALVE AV Area (Vmax):    2.16 cm      PV Vmax:       1.19 m/s AV Area (Vmean):   2.16 cm      PV Vmean:      77.100 cm/s AV Area (VTI):     2.22 cm      PV VTI:        0.226 m AV Vmax:           184.00 cm/s   PV Peak grad:  5.7 mmHg AV Vmean:          131.000 cm/s  PV Mean grad:  3.0 mmHg AV VTI:            0.399 m AV Peak Grad:      13.5 mmHg AV Mean Grad:      8.0 mmHg LVOT Vmax:         140.00 cm/s LVOT Vmean:        99.800 cm/s LVOT VTI:          0.313 m LVOT/AV VTI ratio: 0.78  AORTA Ao Root diam: 3.00 cm MITRAL VALVE MV Area (PHT): 3.56 cm     SHUNTS MV Area VTI:   2.58 cm     Systemic VTI:  0.31 m MV Peak grad:  7.2 mmHg     Systemic Diam: 1.90 cm MV Mean grad:  3.0 mmHg MV Vmax:       1.34 m/s MV Vmean:      71.7 cm/s MV Decel Time: 213 msec MV E velocity: 134.50 cm/s Ida Rogue MD Electronically signed by Ida Rogue MD Signature  Date/Time: 08/01/2021/12:41:40 PM    Final      Medications:     apixaban  2.5 mg Oral BID   ferrous sulfate  325 mg Oral QODAY   levothyroxine  50 mcg Oral Q0600   predniSONE  20 mg Oral Q breakfast   acetaminophen **OR** acetaminophen, ondansetron **OR** ondansetron (ZOFRAN) IV, polyvinyl alcohol  Assessment/ Plan:  Penny White is a 86 y.o.  female with a past medical history of lymphoma, CAD, recurrent TIA, polymyalgia rheumatic on chronic prednisone, hypothyroidism, atrial fibrillation on Eliquis, heart failure with preserved EF, and chronic kidney disease stage IV with anemia.  Patient has been admitted for Elevated troponin [R77.8]   Acute Kidney Injury with hypokalemia on chronic kidney disease stage 4 with baseline creatinine 2.03 and GFR of 24 on 07/23/21.  Acute kidney injury secondary to suspected over diuresis No exposure to IV contrast.  Agree with losartan and torsemide be held.   -Creatinine down to 2.1 with an EGFR of 23.  Reasonable urine output noted.  Patient now off of IV fluid hydration.  Lab Results  Component Value Date   CREATININE 2.11 (H) 08/02/2021   CREATININE 2.43 (H) 08/01/2021   CREATININE 2.74 (H) 07/31/2021    Intake/Output Summary (Last 24 hours) at 08/02/2021 1100 Last data filed at 08/02/2021 0950 Gross per 24 hour  Intake 1077 ml  Output 1650 ml  Net -573 ml    2. Anemia of chronic kidney disease Lab Results  Component Value Date   HGB 8.1 (L) 08/02/2021  Hemoglobin currently 8.1.  Suspect that she will need Epogen as an outpatient but defer to hematology as she has underlying lymphoma.  3. Secondary Hyperparathyroidism: with outpatient labs: PTH 105, phosphorus 3.2, calcium 8.5 on 05/29/21.   Lab Results  Component Value Date   CALCIUM 8.0 (L) 08/02/2021  No immediate need for calcitriol but should monitor bone metabolism parameters as an outpatient.  4.  Hypertension with chronic kidney disease.  Home regimen includes  losartan and torsemide.  Torsemide recently increased due to increased edema.  Both held at this time.  Blood pressure up to 127/29.   LOS: 2 Kathalene Sporer 11/11/202211:00 AM

## 2021-08-02 NOTE — Progress Notes (Signed)
Physical Therapy Treatment Patient Details Name: Penny White MRN: [id number] DOB: Nov 24, 1934 Today's Date: 08/02/2021   History of Present Illness presented to ER sconday to generalized weakness, fatigue and hypotension; admitted for management of generalized weakness, AKI.  Noted with mild elevation in troponin (peak 144), demand ischemia per notes.    PT Comments    Pt resting in bed upon PT arrival; agreeable to PT session.  Semi-supine to sitting edge of bed min asisst for trunk; CGA with transfers; and CGA ambulating 40 feet with RW (pt requiring cueing for direction d/t impaired peripheral vision).   Limited distance ambulating d/t pt fatigue and generalized weakness.  Pt would continue to benefit from SNF but may be safe to discharge back to [facility] with increased assistance with ADL's, 24/7 assist with functional mobility for safety; and use of w/c for longer distances.    Recommendations for follow up therapy are one component of a multi-disciplinary discharge planning process, led by the attending physician.  Recommendations may be updated based on patient status, additional functional criteria and insurance authorization.  Follow Up Recommendations  Skilled nursing-short term rehab (<3 hours/day)     Assistance Recommended at Discharge Frequent or constant Supervision/Assistance  Equipment Recommendations  Rolling walker (2 wheels)    Recommendations for Other Services       Precautions / Restrictions Precautions Precautions: Fall Restrictions Weight Bearing Restrictions: No     Mobility  Bed Mobility Overal bed mobility: Needs Assistance Bed Mobility: Supine to Sit;Sit to Supine     Supine to sit: Min assist;HOB elevated (assist for trunk) Sit to supine: Supervision   General bed mobility comments: vc's for technique    Transfers Overall transfer level: Needs assistance Equipment used: Rolling walker (2 wheels) Transfers: Sit to/from Stand Sit to  Stand: Min guard           General transfer comment: x2 trials standing from bed; increased effort to stand on own noted    Ambulation/Gait Ambulation/Gait assistance: Min guard Gait Distance (Feet): 40 Feet Assistive device: Rolling walker (2 wheels)   Gait velocity: decreased     General Gait Details: decreased B LE step length/foot clearance   Stairs             Wheelchair Mobility    Modified Rankin (Stroke Patients Only)       Balance Overall balance assessment: Needs assistance Sitting-balance support: No upper extremity supported;Feet supported Sitting balance-Leahy Scale: Good Sitting balance - Comments: steady sitting reaching within BOS   Standing balance support: Bilateral upper extremity supported;During functional activity Standing balance-Leahy Scale: Good Standing balance comment: no loss of balance with ambulation using RW                            Cognition Arousal/Alertness: Awake/alert Behavior During Therapy: WFL for tasks assessed/performed Overall Cognitive Status: Within Functional Limits for tasks assessed                                          Exercises      General Comments  Nursing cleared pt for participation in physical therapy.  Pt agreeable to PT session.      Pertinent Vitals/Pain Pain Assessment: No/denies pain HR 52-86 bpm during sessions activities.    Home Living  Prior Function            PT Goals (current goals can now be found in the care plan section) Acute Rehab PT Goals Patient Stated Goal: to get stronger PT Goal Formulation: With patient Time For Goal Achievement: 08/15/21 Potential to Achieve Goals: Good Progress towards PT goals: Progressing toward goals    Frequency    Min 2X/week      PT Plan Current plan remains appropriate    Co-evaluation              AM-PAC PT "6 Clicks" Mobility   Outcome Measure  Help  needed turning from your back to your side while in a flat bed without using bedrails?: A Little Help needed moving from lying on your back to sitting on the side of a flat bed without using bedrails?: A Little Help needed moving to and from a bed to a chair (including a wheelchair)?: A Little Help needed standing up from a chair using your arms (e.g., wheelchair or bedside chair)?: A Little Help needed to walk in hospital room?: A Little Help needed climbing 3-5 steps with a railing? : A Lot 6 Click Score: 17    End of Session Equipment Utilized During Treatment: Gait belt Activity Tolerance: Patient limited by fatigue Patient left: in bed;with call bell/phone within reach;with bed alarm set Nurse Communication: Mobility status;Precautions PT Visit Diagnosis: Muscle weakness (generalized) (M62.81);Difficulty in walking, not elsewhere classified (R26.2)     Time: 0964-3838 PT Time Calculation (min) (ACUTE ONLY): 32 min  Charges:  $Gait Training: 8-22 mins $Therapeutic Activity: 8-22 mins                    Leitha Bleak, PT 08/02/21, 5:01 PM

## 2021-08-02 NOTE — Progress Notes (Signed)
Progress Note  Patient Name: Penny White Date of Encounter: 08/02/2021  Primary Cardiologist: Ida Rogue, MD  Subjective   She feels good this morning, but states she is still weak compared to her baseline. She refused to walk with PT in the hallway yesterday but did walk in the room. She denies chest pain, palpitations, dyspnea, pnd, orthopnea, n, v, dizziness, syncope, edema, weight gain, or early satiety.   Inpatient Medications    Scheduled Meds:  apixaban  2.5 mg Oral BID   ferrous sulfate  325 mg Oral QODAY   levothyroxine  50 mcg Oral Q0600   predniSONE  20 mg Oral Q breakfast   Continuous Infusions:  PRN Meds: acetaminophen **OR** acetaminophen, ondansetron **OR** ondansetron (ZOFRAN) IV, polyvinyl alcohol   Vital Signs    Vitals:   08/01/21 2015 08/02/21 0000 08/02/21 0406 08/02/21 0748  BP: (!) 113/53 (!) 137/54 (!) 144/42 (!) 127/29  Pulse: 79   (!) 52  Resp: 17 18  18   Temp: 97.8 F (36.6 C) 98.7 F (37.1 C) 98.4 F (36.9 C) 98.2 F (36.8 C)  TempSrc:      SpO2: 98% 99% 98% 91%  Weight:      Height:        Intake/Output Summary (Last 24 hours) at 08/02/2021 0836 Last data filed at 08/02/2021 0512 Gross per 24 hour  Intake 1077 ml  Output 1850 ml  Net -773 ml   Filed Weights   07/31/21 1548 08/01/21 0344  Weight: 75.8 kg 72.9 kg    Physical Exam   GEN:  well developed, in no acute distress.  HEENT: Grossly normal.  Neck: Supple, no JVD, carotid bruits, or masses. Cardiac:  irregularly irregular, no murmurs, rubs, or gallops. No clubbing, cyanosis, edema.  Radials 2+, DP/PT 2+ and equal bilaterally.  Respiratory:  Respirations regular and unlabored, clear to auscultation bilaterally. GI: Soft, nontender, nondistended, BS + x 4. MS: no deformity or atrophy. Skin: warm and dry, no rash. Neuro:  Strength and sensation are intact. Psych: AAOx3.  Normal affect.  Labs    Chemistry Recent Labs  Lab 07/31/21 1713 08/01/21 0350  08/02/21 0448  NA 136 138 139  K 3.6 2.5* 5.1  CL 96* 104 109  CO2 26 25 22   GLUCOSE 114* 86 122*  BUN 47* 47* 43*  CREATININE 2.74* 2.43* 2.11*  CALCIUM 8.5* 7.5* 8.0*  PROT 5.7* 5.2* 5.2*  ALBUMIN 2.4* 2.4* 2.4*  AST 191* 178* 148*  ALT 133* 124* 134*  ALKPHOS 63 57 56  BILITOT 0.8 0.9 0.9  GFRNONAA 17* 19* 23*  ANIONGAP 14 9 8      Hematology Recent Labs  Lab 07/31/21 1713 08/01/21 0350 08/01/21 0553 08/02/21 0440  WBC 4.1 3.1*  --  4.1  RBC 3.25* 2.96* 3.02* 2.92*  HGB 8.9* 7.9*  --  8.1*  HCT 29.4* 26.6*  --  27.4*  MCV 90.5 89.9  --  93.8  MCH 27.4 26.7  --  27.7  MCHC 30.3 29.7*  --  29.6*  RDW 16.2* 16.1*  --  16.3*  PLT 192 164  --  178    Cardiac Enzymes  Recent Labs  Lab 07/31/21 1713 07/31/21 2004  TROPONINIHS 144* 139*      BNP Recent Labs  Lab 07/31/21 1713  BNP 274.2*    Radiology    DG Chest Portable 1 View  Result Date: 07/31/2021 CLINICAL DATA:  Weakness over the last 2 days.  Question pneumonia. EXAM:  PORTABLE CHEST 1 VIEW COMPARISON:  None. FINDINGS: Heart size is normal. Tortuosity of the aorta. The lungs are clear. Evidence of infiltrate, collapse or effusion. No sign of heart failure. No acute bone finding. IMPRESSION: Tortuosity of the aorta. No active disease evident. No sign of pneumonia. Electronically Signed   By: Nelson Chimes M.D.   On: 07/31/2021 17:00   Telemetry    Atrial Fibrillation, HR in the 50s.- Personally Reviewed  ECG     11/9- Atrial Fibrillation, HR 59. Poor R wave progression. Left axis devation- Personally Reviewed  Cardiac Studies   2D Echocardiogram 11.10.2022  1. Left ventricular ejection fraction, by estimation, is 60 to 65%. The  left ventricle has normal function. The left ventricle has no regional  wall motion abnormalities. Left ventricular diastolic parameters are  indeterminate.   2. Right ventricular systolic function is normal. The right ventricular  size is normal. Tricuspid  regurgitation signal is inadequate for assessing  PA pressure.   3. Left atrial size was mildly dilated.   4. The mitral valve is normal in structure. Mild mitral valve  regurgitation. No evidence of mitral stenosis.   5. The aortic valve is normal in structure. Aortic valve regurgitation is  mild. Mild aortic valve sclerosis is present, with no evidence of aortic  valve stenosis.   6. There is borderline dilatation of the ascending aorta, measuring 36  mm.   7. The inferior vena cava is normal in size with greater than 50%  respiratory variability, suggesting right atrial pressure of 3 mmHg.   Patient Profile     86 y.o. female with history of permanent a. Fib, HFpEF, hypertension, HLD, hypothyroid, prior stroke, marginal zone lymphoma, subdural hematoma, polymyalgia rheumatica, MGUS, nonobstructive CAD, and stage IV CKD, being seen for elevated troponin.   Assessment & Plan    1. Weakness/ Dehydration: Patient presented with 1-2 week history of progressive weakness and fatigue. Was found to have elevated creatinine (2.74) above baseline (2.03). Blood pressure were soft in the 90s. Her torsemide dose was previously doubled on November 1 in setting of lower extremity edema by her nephrologist, which improved. Torsemide currently on hold. Admission weight of 72.9 kg is roughly 3 kg below previous dry weight. Creatinine has improved after rehydration to 2.11. Patient still feels weaker than baseline but, weakness has improved since admission. PT consulted. Repeat renal panel in am. Will defer restarting torsemide at this time d/t weakness but suspect she will need 10mg  daily @ discharge with an additional 5-10mg  daily prn wt gain of 2-3 lbs.  2. Elevated troponin/ demand ischemia/ nonobstructive CAD: Initial troponin elevated at 144 with repeat of 139 in the setting of AKI. Patient denies chest pain or SOB. She had previous abnormal stress test in 2019 with catheretization revealing nonobstructive  CAD. Echocardiogram this admission shows normal LV function without regional wall abnormalities. Deferring ischemic evaluation at this time in the setting of normal LV fxn by echo and absence of chest pain. ASA therapy not indicated in the setting of Eliquis anticoagulation. Defer beta-blocker therapy in the setting of bradycardia. Home statin dose on hold secondary to elevated LFTs.     3. Chronic Heart Failure with Preserved Ejection Fraction: Patient with recent lower extremity edema prompting doubling of home torsemide dose. She noted significant improvement in swelling with higher dose, but noted to have progressive weakness and fatigue. Torsemide currently on hold d/t dehydration. Home losartan dose on hold secondary to soft blood pressures. Patient is still feeling  weak this AM, will look to restart torsemide tomorrow.  Cont to hold losartan, which can be resumed as outpt.  4. Permanent atrial fibrillation: HR well controlled without AV node blocking agents. Anticoagulated with eliquis, stable H/H.   5. CKD IV: Creatinine improved to 2.11. If creatinine remains stable will look to restart torsemide prior to discharge. Repeat renal panel in am.   6. Elevated LFTs: LFTs elevated dating back to September. Crestor on hold. Plan to repeat LFTs outpatient.   7. HLD: Statin on hold secondary to elevated LFTs.  8. Hypokalemia: Potassium of 2.5 on 10/10. Repeat Potassium this AM of 5.1 after PO and IV supplementation.   9. Anemia of Chronic Disease: HBG stable at 8.1 in the setting of Stage IV CKD and small B-cell lymphoma. No signs of bleeding in setting of eliquis therapy for A. Fib. Recently started on iron supplement by oncology.   10. CVA: No focal deficits. Anticoagulated with eliquis. Statin on hold d/t elevated LFTs.    Signed, Murray Hodgkins, NP  08/02/2021, 8:36 AM    For questions or updates, please contact   Please consult [email or web address] for contact info under Cardiology/STEMI.

## 2021-08-02 NOTE — TOC Initial Note (Signed)
Transition of Care [facility]) - Initial/Assessment Note    Patient Details  Name: Penny White MRN: [id number] Date of Birth: 20-Jan-1935  [facility] CM/SW Contact:    Eileen Stanford, LCSW Phone Number: 08/02/2021, 10:30 AM  Clinical Narrative:    CSW spoke with pt and explained SNF recommendation. Pt states she was getting therapy at [facility] and would prefer to return there and continue that therapy. CSW has reached out to [employer]. They will review referral--referral faxed.               Expected Discharge Plan: [place] Barriers to Discharge: Continued Medical Work up   Patient Goals and CMS Choice Patient states their goals for this hospitalization and ongoing recovery are:: to return to ALF   Choice offered to / list presented to : Patient  Expected Discharge Plan and Services Expected Discharge Plan: [place] In-house Referral: NA     Living arrangements for the past 2 months: [place]                                      Prior Living Arrangements/Services Living arrangements for the past 2 months: [place]   Patient language and need for interpreter reviewed:: Yes Do you feel safe going back to the place where you live?: Yes      Need for Family Participation in Patient Care: Yes (Comment) Care giver support system in place?: Yes (comment)   Criminal Activity/Legal Involvement Pertinent to Current Situation/Hospitalization: No - Comment as needed  Activities of Daily Living Home Assistive Devices/Equipment: Eyeglasses, Gilford Rile (specify type) ADL Screening (condition at time of admission) Patient's cognitive ability adequate to safely complete daily activities?: Yes Is the patient deaf or have difficulty hearing?: No Does the patient have difficulty seeing, even when wearing glasses/contacts?: No Does the patient have difficulty concentrating, remembering, or making  decisions?: No Patient able to express need for assistance with ADLs?: Yes Does the patient have difficulty dressing or bathing?: Yes Independently performs ADLs?: Yes (appropriate for developmental age) Does the patient have difficulty walking or climbing stairs?: Yes Weakness of Legs: None Weakness of Arms/Hands: None  Permission Sought/Granted Permission sought to share information with : Family Supports Permission granted to share information with : Yes, Release of Information Signed  Share Information with NAME: thomas  Permission granted to share info w AGENCY: home place  Permission granted to share info w Relationship: son     Emotional Assessment Appearance:: Appears stated age Attitude/Demeanor/Rapport: Engaged Affect (typically observed): Accepting Orientation: : Oriented to Self, Oriented to Place, Oriented to  Time, Oriented to Situation Alcohol / Substance Use: Not Applicable Psych Involvement: No (comment)  Admission diagnosis:  Elevated troponin [R77.8] Patient Active Problem List   Diagnosis Date Noted   Hypotension    Elevated troponin 07/31/2021   Essential hypertension 07/31/2021   Chronic anticoagulation 07/31/2021   Acute kidney injury superimposed on CKD IV ([place]) 07/31/2021   Elevated LFTs 07/31/2021   Generalized weakness 07/31/2021   Polymyalgia rheumatica ([place]) 07/31/2021   Coronary artery disease    Small cell B-cell lymphoma, unspecified site [facility])    Hypothyroid    Current chronic use of systemic steroids    Elevated lactic acid level    UTI (urinary tract infection)    Bradycardia    Lactic acidosis  Anemia in chronic kidney disease 07/16/2020   Chronic kidney disease, stage IV (severe) ([place]) 06/18/2020   Chronic atrial fibrillation ([facility]) 01/22/2017   Personal history of transient ischemic attack (TIA), and cerebral infarction without residual deficits 09/27/2016   Heart failure with preserved left ventricular function (HFpEF) ([place])  06/25/2015   MGUS (monoclonal gammopathy of unknown significance) 05/07/2012   PCP:  Leonel Ramsay, MD Pharmacy:   [id number] [address], [place] [address] Phone: [phone] Fax: [phone]     Social Determinants of Health (SDOH) Interventions    Readmission Risk Interventions No flowsheet data found.

## 2021-08-02 NOTE — Plan of Care (Signed)
  Problem: Education: Goal: Knowledge of General Education information will improve Description: Including pain rating scale, medication(s)/side effects and non-pharmacologic comfort measures 08/02/2021 1137 by Cristela Blue, RN Outcome: Progressing 08/02/2021 1136 by Cristela Blue, RN Outcome: Progressing   Problem: Health Behavior/Discharge Planning: Goal: Ability to manage health-related needs will improve 08/02/2021 1137 by Cristela Blue, RN Outcome: Progressing 08/02/2021 1136 by Cristela Blue, RN Outcome: Progressing   Problem: Clinical Measurements: Goal: Ability to maintain clinical measurements within normal limits will improve 08/02/2021 1137 by Cristela Blue, RN Outcome: Progressing 08/02/2021 1136 by Cristela Blue, RN Outcome: Progressing Goal: Will remain free from infection 08/02/2021 1137 by Cristela Blue, RN Outcome: Progressing 08/02/2021 1136 by Cristela Blue, RN Outcome: Progressing Goal: Diagnostic test results will improve 08/02/2021 1137 by Cristela Blue, RN Outcome: Progressing 08/02/2021 1136 by Cristela Blue, RN Outcome: Progressing Goal: Respiratory complications will improve 08/02/2021 1137 by Cristela Blue, RN Outcome: Progressing 08/02/2021 1136 by Cristela Blue, RN Outcome: Progressing Goal: Cardiovascular complication will be avoided 08/02/2021 1137 by Cristela Blue, RN Outcome: Progressing 08/02/2021 1136 by Cristela Blue, RN Outcome: Progressing   Problem: Activity: Goal: Risk for activity intolerance will decrease 08/02/2021 1137 by Cristela Blue, RN Outcome: Progressing 08/02/2021 1136 by Cristela Blue, RN Outcome: Progressing   Problem: Nutrition: Goal: Adequate nutrition will be maintained 08/02/2021 1137 by Cristela Blue, RN Outcome: Progressing 08/02/2021 1136 by Cristela Blue, RN Outcome: Progressing   Problem: Coping: Goal: Level of anxiety will decrease 08/02/2021 1137 by Cristela Blue, RN Outcome:  Progressing 08/02/2021 1136 by Cristela Blue, RN Outcome: Progressing   Problem: Elimination: Goal: Will not experience complications related to bowel motility 08/02/2021 1137 by Cristela Blue, RN Outcome: Progressing 08/02/2021 1136 by Cristela Blue, RN Outcome: Progressing Goal: Will not experience complications related to urinary retention 08/02/2021 1137 by Cristela Blue, RN Outcome: Progressing 08/02/2021 1136 by Cristela Blue, RN Outcome: Progressing   Problem: Pain Managment: Goal: General experience of comfort will improve 08/02/2021 1137 by Cristela Blue, RN Outcome: Progressing 08/02/2021 1136 by Cristela Blue, RN Outcome: Progressing   Problem: Safety: Goal: Ability to remain free from injury will improve 08/02/2021 1137 by Cristela Blue, RN Outcome: Progressing 08/02/2021 1136 by Cristela Blue, RN Outcome: Progressing   Problem: Skin Integrity: Goal: Risk for impaired skin integrity will decrease 08/02/2021 1137 by Cristela Blue, RN Outcome: Progressing 08/02/2021 1136 by Cristela Blue, RN Outcome: Progressing   Problem: Education: Goal: Ability to demonstrate management of disease process will improve 08/02/2021 1137 by Cristela Blue, RN Outcome: Progressing 08/02/2021 1136 by Cristela Blue, RN Outcome: Progressing Goal: Ability to verbalize understanding of medication therapies will improve 08/02/2021 1137 by Cristela Blue, RN Outcome: Progressing 08/02/2021 1136 by Cristela Blue, RN Outcome: Progressing Goal: Individualized Educational Video(s) 08/02/2021 1137 by Cristela Blue, RN Outcome: Progressing 08/02/2021 1136 by Cristela Blue, RN Outcome: Progressing   Problem: Activity: Goal: Capacity to carry out activities will improve 08/02/2021 1137 by Cristela Blue, RN Outcome: Progressing 08/02/2021 1136 by Cristela Blue, RN Outcome: Progressing   Problem: Cardiac: Goal: Ability to achieve and maintain adequate  cardiopulmonary perfusion will improve 08/02/2021 1137 by Cristela Blue, RN Outcome: Progressing 08/02/2021 1136 by Cristela Blue, RN Outcome: Progressing

## 2021-08-02 NOTE — Plan of Care (Signed)
  Problem: Education: Goal: Knowledge of General Education information will improve Description Including pain rating scale, medication(s)/side effects and non-pharmacologic comfort measures Outcome: Progressing   Problem: Activity: Goal: Risk for activity intolerance will decrease Outcome: Progressing   Problem: Safety: Goal: Ability to remain free from injury will improve Outcome: Progressing   

## 2021-08-02 NOTE — Progress Notes (Signed)
PROGRESS NOTE  Penny White    DOB: 1935-03-17, 86 y.o.  NID:[id number]  PCP: Leonel Ramsay, MD   Code Status: DNR   DOA: 07/31/2021   LOS: 2  Brief Narrative of Current Hospitalization  Penny White is a 86 y.o. female with a PMH significant for polymyalgia rheumatica on chronic prednisone, hypothyroidism, history of lymphoma, CAD, TIA, HFpEF, HTN, A. fib on Eliquis, anemia of chronic disease, CKDIV. They presented from ALF to the ED on 07/31/2021 with generalized weakness, fatigue x 14 days. In the ED, it was found that they had several laboratory abnormalities as outlined below.. They were treated with IV fluid hydration, holding of home antihypertensives, Rocephin.  Cardiology was consulted.  Patient was admitted to medicine service for further workup and management of multiple comorbidities as outlined in detail below.  08/02/21 -stable, improved  Assessment & Plan  Principal Problem:   Generalized weakness Active Problems:   Elevated troponin   Chronic atrial fibrillation (HCC)   Essential hypertension   Heart failure with preserved left ventricular function (HFpEF) (HCC)   Personal history of transient ischemic attack (TIA), and cerebral infarction without residual deficits   Anemia in chronic kidney disease   Chronic anticoagulation   Acute kidney injury superimposed on CKD IV (HCC)   Elevated LFTs   Coronary artery disease   Small cell B-cell lymphoma, unspecified site (HCC)   Hypothyroid   Polymyalgia rheumatica (HCC)   Current chronic use of systemic steroids   Elevated lactic acid level   UTI (urinary tract infection)   Bradycardia   Lactic acidosis   Hypotension  Hypotension  hypovolemia-thought to be related to poor p.o. intake and increasing her diuretic medication.  Patient feels improved today. She is asymptomatic at rest. Blood pressures have remained soft despite holding her home antihypertensives. Monitor for improvement with gentle hydration  given h/o HF. If not improving significantly, she has several other confounding factors that can contribute to her presenting generalized fatigue as discussed below.  - PT/OT evaluation incredibly important in helping to determine patients disposition. Her facility does have higher care available and she is good prognosis to improve.  - orthostatic vitals- repeat today since the HR was not recorded yesterday so unable to assess the results meaningfully.  - strict I/O - daily weights - continue PO hydration - cardiology has been consulted, appreciate recs  AKI on CKD II  hypokalemia (resolved)- improving. Baseline creatinine around 1.7.  2.03>2.74 >2.11 this admission. K+ 5.1 -Continue PO hydration - hold losartan (given 11/10) - CMP am  Normocytic anemia-  hgb 8.1 today - CBC am. Transfusion threshold 8 due to CAD. - continue home iron  CAD  HTN  Afib  HFpEF- chest pain free, rate controlled. Low/normal Bps. Euvolemic on exam but limited by body habitus. EF 60-65% with overall normal function. Of note, borderline dilation of ascending aorta- 62mm - continue eliquis - holding torsemide for hypovolemia  Transaminitis- elevated AST/ALT. Potentially from hypovolemia- stable/trending down - CMP am  Polymyalgia rheumatica- presenting symptoms potentially related. CK elevated 3,525 yesterday.  - continue increase home prednisone dose - trend CK  H/o CVA- without long term deficits. Discontinuing statin at this time due to elevated CK and potential from medication side effect. Can discuss restarting with PCP after discharge and recovery.   Small cell B-cell lymphoma- recurrence found 04/2021. Last seen by oncology 11/1 and patient declined initiating treatment at that time so Dr Janese Banks recommended continued monitoring of her lymphoma with  a repeat CBC with differential CMP LDH in 6 weeks and 12 weeks and I will see her back in 12 weeks. Potentially this is contributing to her weakened state.   - FYI heme/onc  Hypothyroid- TSH normal on admission. Not contributing to presentation - continue home levothyroxine  DVT prophylaxis: apixaban (ELIQUIS) tablet 2.5 mg Start: 08/01/21 0015 apixaban (ELIQUIS) tablet 2.5 mg   Diet:  Diet Orders (From admission, onward)     Start     Ordered   07/31/21 2316  Diet Heart Room service appropriate? Yes; Fluid consistency: Thin  Diet effective now       Question Answer Comment  Room service appropriate? Yes   Fluid consistency: Thin      07/31/21 2321            Subjective 08/02/21    Pt reports doing better today minus still feeling mild overall tiredness. Describes difficulty in getting up from bed and around vs her baseline of being ambulatory and mostly independent for ADLs.  Disposition Plan & Communication  Patient status: Inpatient  Admitted From: SNF Disposition: Skilled nursing facility Anticipated discharge date: 11/12  Family Communication: none at bedside  Consults, Procedures, Significant Events  Consultants:  Cardiology Nephrology Heme/once FYI  Procedures/significant events:  none Antimicrobials:  Anti-infectives (From admission, onward)    Start     Dose/Rate Route Frequency Ordered Stop   07/31/21 2200  cefTRIAXone (ROCEPHIN) 1 g in sodium chloride 0.9 % 100 mL IVPB        1 g 200 mL/hr over 30 Minutes Intravenous  Once 07/31/21 2157 08/01/21 0139       Objective   Vitals:   08/01/21 1510 08/01/21 2015 08/02/21 0000 08/02/21 0406  BP: (!) 111/54 (!) 113/53 (!) 137/54 (!) 144/42  Pulse: 60 79    Resp: 18 17 18    Temp: 97.8 F (36.6 C) 97.8 F (36.6 C) 98.7 F (37.1 C) 98.4 F (36.9 C)  TempSrc: Oral     SpO2: 99% 98% 99% 98%  Weight:      Height:        Intake/Output Summary (Last 24 hours) at 08/02/2021 0735 Last data filed at 08/02/2021 0512 Gross per 24 hour  Intake 1077 ml  Output 2500 ml  Net -1423 ml    Filed Weights   07/31/21 1548 08/01/21 0344  Weight: 75.8 kg 72.9 kg     Patient BMI: Body mass index is 29.4 kg/m.   Physical Exam: General: awake, alert, NAD HEENT: atraumatic, clear conjunctiva, anicteric sclera, moist mucus membranes, hearing grossly normal Respiratory: normal respiratory effort. Cardiovascular: normal S1/S2,  RRR, no JVD, murmurs, rubs, gallops, quick capillary refill  Gastrointestinal: soft, NT, ND, no HSM felt Nervous: A&O x3. no gross focal neurologic deficits, normal speech Extremities: moves all equally, no edema, normal tone Skin: dry, intact, normal temperature, normal color, No rashes, lesions or ulcers Psychiatry: normal mood, congruent affect  Labs   I have personally reviewed following labs and imaging studies Admission on 07/31/2021  Component Date Value Ref Range Status   Sodium 07/31/2021 136  135 - 145 mmol/L Final   Potassium 07/31/2021 3.6  3.5 - 5.1 mmol/L Final   Chloride 07/31/2021 96 (A)  98 - 111 mmol/L Final   CO2 07/31/2021 26  22 - 32 mmol/L Final   Glucose, Bld 07/31/2021 114 (A)  70 - 99 mg/dL Final   BUN 07/31/2021 47 (A)  8 - 23 mg/dL Final   Creatinine, Ser 07/31/2021  2.74 (A)  0.44 - 1.00 mg/dL Final   Calcium 07/31/2021 8.5 (A)  8.9 - 10.3 mg/dL Final   Total Protein 07/31/2021 5.7 (A)  6.5 - 8.1 g/dL Final   Albumin 07/31/2021 2.4 (A)  3.5 - 5.0 g/dL Final   AST 07/31/2021 191 (A)  15 - 41 U/L Final   ALT 07/31/2021 133 (A)  0 - 44 U/L Final   Alkaline Phosphatase 07/31/2021 63  38 - 126 U/L Final   Total Bilirubin 07/31/2021 0.8  0.3 - 1.2 mg/dL Final   GFR, Estimated 07/31/2021 17 (A)  >60 mL/min Final   Anion gap 07/31/2021 14  5 - 15 Final   WBC 07/31/2021 4.1  4.0 - 10.5 K/uL Final   RBC 07/31/2021 3.25 (A)  3.87 - 5.11 MIL/uL Final   Hemoglobin 07/31/2021 8.9 (A)  12.0 - 15.0 g/dL Final   HCT 07/31/2021 29.4 (A)  36.0 - 46.0 % Final   MCV 07/31/2021 90.5  80.0 - 100.0 fL Final   MCH 07/31/2021 27.4  26.0 - 34.0 pg Final   MCHC 07/31/2021 30.3  30.0 - 36.0 g/dL Final   RDW 07/31/2021  16.2 (A)  11.5 - 15.5 % Final   Platelets 07/31/2021 192  150 - 400 K/uL Final   nRBC 07/31/2021 0.0  0.0 - 0.2 % Final   Neutrophils Relative % 07/31/2021 78  % Final   Neutro Abs 07/31/2021 3.2  1.7 - 7.7 K/uL Final   Lymphocytes Relative 07/31/2021 7  % Final   Lymphs Abs 07/31/2021 0.3 (A)  0.7 - 4.0 K/uL Final   Monocytes Relative 07/31/2021 11  % Final   Monocytes Absolute 07/31/2021 0.5  0.1 - 1.0 K/uL Final   Eosinophils Relative 07/31/2021 1  % Final   Eosinophils Absolute 07/31/2021 0.0  0.0 - 0.5 K/uL Final   Basophils Relative 07/31/2021 2  % Final   Basophils Absolute 07/31/2021 0.1  0.0 - 0.1 K/uL Final   Immature Granulocytes 07/31/2021 1  % Final   Abs Immature Granulocytes 07/31/2021 0.05  0.00 - 0.07 K/uL Final   Troponin I (High Sensitivity) 07/31/2021 144 (A)  <18 ng/L Final   Lactic Acid, Venous 07/31/2021 2.4 (A)  0.5 - 1.9 mmol/L Final   Lactic Acid, Venous 07/31/2021 1.3  0.5 - 1.9 mmol/L Final   Color, Urine 07/31/2021 STRAW (A)  YELLOW Final   APPearance 07/31/2021 CLEAR (A)  CLEAR Final   Specific Gravity, Urine 07/31/2021 1.006  1.005 - 1.030 Final   pH 07/31/2021 6.0  5.0 - 8.0 Final   Glucose, UA 07/31/2021 NEGATIVE  NEGATIVE mg/dL Final   Hgb urine dipstick 07/31/2021 LARGE (A)  NEGATIVE Final   Bilirubin Urine 07/31/2021 NEGATIVE  NEGATIVE Final   Ketones, ur 07/31/2021 NEGATIVE  NEGATIVE mg/dL Final   Protein, ur 07/31/2021 30 (A)  NEGATIVE mg/dL Final   Nitrite 07/31/2021 NEGATIVE  NEGATIVE Final   Leukocytes,Ua 07/31/2021 SMALL (A)  NEGATIVE Final   WBC, UA 07/31/2021 0-5  0 - 5 WBC/hpf Final   Bacteria, UA 07/31/2021 RARE (A)  NONE SEEN Final   Squamous Epithelial / LPF 07/31/2021 0-5  0 - 5 Final   B Natriuretic Peptide 07/31/2021 274.2 (A)  0.0 - 100.0 pg/mL Final   SARS Coronavirus 2 by RT PCR 07/31/2021 NEGATIVE  NEGATIVE Final   Influenza A by PCR 07/31/2021 NEGATIVE  NEGATIVE Final   Influenza B by PCR 07/31/2021 NEGATIVE  NEGATIVE Final    Troponin I (High Sensitivity)  07/31/2021 139 (A)  <18 ng/L Final   TSH 07/31/2021 1.066  0.350 - 4.500 uIU/mL Final   Specimen Description 07/31/2021 BLOOD BLOOD LEFT HAND   Final   Special Requests 07/31/2021 BOTTLES DRAWN AEROBIC AND ANAEROBIC Blood Culture results may not be optimal due to an inadequate volume of blood received in culture bottles   Final   Culture 07/31/2021    Final                   Value:NO GROWTH 2 DAYS Performed at [facility], [address] [place], [place] [address]    Report Status 07/31/2021 PENDING   Incomplete   Specimen Description 07/31/2021 BLOOD LEFT HAND   Final   Special Requests 07/31/2021 BOTTLES DRAWN AEROBIC AND ANAEROBIC Blood Culture results may not be optimal due to an inadequate volume of blood received in culture bottles   Final   Culture 07/31/2021    Final                   Value:NO GROWTH 1 DAY Performed at [facility], [address], [place], [place] [address]    Report Status 07/31/2021 PENDING   Incomplete   MRSA by PCR Next Gen 07/31/2021 NOT DETECTED  NOT DETECTED Final   Sodium 08/01/2021 138  135 - 145 mmol/L Final   Potassium 08/01/2021 2.5 (A)  3.5 - 5.1 mmol/L Final   Chloride 08/01/2021 104  98 - 111 mmol/L Final   CO2 08/01/2021 25  22 - 32 mmol/L Final   Glucose, Bld 08/01/2021 86  70 - 99 mg/dL Final   BUN 08/01/2021 47 (A)  8 - 23 mg/dL Final   Creatinine, Ser 08/01/2021 2.43 (A)  0.44 - 1.00 mg/dL Final   Calcium 08/01/2021 7.5 (A)  8.9 - 10.3 mg/dL Final   Total Protein 08/01/2021 5.2 (A)  6.5 - 8.1 g/dL Final   Albumin 08/01/2021 2.4 (A)  3.5 - 5.0 g/dL Final   AST 08/01/2021 178 (A)  15 - 41 U/L Final   ALT 08/01/2021 124 (A)  0 - 44 U/L Final   Alkaline Phosphatase 08/01/2021 57  38 - 126 U/L Final   Total Bilirubin 08/01/2021 0.9  0.3 - 1.2 mg/dL Final   GFR, Estimated 08/01/2021 19 (A)  >60 mL/min Final   Anion gap 08/01/2021 9  5 - 15 Final   WBC 08/01/2021 3.1 (A)  4.0 - 10.5 K/uL Final    RBC 08/01/2021 2.96 (A)  3.87 - 5.11 MIL/uL Final   Hemoglobin 08/01/2021 7.9 (A)  12.0 - 15.0 g/dL Final   HCT 08/01/2021 26.6 (A)  36.0 - 46.0 % Final   MCV 08/01/2021 89.9  80.0 - 100.0 fL Final   MCH 08/01/2021 26.7  26.0 - 34.0 pg Final   MCHC 08/01/2021 29.7 (A)  30.0 - 36.0 g/dL Final   RDW 08/01/2021 16.1 (A)  11.5 - 15.5 % Final   Platelets 08/01/2021 164  150 - 400 K/uL Final   nRBC 08/01/2021 0.0  0.0 - 0.2 % Final   TSH 08/01/2021 1.466  0.350 - 4.500 uIU/mL Final   Procalcitonin 07/31/2021 0.16  ng/mL Final   Procalcitonin 08/01/2021 0.13  ng/mL Final   Weight 08/01/2021 2,571.45  oz Final   Height 08/01/2021 62  in Final   BP 08/01/2021 98/70  mmHg Final   Ao pk vel 08/01/2021 1.84  m/s Final   AV Area VTI 08/01/2021 2.22  cm2 Final  AR max vel 08/01/2021 2.16  cm2 Final   AV Mean grad 08/01/2021 8.0  mmHg Final   AV Peak grad 08/01/2021 13.5  mmHg Final   S' Lateral 08/01/2021 2.70  cm Final   AV Area mean vel 08/01/2021 2.16  cm2 Final   Area-P 1/2 08/01/2021 3.56  cm2 Final   MV VTI 08/01/2021 2.58  cm2 Final   Magnesium 08/01/2021 2.0  1.7 - 2.4 mg/dL Final   Vitamin B-12 08/01/2021 702  180 - 914 pg/mL Final   Folate 08/01/2021 15.2  >5.9 ng/mL Final   Iron 08/01/2021 35  28 - 170 ug/dL Final   TIBC 08/01/2021 255  250 - 450 ug/dL Final   Saturation Ratios 08/01/2021 14  10.4 - 31.8 % Final   UIBC 08/01/2021 220  ug/dL Final   Ferritin 08/01/2021 73  11 - 307 ng/mL Final   Retic Ct Pct 08/01/2021 1.9  0.4 - 3.1 % Final   RBC. 08/01/2021 3.02 (A)  3.87 - 5.11 MIL/uL Final   Retic Count, Absolute 08/01/2021 57.1  19.0 - 186.0 K/uL Final   Immature Retic Fract 08/01/2021 16.2 (A)  2.3 - 15.9 % Final   Total CK 08/01/2021 3,525 (A)  38 - 234 U/L Final   Procalcitonin 08/02/2021 <0.10  ng/mL Final   Sodium 08/02/2021 139  135 - 145 mmol/L Final   Potassium 08/02/2021 5.1  3.5 - 5.1 mmol/L Final   Chloride 08/02/2021 109  98 - 111 mmol/L Final   CO2  08/02/2021 22  22 - 32 mmol/L Final   Glucose, Bld 08/02/2021 122 (A)  70 - 99 mg/dL Final   BUN 08/02/2021 43 (A)  8 - 23 mg/dL Final   Creatinine, Ser 08/02/2021 2.11 (A)  0.44 - 1.00 mg/dL Final   Calcium 08/02/2021 8.0 (A)  8.9 - 10.3 mg/dL Final   Total Protein 08/02/2021 5.2 (A)  6.5 - 8.1 g/dL Final   Albumin 08/02/2021 2.4 (A)  3.5 - 5.0 g/dL Final   AST 08/02/2021 148 (A)  15 - 41 U/L Final   ALT 08/02/2021 134 (A)  0 - 44 U/L Final   Alkaline Phosphatase 08/02/2021 56  38 - 126 U/L Final   Total Bilirubin 08/02/2021 0.9  0.3 - 1.2 mg/dL Final   GFR, Estimated 08/02/2021 23 (A)  >60 mL/min Final   Anion gap 08/02/2021 8  5 - 15 Final    Imaging Studies  ECHOCARDIOGRAM COMPLETE  Result Date: 08/01/2021    ECHOCARDIOGRAM REPORT   Patient Name:   NEESA KNAPIK Date of Exam: 08/01/2021 Medical Rec #:  [id number]        Height:       62.0 in Accession #:    [id number]       Weight:       160.7 lb Date of Birth:  12/11/1934        BSA:          1.742 m Patient Age:    75 years         BP:           98/70 mmHg Patient Gender: F                HR:           68 bpm. Exam Location:  [facility] Procedure: 2D Echo, Color Doppler and Cardiac Doppler Indications:     I21.4 NSTEMI  History:         Patient has  no prior history of Echocardiogram examinations.                  CHF, Stroke and CKD; Risk Factors:Hypertension and                  Dyslipidemia.  Sonographer:     Charmayne Sheer Referring Phys:  [id number] Athena Masse Diagnosing Phys: Ida Rogue MD IMPRESSIONS  1. Left ventricular ejection fraction, by estimation, is 60 to 65%. The left ventricle has normal function. The left ventricle has no regional wall motion abnormalities. Left ventricular diastolic parameters are indeterminate.  2. Right ventricular systolic function is normal. The right ventricular size is normal. Tricuspid regurgitation signal is inadequate for assessing PA pressure.  3. Left atrial size was mildly dilated.  4. The mitral  valve is normal in structure. Mild mitral valve regurgitation. No evidence of mitral stenosis.  5. The aortic valve is normal in structure. Aortic valve regurgitation is mild. Mild aortic valve sclerosis is present, with no evidence of aortic valve stenosis.  6. There is borderline dilatation of the ascending aorta, measuring 36 mm.  7. The inferior vena cava is normal in size with greater than 50% respiratory variability, suggesting right atrial pressure of 3 mmHg. FINDINGS  Left Ventricle: Left ventricular ejection fraction, by estimation, is 60 to 65%. The left ventricle has normal function. The left ventricle has no regional wall motion abnormalities. The left ventricular internal cavity size was normal in size. There is  no left ventricular hypertrophy. Left ventricular diastolic parameters are indeterminate. Right Ventricle: The right ventricular size is normal. No increase in right ventricular wall thickness. Right ventricular systolic function is normal. Tricuspid regurgitation signal is inadequate for assessing PA pressure. Left Atrium: Left atrial size was mildly dilated. Right Atrium: Right atrial size was normal in size. Pericardium: There is no evidence of pericardial effusion. Mitral Valve: The mitral valve is normal in structure. Mild mitral valve regurgitation. No evidence of mitral valve stenosis. MV peak gradient, 7.2 mmHg. The mean mitral valve gradient is 3.0 mmHg. Tricuspid Valve: The tricuspid valve is normal in structure. Tricuspid valve regurgitation is mild . No evidence of tricuspid stenosis. Aortic Valve: The aortic valve is normal in structure. Aortic valve regurgitation is mild. Mild aortic valve sclerosis is present, with no evidence of aortic valve stenosis. Aortic valve mean gradient measures 8.0 mmHg. Aortic valve peak gradient measures 13.5 mmHg. Aortic valve area, by VTI measures 2.22 cm. Pulmonic Valve: The pulmonic valve was normal in structure. Pulmonic valve regurgitation is  not visualized. No evidence of pulmonic stenosis. Aorta: The aortic root is normal in size and structure. There is borderline dilatation of the ascending aorta, measuring 36 mm. Venous: The inferior vena cava is normal in size with greater than 50% respiratory variability, suggesting right atrial pressure of 3 mmHg. IAS/Shunts: No atrial level shunt detected by color flow Doppler.  LEFT VENTRICLE PLAX 2D LVIDd:         4.70 cm   Diastology LVIDs:         2.70 cm   LV e' medial:    11.00 cm/s LV PW:         1.30 cm   LV E/e' medial:  12.2 LV IVS:        0.80 cm   LV e' lateral:   10.30 cm/s LVOT diam:     1.90 cm   LV E/e' lateral: 13.1 LV SV:  89 LV SV Index:   51 LVOT Area:     2.84 cm  RIGHT VENTRICLE RV Basal diam:  3.30 cm RV S prime:     14.90 cm/s LEFT ATRIUM             Index        RIGHT ATRIUM           Index LA diam:        4.40 cm 2.53 cm/m   RA Area:     13.20 cm LA Vol (A2C):   95.7 ml 54.94 ml/m  RA Volume:   26.90 ml  15.44 ml/m LA Vol (A4C):   65.6 ml 37.66 ml/m LA Biplane Vol: 81.1 ml 46.56 ml/m  AORTIC VALVE                     PULMONIC VALVE AV Area (Vmax):    2.16 cm      PV Vmax:       1.19 m/s AV Area (Vmean):   2.16 cm      PV Vmean:      77.100 cm/s AV Area (VTI):     2.22 cm      PV VTI:        0.226 m AV Vmax:           184.00 cm/s   PV Peak grad:  5.7 mmHg AV Vmean:          131.000 cm/s  PV Mean grad:  3.0 mmHg AV VTI:            0.399 m AV Peak Grad:      13.5 mmHg AV Mean Grad:      8.0 mmHg LVOT Vmax:         140.00 cm/s LVOT Vmean:        99.800 cm/s LVOT VTI:          0.313 m LVOT/AV VTI ratio: 0.78  AORTA Ao Root diam: 3.00 cm MITRAL VALVE MV Area (PHT): 3.56 cm     SHUNTS MV Area VTI:   2.58 cm     Systemic VTI:  0.31 m MV Peak grad:  7.2 mmHg     Systemic Diam: 1.90 cm MV Mean grad:  3.0 mmHg MV Vmax:       1.34 m/s MV Vmean:      71.7 cm/s MV Decel Time: 213 msec MV E velocity: 134.50 cm/s Ida Rogue MD Electronically signed by Ida Rogue MD Signature  Date/Time: 08/01/2021/12:41:40 PM    Final    Medications   Scheduled Meds:  apixaban  2.5 mg Oral BID   ferrous sulfate  325 mg Oral QODAY   levothyroxine  50 mcg Oral Q0600   predniSONE  20 mg Oral Q breakfast   No recently discontinued medications to reconcile  LOS: 2 days   Time spent: >85min  Richarda Osmond, DO [facility] 08/02/2021, 7:35 AM   Please refer to amion to contact the [facility] Attending or Consulting provider for this pt  [email or web address] Available by Epic secure chat 7AM-7PM. If 7PM-7AM, please contact night-coverage

## 2021-08-03 ENCOUNTER — Inpatient Hospital Stay: Payer: Medicare HMO

## 2021-08-03 DIAGNOSIS — E039 Hypothyroidism, unspecified: Secondary | ICD-10-CM

## 2021-08-03 DIAGNOSIS — I952 Hypotension due to drugs: Secondary | ICD-10-CM

## 2021-08-03 LAB — BASIC METABOLIC PANEL
Anion gap: 8 (ref 5–15)
BUN: 40 mg/dL — ABNORMAL HIGH (ref 8–23)
CO2: 24 mmol/L (ref 22–32)
Calcium: 8.3 mg/dL — ABNORMAL LOW (ref 8.9–10.3)
Chloride: 108 mmol/L (ref 98–111)
Creatinine, Ser: 1.85 mg/dL — ABNORMAL HIGH (ref 0.44–1.00)
GFR, Estimated: 27 mL/min — ABNORMAL LOW (ref >60)
Glucose, Bld: 108 mg/dL — ABNORMAL HIGH (ref 70–99)
Potassium: 4.8 mmol/L (ref 3.5–5.1)
Sodium: 140 mmol/L (ref 135–145)

## 2021-08-03 LAB — CBC
HCT: 25.5 % — ABNORMAL LOW (ref 36.0–46.0)
Hemoglobin: 8 g/dL — ABNORMAL LOW (ref 12.0–15.0)
MCH: 28.5 pg (ref 26.0–34.0)
MCHC: 31.4 g/dL (ref 30.0–36.0)
MCV: 90.7 fL (ref 80.0–100.0)
Platelets: 169 10*3/uL (ref 150–400)
RBC: 2.81 MIL/uL — ABNORMAL LOW (ref 3.87–5.11)
RDW: 16.4 % — ABNORMAL HIGH (ref 11.5–15.5)
WBC: 4.2 10*3/uL (ref 4.0–10.5)
nRBC: 0 % (ref 0.0–0.2)

## 2021-08-03 MED ORDER — APIXABAN 2.5 MG PO TABS: 5.0000 mg | ORAL_TABLET | Freq: Two times a day (BID) | ORAL | Status: AC

## 2021-08-03 MED ORDER — SODIUM CHLORIDE 0.9 % IV SOLN
500.0000 mg | Freq: Once | INTRAVENOUS | Status: AC
  Administered 2021-08-03: 500 mg via INTRAVENOUS
  Filled 2021-08-03: qty 10

## 2021-08-03 MED ORDER — SENNOSIDES 8.6 MG PO TABS: 1.0000 | ORAL_TABLET | Freq: Two times a day (BID) | ORAL | Status: AC | PRN

## 2021-08-03 MED ORDER — SODIUM CHLORIDE 0.9 % IV SOLN
25.0000 mg | Freq: Once | INTRAVENOUS | Status: DC
  Filled 2021-08-03: qty 0.5

## 2021-08-03 NOTE — Progress Notes (Signed)
Mobility Specialist - Progress Note   08/03/21 1200  Mobility  Activity Ambulated in hall;Transferred:  Bed to chair  Level of Assistance Minimal assist, patient does 75% or more  Assistive Device Front wheel walker  Distance Ambulated (ft) 50 ft  Mobility Ambulated with assistance in hallway;Out of bed to chair with meals  Mobility Response Tolerated well  Mobility performed by Mobility specialist  $Mobility charge 1 Mobility    Pt sleeping in bed upon arrival, utilizing RA. Awakened by voice, able to exit R side of bed with minA. No dizziness. Extra time needed for transfers. Ambulated in hallway with minG and cueing for direction d/t vision impairment. HR 91 bpm. Does voice weakness in LE and mild fatigue, limiting further activity. Pt returned to recliner with alarm set. Lunch tray arrival at end of session and placed in front of pt.   Kathee Delton Mobility Specialist 08/03/21, 12:20 PM

## 2021-08-03 NOTE — Discharge Summary (Signed)
Physician Discharge Summary  Penny White HBZ:[id number] DOB: Sep 18, 1935 DOA: 07/31/2021  PCP: Leonel Ramsay, MD  Admit date: 07/31/2021 Discharge date: 08/03/2021  Admitted From: SNF Disposition: Skilled nursing facility  Recommendations for Outpatient Follow-up:  Follow up with PCP within 1-2 weeks to evaluate kidney function and BP/HR/orthostatics- torsemide and lisinopril have been held at discharge Also follow up if to restart statin or not. Held at [employer]. Follow up with heme/onc for anemia, and f/u of lymphoma  Nursing and Physical therapy Equipment/Devices:front wheel walker  Discharge Condition:good CODE STATUS:  Code Status: DNR  Regular healthy diet  Brief/Interim Summary: Pt presented with generalized weakness and had several reasons to contribute to this that were evaluated and treated on admission. Anemia due to chronic disease- iron infusion 11/12 AKI on CKD- improved with IV fluids and holding of nephrotoxic agents Untreated small cell B-cell lymphoma- received CT chest/abd/pelvis per oncology recommendations and will follow up outpatient as she declined treatment currently. Orthostatic hypotension related to poor PO and increased diuretics prior to admission- those were held and patient was rehydrated.  She was seen by PT/OT throughout her stay and continued to improve her functionality but will require increased support as she recovers from deconditioning.   Discharge Diagnoses:  Principal Problem:   Generalized weakness Active Problems:   Elevated troponin   Essential hypertension   Personal history of transient ischemic attack (TIA), and cerebral infarction without residual deficits   Anemia in chronic kidney disease   Chronic anticoagulation   Acute kidney injury superimposed on CKD IV (HCC)   Elevated LFTs   Coronary artery disease   Small cell B-cell lymphoma, unspecified site (HCC)   Hypothyroid   Polymyalgia rheumatica (HCC)   Current chronic  use of systemic steroids   Elevated lactic acid level   UTI (urinary tract infection)   Bradycardia   Lactic acidosis   Hypotension   Dehydration   Persistent atrial fibrillation [facility])   Discharge Instructions     Discharge patient   Complete by: As directed    Discharge disposition: 03-Skilled Nursing Facility   Discharge patient date: 08/03/2021      Allergies as of 08/03/2021       Reactions   Ciprofloxacin Itching   Novocain [procaine]    Doesn't remember reaction        Medication List     STOP taking these medications    lidocaine 5 % Commonly known as: LIDODERM   losartan 25 MG tablet Commonly known as: COZAAR   mupirocin ointment 2 % Commonly known as: BACTROBAN   rosuvastatin 40 MG tablet Commonly known as: CRESTOR   torsemide 10 MG tablet Commonly known as: DEMADEX       TAKE these medications    acetaminophen 500 MG tablet Commonly known as: TYLENOL Take 1 tablet (500 mg total) by mouth every 4 (four) hours as needed for moderate pain.   apixaban 2.5 MG Tabs tablet Commonly known as: ELIQUIS Take 2 tablets (5 mg total) by mouth 2 (two) times daily. What changed: medication strength   carboxymethylcellulose 0.5 % Soln Commonly known as: REFRESH PLUS Place 1 drop into both eyes 3 (three) times daily as needed.   ferrous sulfate 325 (65 FE) MG tablet Take 325 mg by mouth every other day.   levothyroxine 50 MCG tablet Commonly known as: SYNTHROID Take 50 mcg by mouth daily before breakfast.   MULTIVITAMIN/IRON PO Take 1 tablet by mouth daily.   predniSONE 5 MG tablet Commonly  known as: DELTASONE Take 5 mg by mouth daily with breakfast.   senna 8.6 MG tablet Commonly known as: SENOKOT Take 1 tablet (8.6 mg total) by mouth 2 (two) times daily as needed for constipation. What changed:  when to take this reasons to take this        Allergies  Allergen Reactions   Ciprofloxacin Itching   Novocain [Procaine]     Doesn't  remember reaction    Consultations: Heme/onc Cardiology Nephrology   Procedures/Studies: DG Chest Portable 1 View  Result Date: 07/31/2021 CLINICAL DATA:  Weakness over the last 2 days.  Question pneumonia. EXAM: PORTABLE CHEST 1 VIEW COMPARISON:  None. FINDINGS: Heart size is normal. Tortuosity of the aorta. The lungs are clear. Evidence of infiltrate, collapse or effusion. No sign of heart failure. No acute bone finding. IMPRESSION: Tortuosity of the aorta. No active disease evident. No sign of pneumonia. Electronically Signed   By: Nelson Chimes M.D.   On: 07/31/2021 17:00   ECHOCARDIOGRAM COMPLETE  Result Date: 08/01/2021    ECHOCARDIOGRAM REPORT   Patient Name:   Penny White Date of Exam: 08/01/2021 Medical Rec #:  [id number]        Height:       62.0 in Accession #:    [id number]       Weight:       160.7 lb Date of Birth:  02-28-1935        BSA:          1.742 m Patient Age:    86 years         BP:           98/70 mmHg Patient Gender: F                HR:           68 bpm. Exam Location:  [facility] Procedure: 2D Echo, Color Doppler and Cardiac Doppler Indications:     I21.4 NSTEMI  History:         Patient has no prior history of Echocardiogram examinations.                  CHF, Stroke and CKD; Risk Factors:Hypertension and                  Dyslipidemia.  Sonographer:     Charmayne Sheer Referring Phys:  [id number] Athena Masse Diagnosing Phys: Ida Rogue MD IMPRESSIONS  1. Left ventricular ejection fraction, by estimation, is 60 to 65%. The left ventricle has normal function. The left ventricle has no regional wall motion abnormalities. Left ventricular diastolic parameters are indeterminate.  2. Right ventricular systolic function is normal. The right ventricular size is normal. Tricuspid regurgitation signal is inadequate for assessing PA pressure.  3. Left atrial size was mildly dilated.  4. The mitral valve is normal in structure. Mild mitral valve regurgitation. No evidence of mitral  stenosis.  5. The aortic valve is normal in structure. Aortic valve regurgitation is mild. Mild aortic valve sclerosis is present, with no evidence of aortic valve stenosis.  6. There is borderline dilatation of the ascending aorta, measuring 36 mm.  7. The inferior vena cava is normal in size with greater than 50% respiratory variability, suggesting right atrial pressure of 3 mmHg. FINDINGS  Left Ventricle: Left ventricular ejection fraction, by estimation, is 60 to 65%. The left ventricle has normal function. The left ventricle has no regional wall motion abnormalities. The left ventricular internal cavity  size was normal in size. There is  no left ventricular hypertrophy. Left ventricular diastolic parameters are indeterminate. Right Ventricle: The right ventricular size is normal. No increase in right ventricular wall thickness. Right ventricular systolic function is normal. Tricuspid regurgitation signal is inadequate for assessing PA pressure. Left Atrium: Left atrial size was mildly dilated. Right Atrium: Right atrial size was normal in size. Pericardium: There is no evidence of pericardial effusion. Mitral Valve: The mitral valve is normal in structure. Mild mitral valve regurgitation. No evidence of mitral valve stenosis. MV peak gradient, 7.2 mmHg. The mean mitral valve gradient is 3.0 mmHg. Tricuspid Valve: The tricuspid valve is normal in structure. Tricuspid valve regurgitation is mild . No evidence of tricuspid stenosis. Aortic Valve: The aortic valve is normal in structure. Aortic valve regurgitation is mild. Mild aortic valve sclerosis is present, with no evidence of aortic valve stenosis. Aortic valve mean gradient measures 8.0 mmHg. Aortic valve peak gradient measures 13.5 mmHg. Aortic valve area, by VTI measures 2.22 cm. Pulmonic Valve: The pulmonic valve was normal in structure. Pulmonic valve regurgitation is not visualized. No evidence of pulmonic stenosis. Aorta: The aortic root is normal in  size and structure. There is borderline dilatation of the ascending aorta, measuring 36 mm. Venous: The inferior vena cava is normal in size with greater than 50% respiratory variability, suggesting right atrial pressure of 3 mmHg. IAS/Shunts: No atrial level shunt detected by color flow Doppler.  LEFT VENTRICLE PLAX 2D LVIDd:         4.70 cm   Diastology LVIDs:         2.70 cm   LV e' medial:    11.00 cm/s LV PW:         1.30 cm   LV E/e' medial:  12.2 LV IVS:        0.80 cm   LV e' lateral:   10.30 cm/s LVOT diam:     1.90 cm   LV E/e' lateral: 13.1 LV SV:         89 LV SV Index:   51 LVOT Area:     2.84 cm  RIGHT VENTRICLE RV Basal diam:  3.30 cm RV S prime:     14.90 cm/s LEFT ATRIUM             Index        RIGHT ATRIUM           Index LA diam:        4.40 cm 2.53 cm/m   RA Area:     13.20 cm LA Vol (A2C):   95.7 ml 54.94 ml/m  RA Volume:   26.90 ml  15.44 ml/m LA Vol (A4C):   65.6 ml 37.66 ml/m LA Biplane Vol: 81.1 ml 46.56 ml/m  AORTIC VALVE                     PULMONIC VALVE AV Area (Vmax):    2.16 cm      PV Vmax:       1.19 m/s AV Area (Vmean):   2.16 cm      PV Vmean:      77.100 cm/s AV Area (VTI):     2.22 cm      PV VTI:        0.226 m AV Vmax:           184.00 cm/s   PV Peak grad:  5.7 mmHg AV Vmean:  131.000 cm/s  PV Mean grad:  3.0 mmHg AV VTI:            0.399 m AV Peak Grad:      13.5 mmHg AV Mean Grad:      8.0 mmHg LVOT Vmax:         140.00 cm/s LVOT Vmean:        99.800 cm/s LVOT VTI:          0.313 m LVOT/AV VTI ratio: 0.78  AORTA Ao Root diam: 3.00 cm MITRAL VALVE MV Area (PHT): 3.56 cm     SHUNTS MV Area VTI:   2.58 cm     Systemic VTI:  0.31 m MV Peak grad:  7.2 mmHg     Systemic Diam: 1.90 cm MV Mean grad:  3.0 mmHg MV Vmax:       1.34 m/s MV Vmean:      71.7 cm/s MV Decel Time: 213 msec MV E velocity: 134.50 cm/s Ida Rogue MD Electronically signed by Ida Rogue MD Signature Date/Time: 08/01/2021/12:41:40 PM    Final    CT CHEST ABDOMEN PELVIS WO  CONTRAST  Result Date: 08/03/2021 CLINICAL DATA:  Weakness.  Lymphadenopathy EXAM: CT CHEST, ABDOMEN AND PELVIS WITHOUT CONTRAST TECHNIQUE: Multidetector CT imaging of the chest, abdomen and pelvis was performed following the standard protocol without IV contrast. COMPARISON:  PET-CT 04/22/2021 FINDINGS: CT CHEST FINDINGS Cardiovascular: No significant vascular findings. Normal heart size. No pericardial effusion. Mediastinum/Nodes: Enlarged axillary lymph nodes are similar to comparison PET-CT scan. For example 19 mm LEFT axillary node (image 29/2) compares to 19 mm. 18 mm LEFT axillary node compares to 17 mm. Prevascular node in the mediastinum measures 12 mm unchanged. Lungs/Pleura: No suspicious pulmonary nodules. No acute pulmonary parenchymal findings Musculoskeletal: No aggressive osseous lesion. CT ABDOMEN AND PELVIS FINDINGS Hepatobiliary: No focal hepatic lesion. No biliary ductal dilatation. Gallbladder is normal. Common bile duct is normal. Pancreas: Pancreas is normal. No ductal dilatation. No pancreatic inflammation. Spleen: Normal volume spleen. Small low-density 10 mm lesion in the spleen not changed from prior. No metabolic activity on comparison PET-CT scan. Adrenals/urinary tract: Adrenal glands and kidneys are normal. The ureters and bladder normal. Stomach/Bowel: Stomach, small bowel, appendix, and cecum are normal. Multiple diverticula of the transverse, descending, and sigmoid colon without evidence acute inflammation. Vascular/Lymphatic: Abdominal aorta is normal caliber with atherosclerotic calcification. There is no retroperitoneal or periportal lymphadenopathy. No pelvic lymphadenopathy. Reproductive: Uterus and adnexa unremarkable. Other: No free fluid. Musculoskeletal: No aggressive osseous lesion. IMPRESSION: Chest Impression: 1. Stable bilateral axillary lymphadenopathy. 2. No acute pulmonary parenchymal findings. Abdomen / Pelvis Impression: 1. No acute findings in the abdomen  pelvis. 2. No lymphadenopathy.  Normal volume spleen. 3. Extensive colon diverticulosis without evidence acute diverticulitis. Electronically Signed   By: Suzy Bouchard M.D.   On: 08/03/2021 11:05    Subjective: Patient feels well today. She would like to go home and feels that she can take care of her ADLs. She is agreeable to getting CT scan today but would not like further interventions. Agreed to receive IV iron treatment.   Discharge Exam: Vitals:   08/03/21 0745 08/03/21 1123  BP: (!) 118/52 (!) 107/41  Pulse: (!) 57 (!) 55  Resp: 18 18  Temp: 97.9 F (36.6 C) 97.7 F (36.5 C)  SpO2: 100% 99%    General: Pt is alert, awake, not in acute distress Cardiovascular: RRR, quick cap refill Respiratory: CTA bilaterally, no wheezing, no rhonchi. Normal effort Abdominal: Soft,  NT, ND, bowel sounds + Extremities: no edema, no cyanosis  Labs: Basic Metabolic Panel: Recent Labs  Lab 07/31/21 1713 08/01/21 0350 08/01/21 0553 08/02/21 0448 08/03/21 0521  NA 136 138  --  139 140  K 3.6 2.5*  --  5.1 4.8  CL 96* 104  --  109 108  CO2 26 25  --  22 24  GLUCOSE 114* 86  --  122* 108*  BUN 47* 47*  --  43* 40*  CREATININE 2.74* 2.43*  --  2.11* 1.85*  CALCIUM 8.5* 7.5*  --  8.0* 8.3*  MG  --   --  2.0  --   --    CBC: Recent Labs  Lab 07/31/21 1713 08/01/21 0350 08/02/21 0440 08/03/21 0521  WBC 4.1 3.1* 4.1 4.2  NEUTROABS 3.2  --   --   --   HGB 8.9* 7.9* 8.1* 8.0*  HCT 29.4* 26.6* 27.4* 25.5*  MCV 90.5 89.9 93.8 90.7  PLT 192 164 178 169    Microbiology Recent Results (from the past 240 hour(s))  Resp Panel by RT-PCR (Flu A&B, Covid) Nasopharyngeal Swab     Status: None   Collection Time: 07/31/21  4:55 PM   Specimen: Nasopharyngeal Swab; Nasopharyngeal(NP) swabs in vial transport medium  Result Value Ref Range Status   SARS Coronavirus 2 by RT PCR NEGATIVE NEGATIVE Final    Comment: (NOTE) SARS-CoV-2 target nucleic acids are NOT DETECTED.  The SARS-CoV-2 RNA  is generally detectable in upper respiratory specimens during the acute phase of infection. The lowest concentration of SARS-CoV-2 viral copies this assay can detect is 138 copies/mL. A negative result does not preclude SARS-Cov-2 infection and should not be used as the sole basis for treatment or other patient management decisions. A negative result may occur with  improper specimen collection/handling, submission of specimen other than nasopharyngeal swab, presence of viral mutation(s) within the areas targeted by this assay, and inadequate number of viral copies(<138 copies/mL). A negative result must be combined with clinical observations, patient history, and epidemiological information. The expected result is Negative.  Fact Sheet for Patients:  EntrepreneurPulse.com.au  Fact Sheet for Healthcare Providers:  IncredibleEmployment.be  This test is no t yet approved or cleared by the [place] FDA and  has been authorized for detection and/or diagnosis of SARS-CoV-2 by FDA under an Emergency Use Authorization (EUA). This EUA will remain  in effect (meaning this test can be used) for the duration of the COVID-19 declaration under Section 564(b)(1) of the Act, 21 U.S.C.section 360bbb-3(b)(1), unless the authorization is terminated  or revoked sooner.       Influenza A by PCR NEGATIVE NEGATIVE Final   Influenza B by PCR NEGATIVE NEGATIVE Final    Comment: (NOTE) The Xpert Xpress SARS-CoV-2/FLU/RSV plus assay is intended as an aid in the diagnosis of influenza from Nasopharyngeal swab specimens and should not be used as a sole basis for treatment. Nasal washings and aspirates are unacceptable for Xpert Xpress SARS-CoV-2/FLU/RSV testing.  Fact Sheet for Patients: EntrepreneurPulse.com.au  Fact Sheet for Healthcare Providers: IncredibleEmployment.be  This test is not yet approved or cleared by the  [place] FDA and has been authorized for detection and/or diagnosis of SARS-CoV-2 by FDA under an Emergency Use Authorization (EUA). This EUA will remain in effect (meaning this test can be used) for the duration of the COVID-19 declaration under Section 564(b)(1) of the Act, 21 U.S.C. section 360bbb-3(b)(1), unless the authorization is terminated or revoked.  Performed at [facility], [address] [place], [place] [address]   Blood culture (routine x 2)     Status: None (Preliminary result)   Collection Time: 07/31/21  8:04 PM   Specimen: BLOOD  Result Value Ref Range Status   Specimen Description BLOOD BLOOD LEFT HAND  Final   Special Requests   Final    BOTTLES DRAWN AEROBIC AND ANAEROBIC Blood Culture results may not be optimal due to an inadequate volume of blood received in culture bottles   Culture   Final    NO GROWTH 3 DAYS Performed at [facility], [address], [place], [place] [address]    Report Status PENDING  Incomplete  MRSA Next Gen by PCR, Nasal     Status: None   Collection Time: 07/31/21 11:20 PM   Specimen: Nasal Mucosa; Nasal Swab  Result Value Ref Range Status   MRSA by PCR Next Gen NOT DETECTED NOT DETECTED Final    Comment: (NOTE) The GeneXpert MRSA Assay (FDA approved for NASAL specimens only), is one component of a comprehensive MRSA colonization surveillance program. It is not intended to diagnose MRSA infection nor to guide or monitor treatment for MRSA infections. Test performance is not FDA approved in patients less than 54 years old. Performed at [facility], [address] [place], [place] [address]   Blood culture (routine x 2)     Status: None (Preliminary result)   Collection Time: 07/31/21 11:56 PM   Specimen: BLOOD  Result Value Ref Range Status   Specimen Description BLOOD LEFT HAND  Final   Special Requests   Final    BOTTLES DRAWN AEROBIC AND ANAEROBIC Blood Culture results may not be  optimal due to an inadequate volume of blood received in culture bottles   Culture   Final    NO GROWTH 2 DAYS Performed at [facility], [address] [place], [place] [address]    Report Status PENDING  Incomplete    Time coordinating discharge: Over 30 minutes  Richarda Osmond, MD  [facility] 08/03/2021, 12:33 PM Pager   If 7PM-7AM, please contact night-coverage [email or web address] Password [email or web address]

## 2021-08-03 NOTE — Progress Notes (Signed)
[facility]  PROGRESS NOTE   Subjective:   Patient is out of bed to chair. Feels much better.  Objective:  Vital signs in last 24 hours:  Temp:  [97.7 F (36.5 C)-98.5 F (36.9 C)] 97.7 F (36.5 C) (11/12 1123) Pulse Rate:  [51-57] 55 (11/12 1123) Resp:  [16-18] 18 (11/12 1123) BP: (107-124)/(41-52) 107/41 (11/12 1123) SpO2:  [96 %-100 %] 99 % (11/12 1123)  Weight change:  Filed Weights   07/31/21 1548 08/01/21 0344  Weight: 75.8 kg 72.9 kg    Intake/Output: I/O last 3 completed shifts: In: 717 [P.O.:717] Out: 1600 [Urine:1600]   Intake/Output this shift:  Total I/O In: 240 [P.O.:240] Out: -   Physical Exam: General:  No acute distress  Head:  Normocephalic, atraumatic. Moist oral mucosal membranes  Eyes:  Anicteric  Neck:  Supple  Lungs:   Clear to auscultation, normal effort  Heart:  S1S2 no rubs  Abdomen:   Soft, nontender, bowel sounds present  Extremities:  peripheral edema.  Neurologic:  Awake, alert, following commands  Skin:  No lesions  Access:     Basic Metabolic Panel: Recent Labs  Lab 07/31/21 1713 08/01/21 0350 08/01/21 0553 08/02/21 0448 08/03/21 0521  NA 136 138  --  139 140  K 3.6 2.5*  --  5.1 4.8  CL 96* 104  --  109 108  CO2 26 25  --  22 24  GLUCOSE 114* 86  --  122* 108*  BUN 47* 47*  --  43* 40*  CREATININE 2.74* 2.43*  --  2.11* 1.85*  CALCIUM 8.5* 7.5*  --  8.0* 8.3*  MG  --   --  2.0  --   --     CBC: Recent Labs  Lab 07/31/21 1713 08/01/21 0350 08/02/21 0440 08/03/21 0521  WBC 4.1 3.1* 4.1 4.2  NEUTROABS 3.2  --   --   --   HGB 8.9* 7.9* 8.1* 8.0*  HCT 29.4* 26.6* 27.4* 25.5*  MCV 90.5 89.9 93.8 90.7  PLT 192 164 178 169     Urinalysis: Recent Labs    07/31/21 1652  COLORURINE STRAW*  LABSPEC 1.006  PHURINE 6.0  GLUCOSEU NEGATIVE  HGBUR LARGE*  BILIRUBINUR NEGATIVE  KETONESUR NEGATIVE  PROTEINUR 30*  NITRITE NEGATIVE  LEUKOCYTESUR SMALL*      Imaging: CT CHEST ABDOMEN PELVIS  WO CONTRAST  Result Date: 08/03/2021 CLINICAL DATA:  Weakness.  Lymphadenopathy EXAM: CT CHEST, ABDOMEN AND PELVIS WITHOUT CONTRAST TECHNIQUE: Multidetector CT imaging of the chest, abdomen and pelvis was performed following the standard protocol without IV contrast. COMPARISON:  PET-CT 04/22/2021 FINDINGS: CT CHEST FINDINGS Cardiovascular: No significant vascular findings. Normal heart size. No pericardial effusion. Mediastinum/Nodes: Enlarged axillary lymph nodes are similar to comparison PET-CT scan. For example 19 mm LEFT axillary node (image 29/2) compares to 19 mm. 18 mm LEFT axillary node compares to 17 mm. Prevascular node in the mediastinum measures 12 mm unchanged. Lungs/Pleura: No suspicious pulmonary nodules. No acute pulmonary parenchymal findings Musculoskeletal: No aggressive osseous lesion. CT ABDOMEN AND PELVIS FINDINGS Hepatobiliary: No focal hepatic lesion. No biliary ductal dilatation. Gallbladder is normal. Common bile duct is normal. Pancreas: Pancreas is normal. No ductal dilatation. No pancreatic inflammation. Spleen: Normal volume spleen. Small low-density 10 mm lesion in the spleen not changed from prior. No metabolic activity on comparison PET-CT scan. Adrenals/urinary tract: Adrenal glands and kidneys are normal. The ureters and bladder normal. Stomach/Bowel: Stomach, small bowel, appendix, and cecum are normal. Multiple  diverticula of the transverse, descending, and sigmoid colon without evidence acute inflammation. Vascular/Lymphatic: Abdominal aorta is normal caliber with atherosclerotic calcification. There is no retroperitoneal or periportal lymphadenopathy. No pelvic lymphadenopathy. Reproductive: Uterus and adnexa unremarkable. Other: No free fluid. Musculoskeletal: No aggressive osseous lesion. IMPRESSION: Chest Impression: 1. Stable bilateral axillary lymphadenopathy. 2. No acute pulmonary parenchymal findings. Abdomen / Pelvis Impression: 1. No acute findings in the abdomen  pelvis. 2. No lymphadenopathy.  Normal volume spleen. 3. Extensive colon diverticulosis without evidence acute diverticulitis. Electronically Signed   By: Suzy Bouchard M.D.   On: 08/03/2021 11:05     Medications:    iron dextran (INFED/DEXFERRUM) infusion      apixaban  2.5 mg Oral BID   ferrous sulfate  325 mg Oral QODAY   levothyroxine  50 mcg Oral Q0600   predniSONE  20 mg Oral Q breakfast    Assessment/ Plan:     Principal Problem:   Generalized weakness Active Problems:   Elevated troponin   Essential hypertension   Personal history of transient ischemic attack (TIA), and cerebral infarction without residual deficits   Anemia in chronic kidney disease   Chronic anticoagulation   Acute kidney injury superimposed on CKD IV (HCC)   Elevated LFTs   Coronary artery disease   Small cell B-cell lymphoma, unspecified site (HCC)   Hypothyroid   Polymyalgia rheumatica (HCC)   Current chronic use of systemic steroids   Elevated lactic acid level   UTI (urinary tract infection)   Bradycardia   Lactic acidosis   Hypotension   Dehydration   Persistent atrial fibrillation ([place])  #1: Acute kidney injury on CKD: She had acute on chronic kidney disease the acute worsening of the kidney disease secondary to possible diuretic induced prerenal azotemia  #2: Anemia: Patient has low hemoglobin most likely secondary to CKD.  Oncology note appreciated.  #3: Hypertension: Blood pressure is well controlled with present medications.  Advised to follow-up in our office after discharge.   LOS: Bunk Foss, [facility] 11/12/20222:53 PM

## 2021-08-03 NOTE — Progress Notes (Addendum)
Discharge report called / iv and tele removed/ pts son to transport

## 2021-08-03 NOTE — Consult Note (Signed)
Hematology/Oncology Consult note [facility] [phone] [phone]  Patient Care Team: Leonel Ramsay, MD as PCP - General (Infectious Diseases) Minna Merritts, MD as PCP - Cardiology (Cardiology)   Name of the patient: Penny White  [id number]  21-Jun-1935    Reason for referral-anemia, history of recurrent nodal marginal zone lymphoma   Requesting physician: Dr. Ouida Sills  Date of visit:08/02/21   History of presenting illness-patient is a 86 year old female with history of recurrent nodal marginal zone lymphoma.  She initially had most of her care at [facility] and then transferred her care to me recently.  Her PET scan in August 2022 did not show evidence of recurrent lymphoma.  Her last visit with me was on 07/23/2021.  At that time we had again discussed considering treatment options for lymphoma including the use of B TK inhibitors.  Patient and her son wanted to hold off on any treatment and also get a second opinion from [place].  Patient also has a history of chronic anemia and typically her hemoglobin is between 10-11.  She was noted to be more anemic with a hemoglobin of 9 at my outpatient visit.  Although ferritin levels were more than 30 they were still not optimal given her chronic kidney disease and we had discussed both oral versus IV iron with preference for IV iron.  Patient does not want to receive any IV iron as an outpatient she is now admitted with symptoms of generalized weakness.  She has multiple comorbidities including polymyalgia rheumatica hypothyroidism CAD A. fib stage IV chronic kidney disease.  Infectious work-up has been negative so far.  Patient states that she would like to go back to her facility as soon as possible.  Appetite has been poor.  ECOG PS- 3  Pain scale- 4   Review of systems- Review of Systems  Constitutional:  Positive for malaise/fatigue and weight loss. Negative for chills and fever.  HENT:   Negative for congestion, ear discharge and nosebleeds.   Eyes:  Negative for blurred vision.  Respiratory:  Negative for cough, hemoptysis, sputum production, shortness of breath and wheezing.   Cardiovascular:  Negative for chest pain, palpitations, orthopnea and claudication.  Gastrointestinal:  Negative for abdominal pain, blood in stool, constipation, diarrhea, heartburn, melena, nausea and vomiting.  Genitourinary:  Negative for dysuria, flank pain, frequency, hematuria and urgency.  Musculoskeletal:  Negative for back pain, joint pain and myalgias.  Skin:  Negative for rash.  Neurological:  Negative for dizziness, tingling, focal weakness, seizures, weakness and headaches.  Endo/Heme/Allergies:  Does not bruise/bleed easily.  Psychiatric/Behavioral:  Negative for depression and suicidal ideas. The patient does not have insomnia.    Allergies  Allergen Reactions   Ciprofloxacin Itching   Novocain [Procaine]     Doesn't remember reaction    Patient Active Problem List   Diagnosis Date Noted   Dehydration 08/02/2021   Persistent atrial fibrillation ([place]) 08/02/2021   Hypotension    Elevated troponin 07/31/2021   Essential hypertension 07/31/2021   Chronic anticoagulation 07/31/2021   Acute kidney injury superimposed on CKD IV ([place]) 07/31/2021   Elevated LFTs 07/31/2021   Generalized weakness 07/31/2021   Polymyalgia rheumatica ([place]) 07/31/2021   Coronary artery disease    Small cell B-cell lymphoma, unspecified site [facility])    Hypothyroid    Current chronic use of systemic steroids    Elevated lactic acid level    UTI (urinary tract infection)    Bradycardia    Lactic  acidosis    Anemia in chronic kidney disease 07/16/2020   Chronic kidney disease, stage IV (severe) ([place]) 06/18/2020   Chronic atrial fibrillation ([place]) 01/22/2017   Personal history of transient ischemic attack (TIA), and cerebral infarction without residual deficits 09/27/2016   Heart failure with preserved  left ventricular function (HFpEF) ([place]) 06/25/2015   MGUS (monoclonal gammopathy of unknown significance) 05/07/2012     Past Medical History:  Diagnosis Date   Actinic keratosis    Arthritis    Atherosclerosis    Chronic heart failure with preserved ejection fraction (HFpEF) ([place])    a. 01/2020 Echo: EF >55%, mild LVH, nl RV fxn, Mild AI/TR, triv MR/PR; b. 07/2021 Echo: EF 60-65%, no rwma, nl RV fxn. Mildly dil LA. Mild MR/AI. Asc Ao 70mm.   CKD (chronic kidney disease), stage IV (HCC)    Dementia (HCC)    Dyspnea    Encephalitis 2020   Gout    Hyperlipemia    Hypertension    Hypothyroid    Marginal zone lymphoma (HCC)    MGUS (monoclonal gammopathy of unknown significance)    Nonobstructive Coronary artery disease    a. 2016 MV: very mild ant ischemia; b. 2019 MV: worsening ant ischemia; c 2019 Cath: nonobs dzs-->Med rx.   Permanent atrial fibrillation (HCC)    a. CHA2DS2VASc = 7-->eliquis.   Polymyalgia rheumatica (HCC)    Shingles 2020   Small cell B-cell lymphoma, unspecified site ([place])    Squamous cell carcinoma of skin    L arm removed by Dr Phillip Heal    Stroke [facility]) 2018   + 2021.  Peripheral vision damage.   Subdural hemorrhage ([employer]) 2020     Past Surgical History:  Procedure Laterality Date   CARDIAC CATHETERIZATION     CATARACT EXTRACTION W/PHACO Left 12/04/2020   Procedure: CATARACT EXTRACTION PHACO AND INTRAOCULAR LENS PLACEMENT (IOC) LEFT 6.95 00:45.9;  Surgeon: Birder Robson, MD;  Location: [place];  Service: Ophthalmology;  Laterality: Left;   CATARACT EXTRACTION W/PHACO Right 12/18/2020   Procedure: CATARACT EXTRACTION PHACO AND INTRAOCULAR LENS PLACEMENT (IOC) RIGHT 7.07 00:42.9;  Surgeon: Birder Robson, MD;  Location: [place];  Service: Ophthalmology;  Laterality: Right;   CHOLECYSTECTOMY  1978   COLONOSCOPY     LYMPH NODE BIOPSY N/A 05/17/2021   Procedure: RIGHT OCCIPITAL LYMPH NODE BIOPSY;  Surgeon: Robert Bellow, MD;   Location: [facility];  Service: General;  Laterality: N/A;  occipital node biopsy possible axillary node biopsy   REPLACEMENT TOTAL KNEE Bilateral    Right 06/02/03,  left 04/20/06   TUBAL LIGATION      Social History   Socioeconomic History   Marital status: Widowed    Spouse name: Not on file   Number of children: Not on file   Years of education: Not on file   Highest education level: Not on file  Occupational History   Not on file  Tobacco Use   Smoking status: Never   Smokeless tobacco: Never  Vaping Use   Vaping Use: Never used  Substance and Sexual Activity   Alcohol use: Yes    Comment: occasional - wine small amount   Drug use: Not Currently   Sexual activity: Not Currently  Other Topics Concern   Not on file  Social History Narrative   Homeplace in [place]   Social Determinants of Health   Financial Resource Strain: Not on file  Food Insecurity: Not on file  Transportation Needs: Not on file  Physical Activity: Not on file  Stress: Not on file  Social Connections: Not on file  Intimate Partner Violence: Not on file     History reviewed. No pertinent family history.   Current Facility-Administered Medications:    acetaminophen (TYLENOL) tablet 650 mg, 650 mg, Oral, Q6H PRN **OR** acetaminophen (TYLENOL) suppository 650 mg, 650 mg, Rectal, Q6H PRN, Athena Masse, MD   apixaban Arne [place]) tablet 2.5 mg, 2.5 mg, Oral, BID, Judd Gaudier V, MD, 2.5 mg at 08/02/21 2017   ferrous sulfate tablet 325 mg, 325 mg, Oral, QODAY, Athena Masse, MD, 325 mg at 08/02/21 1047   iron dextran complex (INFED) 25 mg in sodium chloride 0.9 % 50 mL IVPB, 25 mg, Intravenous, Once **FOLLOWED BY** iron dextran complex (INFED) 500 mg in sodium chloride 0.9 % 250 mL IVPB, 500 mg, Intravenous, Once, Richarda Osmond, MD   levothyroxine (SYNTHROID) tablet 50 mcg, 50 mcg, Oral, Q0600, Athena Masse, MD, 50 mcg at 08/03/21 0617   ondansetron (ZOFRAN) tablet 4 mg, 4 mg, Oral,  Q6H PRN **OR** ondansetron (ZOFRAN) injection 4 mg, 4 mg, Intravenous, Q6H PRN, Athena Masse, MD   polyvinyl alcohol (LIQUIFILM TEARS) 1.4 % ophthalmic solution 1 drop, 1 drop, Both Eyes, TID PRN, Athena Masse, MD   predniSONE (DELTASONE) tablet 20 mg, 20 mg, Oral, Q breakfast, Richarda Osmond, MD, 20 mg at 08/02/21 1047   Physical exam:  Vitals:   08/02/21 1525 08/02/21 2023 08/03/21 0449 08/03/21 0745  BP: (!) 123/41 (!) 118/51 (!) 124/52 (!) 118/52  Pulse: (!) 51 (!) 53 (!) 54 (!) 57  Resp: 18  16 18   Temp: 98.4 F (36.9 C) 98.5 F (36.9 C) 98.2 F (36.8 C) 97.9 F (36.6 C)  TempSrc:  Oral Oral   SpO2: 99% 99% 96% 100%  Weight:      Height:       Physical Exam Constitutional:      Comments: Patient is elderly and frail.  Appears fatigued  Neck:     Comments: Palpable submandibular adenopathy Cardiovascular:     Rate and Rhythm: Normal rate and regular rhythm.     Heart sounds: Normal heart sounds.  Pulmonary:     Effort: Pulmonary effort is normal.     Breath sounds: Normal breath sounds.  Skin:    General: Skin is warm and dry.  Neurological:     Mental Status: She is alert and oriented to person, place, and time.       CMP Latest Ref Rng & Units 08/03/2021  Glucose 70 - 99 mg/dL 108(H)  BUN 8 - 23 mg/dL 40(H)  Creatinine 0.44 - 1.00 mg/dL 1.85(H)  Sodium 135 - 145 mmol/L 140  Potassium 3.5 - 5.1 mmol/L 4.8  Chloride 98 - 111 mmol/L 108  CO2 22 - 32 mmol/L 24  Calcium 8.9 - 10.3 mg/dL 8.3(L)  Total Protein 6.5 - 8.1 g/dL -  Total Bilirubin 0.3 - 1.2 mg/dL -  Alkaline Phos 38 - 126 U/L -  AST 15 - 41 U/L -  ALT 0 - 44 U/L -   CBC Latest Ref Rng & Units 08/03/2021  WBC 4.0 - 10.5 K/uL 4.2  Hemoglobin 12.0 - 15.0 g/dL 8.0(L)  Hematocrit 36.0 - 46.0 % 25.5(L)  Platelets 150 - 400 K/uL 169    @IMAGES @  DG Chest Portable 1 View  Result Date: 07/31/2021 CLINICAL DATA:  Weakness over the last 2 days.  Question pneumonia. EXAM: PORTABLE CHEST 1  VIEW COMPARISON:  None. FINDINGS: Heart size is normal. Tortuosity of the aorta. The lungs are clear. Evidence of infiltrate, collapse or effusion. No sign of heart failure. No acute bone finding. IMPRESSION: Tortuosity of the aorta. No active disease evident. No sign of pneumonia. Electronically Signed   By: Nelson Chimes M.D.   On: 07/31/2021 17:00   ECHOCARDIOGRAM COMPLETE  Result Date: 08/01/2021    ECHOCARDIOGRAM REPORT   Patient Name:   SHARITA BIENAIME Date of Exam: 08/01/2021 Medical Rec #:  [id number]        Height:       62.0 in Accession #:    [id number]       Weight:       160.7 lb Date of Birth:  18-Jul-1935        BSA:          1.742 m Patient Age:    66 years         BP:           98/70 mmHg Patient Gender: F                HR:           68 bpm. Exam Location:  [facility] Procedure: 2D Echo, Color Doppler and Cardiac Doppler Indications:     I21.4 NSTEMI  History:         Patient has no prior history of Echocardiogram examinations.                  CHF, Stroke and CKD; Risk Factors:Hypertension and                  Dyslipidemia.  Sonographer:     Charmayne Sheer Referring Phys:  [id number] Athena Masse Diagnosing Phys: Ida Rogue MD IMPRESSIONS  1. Left ventricular ejection fraction, by estimation, is 60 to 65%. The left ventricle has normal function. The left ventricle has no regional wall motion abnormalities. Left ventricular diastolic parameters are indeterminate.  2. Right ventricular systolic function is normal. The right ventricular size is normal. Tricuspid regurgitation signal is inadequate for assessing PA pressure.  3. Left atrial size was mildly dilated.  4. The mitral valve is normal in structure. Mild mitral valve regurgitation. No evidence of mitral stenosis.  5. The aortic valve is normal in structure. Aortic valve regurgitation is mild. Mild aortic valve sclerosis is present, with no evidence of aortic valve stenosis.  6. There is borderline dilatation of the ascending aorta, measuring 36  mm.  7. The inferior vena cava is normal in size with greater than 50% respiratory variability, suggesting right atrial pressure of 3 mmHg. FINDINGS  Left Ventricle: Left ventricular ejection fraction, by estimation, is 60 to 65%. The left ventricle has normal function. The left ventricle has no regional wall motion abnormalities. The left ventricular internal cavity size was normal in size. There is  no left ventricular hypertrophy. Left ventricular diastolic parameters are indeterminate. Right Ventricle: The right ventricular size is normal. No increase in right ventricular wall thickness. Right ventricular systolic function is normal. Tricuspid regurgitation signal is inadequate for assessing PA pressure. Left Atrium: Left atrial size was mildly dilated. Right Atrium: Right atrial size was normal in size. Pericardium: There is no evidence of pericardial effusion. Mitral Valve: The mitral valve is normal in structure. Mild mitral valve regurgitation. No evidence of mitral valve stenosis. MV peak gradient, 7.2 mmHg. The mean mitral valve gradient is 3.0 mmHg. Tricuspid Valve: The tricuspid valve  is normal in structure. Tricuspid valve regurgitation is mild . No evidence of tricuspid stenosis. Aortic Valve: The aortic valve is normal in structure. Aortic valve regurgitation is mild. Mild aortic valve sclerosis is present, with no evidence of aortic valve stenosis. Aortic valve mean gradient measures 8.0 mmHg. Aortic valve peak gradient measures 13.5 mmHg. Aortic valve area, by VTI measures 2.22 cm. Pulmonic Valve: The pulmonic valve was normal in structure. Pulmonic valve regurgitation is not visualized. No evidence of pulmonic stenosis. Aorta: The aortic root is normal in size and structure. There is borderline dilatation of the ascending aorta, measuring 36 mm. Venous: The inferior vena cava is normal in size with greater than 50% respiratory variability, suggesting right atrial pressure of 3 mmHg. IAS/Shunts: No  atrial level shunt detected by color flow Doppler.  LEFT VENTRICLE PLAX 2D LVIDd:         4.70 cm   Diastology LVIDs:         2.70 cm   LV e' medial:    11.00 cm/s LV PW:         1.30 cm   LV E/e' medial:  12.2 LV IVS:        0.80 cm   LV e' lateral:   10.30 cm/s LVOT diam:     1.90 cm   LV E/e' lateral: 13.1 LV SV:         89 LV SV Index:   51 LVOT Area:     2.84 cm  RIGHT VENTRICLE RV Basal diam:  3.30 cm RV S prime:     14.90 cm/s LEFT ATRIUM             Index        RIGHT ATRIUM           Index LA diam:        4.40 cm 2.53 cm/m   RA Area:     13.20 cm LA Vol (A2C):   95.7 ml 54.94 ml/m  RA Volume:   26.90 ml  15.44 ml/m LA Vol (A4C):   65.6 ml 37.66 ml/m LA Biplane Vol: 81.1 ml 46.56 ml/m  AORTIC VALVE                     PULMONIC VALVE AV Area (Vmax):    2.16 cm      PV Vmax:       1.19 m/s AV Area (Vmean):   2.16 cm      PV Vmean:      77.100 cm/s AV Area (VTI):     2.22 cm      PV VTI:        0.226 m AV Vmax:           184.00 cm/s   PV Peak grad:  5.7 mmHg AV Vmean:          131.000 cm/s  PV Mean grad:  3.0 mmHg AV VTI:            0.399 m AV Peak Grad:      13.5 mmHg AV Mean Grad:      8.0 mmHg LVOT Vmax:         140.00 cm/s LVOT Vmean:        99.800 cm/s LVOT VTI:          0.313 m LVOT/AV VTI ratio: 0.78  AORTA Ao Root diam: 3.00 cm MITRAL VALVE MV Area (PHT): 3.56 cm     SHUNTS MV Area VTI:  2.58 cm     Systemic VTI:  0.31 m MV Peak grad:  7.2 mmHg     Systemic Diam: 1.90 cm MV Mean grad:  3.0 mmHg MV Vmax:       1.34 m/s MV Vmean:      71.7 cm/s MV Decel Time: 213 msec MV E velocity: 134.50 cm/s Ida Rogue MD Electronically signed by Ida Rogue MD Signature Date/Time: 08/01/2021/12:41:40 PM    Final     Assessment and plan- Patient is a 86 y.o. female with multiple comorbidities admitted for generalized weakness.  She also has a history of recurrent nodal marginal zone lymphoma  Recurrent nodal marginal zone lymphoma: PET CT scan in August 2022 did not show evidence of recurrent  disease but patient has been holding off on starting any treatment.  It is unclear if her present weakness is related to any worsening of her lymphoma.  I did discuss her clinical condition with her son Marcello Moores as well over the phone.  We will proceed with a CT chest abdomen and pelvis without CONTRAST to get a better idea about the status of her lymphoma.  Normocytic anemia: Possibly secondary to chronic kidney disease.  She is not currently on any erythropoietin.  Given that her ferritin levels are less than 100 and iron saturation less than 20% it would be reasonable to give her IV iron while she is in the hospital and patient is Agreeable to Proceed with This.  Did Not Want to Do This As an Outpatient.  She Is Also on Oral Iron.  If Hemoglobin Does Not Improve despite IV Iron Therapy I Will Have Outpatient Discussion If She Would like to Receive Outpatient IV Iron    Visit Diagnosis 1. Hypotension, unspecified hypotension type   2. Lymphadenopathy     Dr. Randa Evens, MD, MPH [facility] at [facility] [phone] 08/03/2021

## 2021-08-03 NOTE — TOC Progression Note (Signed)
Transition of Care [facility]) - Progression Note    Patient Details  Name: ERVIN ROTHBAUER MRN: [id number] Date of Birth: 1934-10-05  Transition of Care [facility]) CM/SW [place], RN Phone Number: 08/03/2021, 1:45 PM  Clinical Narrative:   [facility]leen [facility]ely at [facility].  Patient's son and daughter in law will pick her up to transport her to facility today.  As per facility, patient will have required therapy-per Merleen Nicely, facility administrators have reviewed her records and can provide necessary therapy.     Expected Discharge Plan: [place] Barriers to Discharge: Continued Medical Work up  Expected Discharge Plan and Services Expected Discharge Plan: [place] In-house Referral: NA     Living arrangements for the past 2 months: [place] Expected Discharge Date: 08/03/21                                     Social Determinants of Health (SDOH) Interventions    Readmission Risk Interventions No flowsheet data found.

## 2021-08-05 LAB — CULTURE, BLOOD (ROUTINE X 2): Culture: NO GROWTH

## 2021-08-06 LAB — CULTURE, BLOOD (ROUTINE X 2): Culture: NO GROWTH

## 2021-08-15 ENCOUNTER — Encounter: Payer: Self-pay | Admitting: Dermatology

## 2021-08-20 ENCOUNTER — Other Ambulatory Visit: Payer: Self-pay | Admitting: Dermatology

## 2021-08-20 ENCOUNTER — Ambulatory Visit (INDEPENDENT_AMBULATORY_CARE_PROVIDER_SITE_OTHER): Payer: Medicare HMO | Admitting: Dermatology

## 2021-08-20 ENCOUNTER — Other Ambulatory Visit: Payer: Self-pay

## 2021-08-20 DIAGNOSIS — Z872 Personal history of diseases of the skin and subcutaneous tissue: Secondary | ICD-10-CM | POA: Diagnosis not present

## 2021-08-20 DIAGNOSIS — L820 Inflamed seborrheic keratosis: Secondary | ICD-10-CM

## 2021-08-20 DIAGNOSIS — L578 Other skin changes due to chronic exposure to nonionizing radiation: Secondary | ICD-10-CM

## 2021-08-20 DIAGNOSIS — C44622 Squamous cell carcinoma of skin of right upper limb, including shoulder: Secondary | ICD-10-CM | POA: Diagnosis not present

## 2021-08-20 DIAGNOSIS — C4492 Squamous cell carcinoma of skin, unspecified: Secondary | ICD-10-CM

## 2021-08-20 DIAGNOSIS — D492 Neoplasm of unspecified behavior of bone, soft tissue, and skin: Secondary | ICD-10-CM

## 2021-08-20 HISTORY — DX: Squamous cell carcinoma of skin, unspecified: C44.92

## 2021-08-20 NOTE — Progress Notes (Signed)
Follow-Up Visit   Subjective  Penny White is a 86 y.o. female who presents for the following: ISK f/u (Upper back, 40m f/u, not clear from LN2 and painful), hx of Ulcer (R lower abdomen/groin, pt said resolved), and check spot (L forehead, itchy). The patient has spots, moles and lesions to be evaluated, some may be new or changing and the patient has concerns that these could be cancer.  Patient accompanied by son who contributes to history.  The following portions of the chart were reviewed this encounter and updated as appropriate:   Tobacco  Allergies  Meds  Problems  Med Hx  Surg Hx  Fam Hx     Review of Systems:  No other skin or systemic complaints except as noted in HPI or Assessment and Plan.  Objective  Well appearing patient in no apparent distress; mood and affect are within normal limits.  A focused examination was performed including back, abdomen. Relevant physical exam findings are noted in the Assessment and Plan.  R post shoulder Crusted hyperkeratotic pap 2.1cm  L forehead x 1, Total = 1 Erythematous keratotic or waxy stuck-on papule or plaque.   R lower abdomen/groin Skin clear   Assessment & Plan  Neoplasm of skin R post shoulder Epidermal / dermal shaving  Lesion diameter (cm):  2.1 Informed consent: discussed and consent obtained   Timeout: patient name, date of birth, surgical site, and procedure verified   Procedure prep:  Patient was prepped and draped in usual sterile fashion Prep type:  Isopropyl alcohol Anesthesia: the lesion was anesthetized in a standard fashion   Anesthetic:  1% lidocaine w/ epinephrine 1-100,000 buffered w/ 8.4% NaHCO3 Instrument used: flexible razor blade   Hemostasis achieved with: pressure, aluminum chloride and electrodesiccation   Outcome: patient tolerated procedure well   Post-procedure details: sterile dressing applied and wound care instructions given   Dressing type: bandage and bacitracin     Destruction of lesion Complexity: extensive   Destruction method: electrodesiccation and curettage   Informed consent: discussed and consent obtained   Timeout:  patient name, date of birth, surgical site, and procedure verified Procedure prep:  Patient was prepped and draped in usual sterile fashion Prep type:  Isopropyl alcohol Anesthesia: the lesion was anesthetized in a standard fashion   Anesthetic:  1% lidocaine w/ epinephrine 1-100,000 buffered w/ 8.4% NaHCO3 Curettage performed in three different directions: Yes   Electrodesiccation performed over the curetted area: Yes   Lesion length (cm):  2.1 Lesion width (cm):  2.1 Margin per side (cm):  0.2 Final wound size (cm):  2.5 Hemostasis achieved with:  pressure, aluminum chloride and electrodesiccation Outcome: patient tolerated procedure well with no complications   Post-procedure details: sterile dressing applied and wound care instructions given   Dressing type: bandage and bacitracin    Specimen 1 - Surgical pathology Differential Diagnosis: D48.5 R/O SCC  Check Margins: No Crusted hyperkeratotic pap 2.1cm EDC today  Inflamed seborrheic keratosis L forehead x 1, Total = 1 Destruction of lesion - L forehead x 1, Total = 1 Complexity: simple   Destruction method: cryotherapy   Informed consent: discussed and consent obtained   Timeout:  patient name, date of birth, surgical site, and procedure verified Lesion destroyed using liquid nitrogen: Yes   Region frozen until ice ball extended beyond lesion: Yes   Outcome: patient tolerated procedure well with no complications   Post-procedure details: wound care instructions given    Hx of skin ulcer R lower  abdomen/groin Resolved, observe  Actinic Damage - chronic, secondary to cumulative UV radiation exposure/sun exposure over time - diffuse scaly erythematous macules with underlying dyspigmentation - Recommend daily broad spectrum sunscreen SPF 30+ to sun-exposed  areas, reapply every 2 hours as needed.  - Recommend staying in the shade or wearing long sleeves, sun glasses (UVA+UVB protection) and wide brim hats (4-inch brim around the entire circumference of the hat). - Call for new or changing lesions.  Return if symptoms worsen or fail to improve.  I, Othelia Pulling, RMA, am acting as scribe for Sarina Ser, MD .  Documentation: I have reviewed the above documentation for accuracy and completeness, and I agree with the above.  Sarina Ser, MD

## 2021-08-20 NOTE — Patient Instructions (Addendum)

## 2021-08-23 ENCOUNTER — Telehealth: Payer: Self-pay

## 2021-08-23 NOTE — Telephone Encounter (Signed)
Patient's son Marcello Moores informed of pathology results.

## 2021-08-23 NOTE — Telephone Encounter (Signed)
-----   Message from Ralene Bathe, MD sent at 08/21/2021  5:45 PM EST ----- Diagnosis Skin (M), right post shoulder WELL DIFFERENTIATED SQUAMOUS CELL CARCINOMA, BASE INVOLVED  Cancer - SCC Already treated Recheck next visit

## 2021-08-25 ENCOUNTER — Encounter: Payer: Self-pay | Admitting: Oncology

## 2021-08-26 ENCOUNTER — Encounter: Payer: Self-pay | Admitting: Oncology

## 2021-08-26 NOTE — Telephone Encounter (Signed)
Lauren was supposed to call

## 2021-08-26 NOTE — Telephone Encounter (Signed)
D/w lauren. She will call

## 2021-08-27 ENCOUNTER — Telehealth: Payer: Self-pay | Admitting: Nurse Practitioner

## 2021-08-27 ENCOUNTER — Encounter: Payer: Self-pay | Admitting: Dermatology

## 2021-08-27 DIAGNOSIS — U071 COVID-19: Secondary | ICD-10-CM

## 2021-08-27 NOTE — Telephone Encounter (Signed)
Spoke to [place], patient's daughter in [place]. Dr. Ola Spurr has called in molnupiravir based on her history of CKD. She is not a candidate for paxlovid based on CKD. Currently symptoms are mild. She's drinking well. We discussed monitoring of her vital signs and symptoms as well as symptoms that would warrant ER/hospital evaluation. Recommend 2 week quarantine from [employer]. Will ask Willeen Cass to cancel 09/03/21 lab draw (see below).  Mateo Flow requests to minimize needle sticks when possible and would like to have labs that are currently scheduled for 12/13 rescheduled to coordinate with labs to be drawn at nephrology. Will ask nursing to fax lab orders (cbc, cmp, ldh) to Dr. Assunta Gambles office.

## 2021-08-29 ENCOUNTER — Encounter: Payer: Self-pay | Admitting: *Deleted

## 2021-09-02 ENCOUNTER — Ambulatory Visit: Payer: Medicare HMO | Admitting: Dermatology

## 2021-09-03 ENCOUNTER — Ambulatory Visit: Payer: Medicare HMO | Admitting: Dermatology

## 2021-09-03 ENCOUNTER — Other Ambulatory Visit: Payer: Medicare HMO

## 2021-09-13 ENCOUNTER — Inpatient Hospital Stay
Admission: EM | Admit: 2021-09-13 | Discharge: 2021-09-16 | DRG: 177 | Disposition: A | Discharge status: Home / Self Care | Payer: Medicare HMO | Source: Skilled Nursing Facility | Attending: Internal Medicine | Admitting: Internal Medicine

## 2021-09-13 ENCOUNTER — Other Ambulatory Visit: Payer: Self-pay

## 2021-09-13 ENCOUNTER — Emergency Department: Payer: Medicare HMO

## 2021-09-13 DIAGNOSIS — I4821 Permanent atrial fibrillation: Secondary | ICD-10-CM | POA: Diagnosis present

## 2021-09-13 DIAGNOSIS — I482 Chronic atrial fibrillation, unspecified: Secondary | ICD-10-CM | POA: Diagnosis present

## 2021-09-13 DIAGNOSIS — Z8673 Personal history of transient ischemic attack (TIA), and cerebral infarction without residual deficits: Secondary | ICD-10-CM | POA: Diagnosis not present

## 2021-09-13 DIAGNOSIS — Z7952 Long term (current) use of systemic steroids: Secondary | ICD-10-CM | POA: Diagnosis not present

## 2021-09-13 DIAGNOSIS — I10 Essential (primary) hypertension: Secondary | ICD-10-CM | POA: Diagnosis present

## 2021-09-13 DIAGNOSIS — R6520 Severe sepsis without septic shock: Secondary | ICD-10-CM

## 2021-09-13 DIAGNOSIS — I4819 Other persistent atrial fibrillation: Secondary | ICD-10-CM | POA: Diagnosis not present

## 2021-09-13 DIAGNOSIS — I5032 Chronic diastolic (congestive) heart failure: Secondary | ICD-10-CM | POA: Diagnosis present

## 2021-09-13 DIAGNOSIS — J1282 Pneumonia due to coronavirus disease 2019: Secondary | ICD-10-CM | POA: Diagnosis present

## 2021-09-13 DIAGNOSIS — Z881 Allergy status to other antibiotic agents status: Secondary | ICD-10-CM

## 2021-09-13 DIAGNOSIS — J9601 Acute respiratory failure with hypoxia: Secondary | ICD-10-CM | POA: Diagnosis present

## 2021-09-13 DIAGNOSIS — I5030 Unspecified diastolic (congestive) heart failure: Secondary | ICD-10-CM | POA: Diagnosis present

## 2021-09-13 DIAGNOSIS — Z85828 Personal history of other malignant neoplasm of skin: Secondary | ICD-10-CM

## 2021-09-13 DIAGNOSIS — D631 Anemia in chronic kidney disease: Secondary | ICD-10-CM | POA: Diagnosis present

## 2021-09-13 DIAGNOSIS — N184 Chronic kidney disease, stage 4 (severe): Secondary | ICD-10-CM | POA: Diagnosis present

## 2021-09-13 DIAGNOSIS — Z66 Do not resuscitate: Secondary | ICD-10-CM | POA: Diagnosis present

## 2021-09-13 DIAGNOSIS — Z7901 Long term (current) use of anticoagulants: Secondary | ICD-10-CM

## 2021-09-13 DIAGNOSIS — U071 COVID-19: Principal | ICD-10-CM | POA: Diagnosis present

## 2021-09-13 DIAGNOSIS — Z79890 Hormone replacement therapy (postmenopausal): Secondary | ICD-10-CM

## 2021-09-13 DIAGNOSIS — J189 Pneumonia, unspecified organism: Secondary | ICD-10-CM | POA: Diagnosis present

## 2021-09-13 DIAGNOSIS — Z96653 Presence of artificial knee joint, bilateral: Secondary | ICD-10-CM | POA: Diagnosis present

## 2021-09-13 DIAGNOSIS — Z8661 Personal history of infections of the central nervous system: Secondary | ICD-10-CM | POA: Diagnosis not present

## 2021-09-13 DIAGNOSIS — J159 Unspecified bacterial pneumonia: Secondary | ICD-10-CM | POA: Diagnosis present

## 2021-09-13 DIAGNOSIS — J9600 Acute respiratory failure, unspecified whether with hypoxia or hypercapnia: Secondary | ICD-10-CM | POA: Diagnosis present

## 2021-09-13 DIAGNOSIS — E038 Other specified hypothyroidism: Secondary | ICD-10-CM | POA: Diagnosis not present

## 2021-09-13 DIAGNOSIS — N189 Chronic kidney disease, unspecified: Secondary | ICD-10-CM | POA: Diagnosis present

## 2021-09-13 DIAGNOSIS — I130 Hypertensive heart and chronic kidney disease with heart failure and stage 1 through stage 4 chronic kidney disease, or unspecified chronic kidney disease: Secondary | ICD-10-CM | POA: Diagnosis present

## 2021-09-13 DIAGNOSIS — I2510 Atherosclerotic heart disease of native coronary artery without angina pectoris: Secondary | ICD-10-CM | POA: Diagnosis present

## 2021-09-13 DIAGNOSIS — C8300 Small cell B-cell lymphoma, unspecified site: Secondary | ICD-10-CM | POA: Diagnosis present

## 2021-09-13 DIAGNOSIS — E039 Hypothyroidism, unspecified: Secondary | ICD-10-CM | POA: Diagnosis present

## 2021-09-13 DIAGNOSIS — F0390 Unspecified dementia without behavioral disturbance: Secondary | ICD-10-CM | POA: Diagnosis present

## 2021-09-13 LAB — COMPREHENSIVE METABOLIC PANEL
ALT: 12 U/L (ref 0–44)
AST: 22 U/L (ref 15–41)
Albumin: 2.5 g/dL — ABNORMAL LOW (ref 3.5–5.0)
Alkaline Phosphatase: 57 U/L (ref 38–126)
Anion gap: 9 (ref 5–15)
BUN: 23 mg/dL (ref 8–23)
CO2: 24 mmol/L (ref 22–32)
Calcium: 8.3 mg/dL — ABNORMAL LOW (ref 8.9–10.3)
Chloride: 105 mmol/L (ref 98–111)
Creatinine, Ser: 1.48 mg/dL — ABNORMAL HIGH (ref 0.44–1.00)
GFR, Estimated: 35 mL/min — ABNORMAL LOW (ref >60)
Glucose, Bld: 106 mg/dL — ABNORMAL HIGH (ref 70–99)
Potassium: 4.3 mmol/L (ref 3.5–5.1)
Sodium: 138 mmol/L (ref 135–145)
Total Bilirubin: 1.2 mg/dL (ref 0.3–1.2)
Total Protein: 5.8 g/dL — ABNORMAL LOW (ref 6.5–8.1)

## 2021-09-13 LAB — CBC WITH DIFFERENTIAL/PLATELET
Abs Immature Granulocytes: 0.11 10*3/uL — ABNORMAL HIGH (ref 0.00–0.07)
Basophils Absolute: 0 10*3/uL (ref 0.0–0.1)
Basophils Relative: 1 %
Eosinophils Absolute: 0 10*3/uL (ref 0.0–0.5)
Eosinophils Relative: 0 %
HCT: 24.3 % — ABNORMAL LOW (ref 36.0–46.0)
Hemoglobin: 7.1 g/dL — ABNORMAL LOW (ref 12.0–15.0)
Immature Granulocytes: 4 %
Lymphocytes Relative: 6 %
Lymphs Abs: 0.2 10*3/uL — ABNORMAL LOW (ref 0.7–4.0)
MCH: 27 pg (ref 26.0–34.0)
MCHC: 29.2 g/dL — ABNORMAL LOW (ref 30.0–36.0)
MCV: 92.4 fL (ref 80.0–100.0)
Monocytes Absolute: 0.3 10*3/uL (ref 0.1–1.0)
Monocytes Relative: 11 %
Neutro Abs: 2.2 10*3/uL (ref 1.7–7.7)
Neutrophils Relative %: 78 %
Platelets: 247 10*3/uL (ref 150–400)
RBC: 2.63 MIL/uL — ABNORMAL LOW (ref 3.87–5.11)
RDW: 18.1 % — ABNORMAL HIGH (ref 11.5–15.5)
WBC: 2.9 10*3/uL — ABNORMAL LOW (ref 4.0–10.5)
nRBC: 0 % (ref 0.0–0.2)

## 2021-09-13 LAB — C-REACTIVE PROTEIN: CRP: 19.7 mg/dL — ABNORMAL HIGH (ref <1.0)

## 2021-09-13 LAB — D-DIMER, QUANTITATIVE: D-Dimer, Quant: 1.09 ug/mL-FEU — ABNORMAL HIGH (ref 0.00–0.50)

## 2021-09-13 LAB — RESP PANEL BY RT-PCR (FLU A&B, COVID) ARPGX2
Influenza A by PCR: NEGATIVE
Influenza B by PCR: NEGATIVE
SARS Coronavirus 2 by RT PCR: POSITIVE — AB

## 2021-09-13 LAB — PROTIME-INR
INR: 3 — ABNORMAL HIGH (ref 0.8–1.2)
Prothrombin Time: 31.4 seconds — ABNORMAL HIGH (ref 11.4–15.2)

## 2021-09-13 LAB — LACTIC ACID, PLASMA
Lactic Acid, Venous: 1.4 mmol/L (ref 0.5–1.9)
Lactic Acid, Venous: 1 mmol/L (ref 0.5–1.9)

## 2021-09-13 LAB — APTT: aPTT: 32 seconds (ref 24–36)

## 2021-09-13 MED ORDER — SODIUM CHLORIDE 0.9 % IV BOLUS (SEPSIS)
1000.0000 mL | Freq: Once | INTRAVENOUS | Status: AC
  Administered 2021-09-13: 15:00:00 1000 mL via INTRAVENOUS

## 2021-09-13 MED ORDER — SODIUM CHLORIDE 0.9 % IV SOLN
200.0000 mg | Freq: Once | INTRAVENOUS | Status: DC
  Filled 2021-09-13: qty 40

## 2021-09-13 MED ORDER — SODIUM CHLORIDE 0.9 % IV SOLN
100.0000 mg | Freq: Every day | INTRAVENOUS | Status: DC
  Filled 2021-09-13: qty 20

## 2021-09-13 MED ORDER — ONDANSETRON HCL 4 MG PO TABS: 4.0000 mg | ORAL_TABLET | Freq: Four times a day (QID) | ORAL | Status: DC | PRN

## 2021-09-13 MED ORDER — FERROUS SULFATE 325 (65 FE) MG PO TABS
325.0000 mg | ORAL_TABLET | ORAL | Status: DC
  Administered 2021-09-14 – 2021-09-16 (×2): 325 mg via ORAL
  Filled 2021-09-13 (×3): qty 1

## 2021-09-13 MED ORDER — SODIUM CHLORIDE 0.9% FLUSH
3.0000 mL | Freq: Two times a day (BID) | INTRAVENOUS | Status: DC
  Administered 2021-09-13 – 2021-09-15 (×4): 3 mL via INTRAVENOUS

## 2021-09-13 MED ORDER — SODIUM CHLORIDE 0.9 % IV BOLUS (SEPSIS)
1000.0000 mL | Freq: Once | INTRAVENOUS | Status: AC
  Administered 2021-09-13: 18:00:00 1000 mL via INTRAVENOUS

## 2021-09-13 MED ORDER — LEVOTHYROXINE SODIUM 50 MCG PO TABS
50.0000 ug | ORAL_TABLET | Freq: Every day | ORAL | Status: DC
Start: 2021-09-14 — End: 2021-09-16
  Administered 2021-09-14 – 2021-09-16 (×3): 50 ug via ORAL
  Filled 2021-09-13 (×3): qty 1

## 2021-09-13 MED ORDER — CARBOXYMETHYLCELLULOSE SODIUM 0.5 % OP SOLN: 1.0000 [drp] | Freq: Three times a day (TID) | OPHTHALMIC | Status: DC | PRN

## 2021-09-13 MED ORDER — SENNA 8.6 MG PO TABS: 1.0000 | ORAL_TABLET | Freq: Two times a day (BID) | ORAL | Status: DC | PRN

## 2021-09-13 MED ORDER — ASCORBIC ACID 500 MG PO TABS
500.0000 mg | ORAL_TABLET | Freq: Every day | ORAL | Status: DC
  Administered 2021-09-13 – 2021-09-16 (×4): 500 mg via ORAL
  Filled 2021-09-13 (×3): qty 1

## 2021-09-13 MED ORDER — LACTATED RINGERS IV SOLN: INTRAVENOUS | Status: AC

## 2021-09-13 MED ORDER — SODIUM CHLORIDE 0.9 % IV SOLN
2.0000 g | INTRAVENOUS | Status: DC
  Administered 2021-09-13 – 2021-09-15 (×3): 2 g via INTRAVENOUS
  Filled 2021-09-13 (×2): qty 20
  Filled 2021-09-13 (×2): qty 2

## 2021-09-13 MED ORDER — PREDNISONE 50 MG PO TABS: 50.0000 mg | ORAL_TABLET | Freq: Every day | ORAL | Status: DC

## 2021-09-13 MED ORDER — METHYLPREDNISOLONE SODIUM SUCC 40 MG IJ SOLR
0.5000 mg/kg | Freq: Two times a day (BID) | INTRAMUSCULAR | Status: DC
  Administered 2021-09-13 – 2021-09-14 (×3): 36.4 mg via INTRAVENOUS
  Filled 2021-09-13 (×3): qty 1

## 2021-09-13 MED ORDER — ACETAMINOPHEN 500 MG PO TABS: 500.0000 mg | ORAL_TABLET | Freq: Four times a day (QID) | ORAL | Status: DC | PRN

## 2021-09-13 MED ORDER — SODIUM CHLORIDE 0.9% FLUSH: 3.0000 mL | INTRAVENOUS | Status: DC | PRN

## 2021-09-13 MED ORDER — ONDANSETRON HCL 4 MG/2ML IJ SOLN: 4.0000 mg | Freq: Four times a day (QID) | INTRAMUSCULAR | Status: DC | PRN

## 2021-09-13 MED ORDER — GUAIFENESIN-DM 100-10 MG/5ML PO SYRP: 10.0000 mL | ORAL_SOLUTION | ORAL | Status: DC | PRN

## 2021-09-13 MED ORDER — ALBUTEROL SULFATE HFA 108 (90 BASE) MCG/ACT IN AERS
2.0000 | INHALATION_SPRAY | Freq: Four times a day (QID) | RESPIRATORY_TRACT | Status: DC
  Administered 2021-09-13 – 2021-09-16 (×7): 2 via RESPIRATORY_TRACT
  Filled 2021-09-13 (×2): qty 6.7

## 2021-09-13 MED ORDER — SODIUM CHLORIDE 0.9 % IV SOLN: 250.0000 mL | INTRAVENOUS | Status: DC | PRN

## 2021-09-13 MED ORDER — SODIUM CHLORIDE 0.9 % IV SOLN
500.0000 mg | INTRAVENOUS | Status: DC
  Administered 2021-09-13 – 2021-09-14 (×2): 500 mg via INTRAVENOUS
  Filled 2021-09-13: qty 5
  Filled 2021-09-13: qty 500
  Filled 2021-09-13: qty 5

## 2021-09-13 MED ORDER — SODIUM CHLORIDE 0.9 % IV SOLN: 500.0000 mg | INTRAVENOUS | Status: DC

## 2021-09-13 MED ORDER — ZINC SULFATE 220 (50 ZN) MG PO CAPS
220.0000 mg | ORAL_CAPSULE | Freq: Every day | ORAL | Status: DC
  Administered 2021-09-13 – 2021-09-16 (×4): 220 mg via ORAL
  Filled 2021-09-13 (×3): qty 1

## 2021-09-13 MED ORDER — POLYVINYL ALCOHOL 1.4 % OP SOLN
1.0000 [drp] | OPHTHALMIC | Status: DC | PRN
  Filled 2021-09-13: qty 15

## 2021-09-13 NOTE — ED Provider Notes (Signed)
[facility] Emergency Department Provider Note ____________________________________________   Event Date/Time   First MD Initiated Contact with Patient 09/13/21 1335     (approximate)  I have reviewed the triage vital signs and the nursing notes.  HISTORY  Chief Complaint Shortness of Breath   HPI Penny White is a 86 y.o. femalewho presents to the ED for evaluation of worsening dyspnea.   Chart review indicates CKD and history of anemia of chronic disease.  She presents to the ED via EMS from her local SNF for evaluation of worsening dyspnea.  She reportedly tested positive for COVID about 2 weeks ago, she tells me that she has been feeling worse over the past few days with increased cough and dyspnea.  She reports poor intake and feeling weak overall.  Denies any pain such as chest pain or abdominal pain.  Denies syncope, injuries or falls.  She was found to be hypoxic with EMS with room air sats of 83-85% requiring 3 L nasal cannula.  This is new for her.  Past Medical History:  Diagnosis Date   Actinic keratosis    Arthritis    Atherosclerosis    Chronic heart failure with preserved ejection fraction (HFpEF) ([place])    a. 01/2020 Echo: EF >55%, mild LVH, nl RV fxn, Mild AI/TR, triv MR/PR; b. 07/2021 Echo: EF 60-65%, no rwma, nl RV fxn. Mildly dil LA. Mild MR/AI. Asc Ao 45mm.   CKD (chronic kidney disease), stage IV (HCC)    Dementia (HCC)    Dyspnea    Encephalitis 2020   Gout    Hyperlipemia    Hypertension    Hypothyroid    Marginal zone lymphoma (HCC)    MGUS (monoclonal gammopathy of unknown significance)    Nonobstructive Coronary artery disease    a. 2016 MV: very mild ant ischemia; b. 2019 MV: worsening ant ischemia; c 2019 Cath: nonobs dzs-->Med rx.   Permanent atrial fibrillation (HCC)    a. CHA2DS2VASc = 7-->eliquis.   Polymyalgia rheumatica (HCC)    Shingles 2020   Small cell B-cell lymphoma, unspecified site ([place])    Squamous  cell carcinoma of skin    L arm removed by Dr Phillip Heal    Squamous cell carcinoma of skin 08/20/2021   R post shoulder - ED&C   Stroke ([place]) 2018   + 2021.  Peripheral vision damage.   Subdural hemorrhage ([place]) 2020    Patient Active Problem List   Diagnosis Date Noted   Acute respiratory failure ([place]) 09/13/2021   Dehydration 08/02/2021   Persistent atrial fibrillation ([place]) 08/02/2021   Hypotension    Elevated troponin 07/31/2021   Essential hypertension 07/31/2021   Chronic anticoagulation 07/31/2021   Acute kidney injury superimposed on CKD IV ([place]) 07/31/2021   Elevated LFTs 07/31/2021   Generalized weakness 07/31/2021   Polymyalgia rheumatica ([place]) 07/31/2021   Coronary artery disease    Small cell B-cell lymphoma, unspecified site [facility])    Hypothyroid    Current chronic use of systemic steroids    Elevated lactic acid level    UTI (urinary tract infection)    Bradycardia    Lactic acidosis    Anemia in chronic kidney disease 07/16/2020   Chronic kidney disease, stage IV (severe) ([place]) 06/18/2020   Chronic atrial fibrillation ([place]) 01/22/2017   Personal history of transient ischemic attack (TIA), and cerebral infarction without residual deficits 09/27/2016   Heart failure with preserved left ventricular function (HFpEF) ([place]) 06/25/2015   MGUS (monoclonal  gammopathy of unknown significance) 05/07/2012    Past Surgical History:  Procedure Laterality Date   CARDIAC CATHETERIZATION     CATARACT EXTRACTION W/PHACO Left 12/04/2020   Procedure: CATARACT EXTRACTION PHACO AND INTRAOCULAR LENS PLACEMENT (IOC) LEFT 6.95 00:45.9;  Surgeon: Birder Robson, MD;  Location: [facility];  Service: Ophthalmology;  Laterality: Left;   CATARACT EXTRACTION W/PHACO Right 12/18/2020   Procedure: CATARACT EXTRACTION PHACO AND INTRAOCULAR LENS PLACEMENT (IOC) RIGHT 7.07 00:42.9;  Surgeon: Birder Robson, MD;  Location: [facility];  Service: Ophthalmology;  Laterality:  Right;   CHOLECYSTECTOMY  1978   COLONOSCOPY     LYMPH NODE BIOPSY N/A 05/17/2021   Procedure: RIGHT OCCIPITAL LYMPH NODE BIOPSY;  Surgeon: Robert Bellow, MD;  Location: [facility];  Service: General;  Laterality: N/A;  occipital node biopsy possible axillary node biopsy   REPLACEMENT TOTAL KNEE Bilateral    Right 06/02/03,  left 04/20/06   TUBAL LIGATION      Prior to Admission medications   Medication Sig Start Date End Date Taking? Authorizing Provider  acetaminophen (TYLENOL) 500 MG tablet Take 1 tablet (500 mg total) by mouth every 4 (four) hours as needed for moderate pain. Patient taking differently: Take 500 mg by mouth every 6 (six) hours as needed for moderate pain. 05/17/21  Yes Byrnett, Forest Gleason, MD  apixaban (ELIQUIS) 2.5 MG TABS tablet Take 2 tablets (5 mg total) by mouth 2 (two) times daily. 08/03/21  Yes Richarda Osmond, MD  carboxymethylcellulose (REFRESH PLUS) 0.5 % SOLN Place 1 drop into both eyes 3 (three) times daily as needed (dry eyes).   Yes [provider]  ferrous sulfate 325 (65 FE) MG tablet Take 325 mg by mouth every other day.   Yes [provider]  levothyroxine (SYNTHROID) 50 MCG tablet Take 50 mcg by mouth daily before breakfast.   Yes [provider]  Multiple Vitamins-Iron (MULTIVITAMIN/IRON PO) Take 1 tablet by mouth daily.   Yes [provider]  predniSONE (DELTASONE) 5 MG tablet Take 5 mg by mouth daily with breakfast.   Yes [provider]  senna (SENOKOT) 8.6 MG tablet Take 1 tablet (8.6 mg total) by mouth 2 (two) times daily as needed for constipation. 08/03/21  Yes Richarda Osmond, MD    Allergies Ciprofloxacin and Novocain [procaine]  No family history on file.  Social History Social History   Tobacco Use   Smoking status: Never   Smokeless tobacco: Never  Vaping Use   Vaping Use: Never used  Substance Use Topics   Alcohol use: Yes    Comment: occasional - wine small amount   Drug  use: Not Currently    Review of Systems  Constitutional: No fever/chills.  Positive generalized weakness Eyes: No visual changes. ENT: No sore throat. Cardiovascular: Denies chest pain. Respiratory: Positive for dyspnea and cough Gastrointestinal: No abdominal pain.  No nausea, no vomiting.  No diarrhea.  No constipation. Positive for poor p.o. intake Genitourinary: Negative for dysuria. Musculoskeletal: Negative for back pain. Skin: Negative for rash. Neurological: Negative for headaches, focal weakness or numbness. ____________________________________________   PHYSICAL EXAM:  VITAL SIGNS: Vitals:   09/13/21 1345 09/13/21 1458  BP:  (!) 129/50  Pulse:  61  Resp:  (!) 24  Temp:    SpO2: 95% 99%     Constitutional: Alert and oriented. Well appearing and in no acute distress. Eyes: Conjunctivae are normal. PERRL. EOMI. Head: Atraumatic. Nose: No congestion/rhinnorhea. Mouth/Throat: Mucous membranes are moist.  Oropharynx non-erythematous. Neck: No stridor. No cervical spine tenderness to palpation. Cardiovascular: Normal rate, regular rhythm. Grossly normal heart sounds.  Good peripheral circulation. Respiratory: Tachypneic to nearly 30.  No retractions or distress.  No wheezing. Gastrointestinal: Soft , nondistended, nontender to palpation. No CVA tenderness. Musculoskeletal: No lower extremity tenderness   No joint effusions. No signs of acute trauma. Neurologic:  Normal speech and language. No gross focal neurologic deficits are appreciated.  Skin:  Skin is warm, dry and intact. No rash noted. Psychiatric: Mood and affect are normal. Speech and behavior are normal. ____________________________________________   LABS (all labs ordered are listed, but only abnormal results are displayed)  Labs Reviewed  COMPREHENSIVE METABOLIC PANEL - Abnormal; Notable for the following components:      Result Value   Glucose, Bld 106 (*)    Creatinine, Ser 1.48 (*)    Calcium  8.3 (*)    Total Protein 5.8 (*)    Albumin 2.5 (*)    GFR, Estimated 35 (*)    All other components within normal limits  CBC WITH DIFFERENTIAL/PLATELET - Abnormal; Notable for the following components:   WBC 2.9 (*)    RBC 2.63 (*)    Hemoglobin 7.1 (*)    HCT 24.3 (*)    MCHC 29.2 (*)    RDW 18.1 (*)    Lymphs Abs 0.2 (*)    Abs Immature Granulocytes 0.11 (*)    All other components within normal limits  PROTIME-INR - Abnormal; Notable for the following components:   Prothrombin Time 31.4 (*)    INR 3.0 (*)    All other components within normal limits  CULTURE, BLOOD (ROUTINE X 2)  CULTURE, BLOOD (ROUTINE X 2)  URINE CULTURE  RESP PANEL BY RT-PCR (FLU A&B, COVID) ARPGX2  LACTIC ACID, PLASMA  APTT  LACTIC ACID, PLASMA  URINALYSIS, COMPLETE (UACMP) WITH MICROSCOPIC  TYPE AND SCREEN   ____________________________________________  12 Lead EKG  Poor quality EKG with undetermined rhythm.  Possibly atrial fibrillation with a rate of 68 bpm.  Narrow complex.  No STEMI. ____________________________________________  RADIOLOGY  ED MD interpretation: 1 view CXR reviewed by me with multifocal opacities concerning for pneumonia.  Official radiology report(s): DG Chest Port 1 View  Result Date: 09/13/2021 CLINICAL DATA:  Hypoxia, COVID EXAM: PORTABLE CHEST 1 VIEW COMPARISON:  07/31/2021 FINDINGS: Multifocal patchy opacities in the lungs bilaterally, most prominent in the left upper lobe. Small layering right pleural effusion. No pneumothorax. Mild cardiomegaly.  Thoracic aortic atherosclerosis. IMPRESSION: Multifocal pneumonia in this patient with known COVID. Small right pleural effusion. Electronically Signed   By: Julian Hy M.D.   On: 09/13/2021 14:36    ____________________________________________   PROCEDURES and INTERVENTIONS  Procedure(s) performed (including Critical Care):  .1-3 Lead EKG Interpretation Performed by: Vladimir Crofts, MD Authorized by: Vladimir Crofts, MD     Interpretation: normal     ECG rate:  70   ECG rate assessment: normal     Rhythm: other rhythm     Ectopy: none     Conduction: normal   .Critical Care Performed by: Vladimir Crofts, MD Authorized by: Vladimir Crofts, MD   Critical care provider statement:    Critical care time (minutes):  30   Critical care time was exclusive of:  Separately billable procedures and treating other patients   Critical care was necessary to treat or prevent imminent or life-threatening deterioration of the following conditions:  Sepsis   Critical care was time spent personally  by me on the following activities:  Development of treatment plan with patient or surrogate, discussions with consultants, evaluation of patient's response to treatment, examination of patient, ordering and review of laboratory studies, ordering and review of radiographic studies, ordering and performing treatments and interventions, pulse oximetry, re-evaluation of patient's condition and review of old charts  Medications  lactated ringers infusion (has no administration in time range)  sodium chloride 0.9 % bolus 1,000 mL (1,000 mLs Intravenous New Bag/Given 09/13/21 1459)    And  sodium chloride 0.9 % bolus 1,000 mL (has no administration in time range)  cefTRIAXone (ROCEPHIN) 2 g in sodium chloride 0.9 % 100 mL IVPB (2 g Intravenous New Bag/Given 09/13/21 1459)  azithromycin (ZITHROMAX) 500 mg in sodium chloride 0.9 % 250 mL IVPB (has no administration in time range)    ____________________________________________   MDM / ED COURSE   86 year old female diagnosed about 2 weeks ago with COVID at her facility, presents to the ED with worsening respiratory symptoms with evidence of sepsis due to superimposed bacterial CAP.  She is tachypneic and hypoxic on room air necessitating 3 L nasal cannula.  Hemodynamically stable without signs of shock or septic shock.Blood work demonstrates slight worsening of her anemia of  chronic disease.  CKD around baseline.  No indications for emergent transfusions at this time.  CXR with no infiltrates further concerning for CAP.  We will get her started on sepsis protocols for pneumonia and admit to medicine for further work-up and management.     ____________________________________________   FINAL CLINICAL IMPRESSION(S) / ED DIAGNOSES  Final diagnoses:  Sepsis with acute hypoxic respiratory failure without septic shock, due to unspecified organism [facility])     ED Discharge Orders     None        Brier Reid   Note:  This document was prepared using Dragon voice recognition software and may include unintentional dictation errors.    Vladimir Crofts, MD 09/13/21 [phone]

## 2021-09-13 NOTE — H&P (Signed)
History and Physical    Penny White PJK:[id number] DOB: 28-Oct-1934 DOA: 09/13/2021  PCP: Leonel Ramsay, MD   Patient coming from: [facility] (assisted living facility)  I have personally briefly reviewed patient's old medical records in [place]  Chief Complaint: Shortness of breath  HPI: Penny White is a 86 y.o. female with medical history significant for chronic diastolic dysfunction CHF with last known LVEF of 55%, stage IV chronic kidney disease, hypertension, hypothyroidism, marginal zone lymphoma, permanent atrial fibrillation who presents to the emergency room via EMS for evaluation of worsening shortness of breath with exertion as well as a productive cough. Patient had COVID-19 viral infection about 2 weeks ago and was placed on quarantine at their assisted living facility.  She was treated with Molnupiravir as an outpatient but did not require oxygen supplementation. Over the last couple of days she has developed worsening shortness of breath mostly with exertion and has a wet sounding cough which is productive but she is unable to tell me the color of the phlegm.  She has had chills but denies having any fever.  She was hypoxic with room air pulse oximetry of 80% and is currently on 3 L of oxygen to maintain pulse oximetry of about 94%. Her primary care provider recently called in a Z-Pak and patient has only taken 1 tablet. She has bilateral lower extremity swelling which is chronic. She denies having any chest pain, no nausea, no vomiting, no dizziness, no lightheadedness, no abdominal pain, no changes in her bowel habits, no urinary symptoms, no blurred vision no focal deficit. Sodium 138, potassium 4.3, chloride 105, bicarb 24, glucose 106, BUN 23, creatinine 1.48, calcium 8.3, alkaline phosphatase 57, albumin 2.5, AST 22, ALT 12, total protein 5.8, total bilirubin 1.2, lactic acid 1.4, white count 2.9, hemoglobin 7.1, hematocrit 24.3, MCV 92.4,  RDW 18.1, platelet count 247, PT 31.4, INR 3.0, Chest x-ray reviewed by me shows multifocal pneumonia.  Small right pleural effusion.  ED Course: Patient is an 86 year old female who presents to the ER for evaluation of exertional shortness of breath and a productive cough with associated hypoxia.  Patient had room air pulse oximetry of 80% and is currently on 3 L of oxygen to maintain pulse oximetry of about 94%. She was recently treated for COVID-19 viral infection with Molnupiravir. She will be admitted to the hospital for further evaluation.    Review of Systems: As per HPI otherwise all other systems reviewed and negative.    Past Medical History:  Diagnosis Date   Actinic keratosis    Arthritis    Atherosclerosis    Chronic heart failure with preserved ejection fraction (HFpEF) ([place])    a. 01/2020 Echo: EF >55%, mild LVH, nl RV fxn, Mild AI/TR, triv MR/PR; b. 07/2021 Echo: EF 60-65%, no rwma, nl RV fxn. Mildly dil LA. Mild MR/AI. Asc Ao 97mm.   CKD (chronic kidney disease), stage IV (HCC)    Dementia (HCC)    Dyspnea    Encephalitis 2020   Gout    Hyperlipemia    Hypertension    Hypothyroid    Marginal zone lymphoma (HCC)    MGUS (monoclonal gammopathy of unknown significance)    Nonobstructive Coronary artery disease    a. 2016 MV: very mild ant ischemia; b. 2019 MV: worsening ant ischemia; c 2019 Cath: nonobs dzs-->Med rx.   Permanent atrial fibrillation (HCC)    a. CHA2DS2VASc = 7-->[facility].   Polymyalgia rheumatica ([place])  Shingles 2020   Small cell B-cell lymphoma, unspecified site ([place])    Squamous cell carcinoma of skin    L arm removed by Dr Phillip Heal    Squamous cell carcinoma of skin 08/20/2021   R post shoulder - ED&C   Stroke ([place]) 2018   + 2021.  Peripheral vision damage.   Subdural hemorrhage ([place]) 2020    Past Surgical History:  Procedure Laterality Date   CARDIAC CATHETERIZATION     CATARACT EXTRACTION W/PHACO Left 12/04/2020   Procedure: CATARACT  EXTRACTION PHACO AND INTRAOCULAR LENS PLACEMENT (IOC) LEFT 6.95 00:45.9;  Surgeon: Birder Robson, MD;  Location: [facility];  Service: Ophthalmology;  Laterality: Left;   CATARACT EXTRACTION W/PHACO Right 12/18/2020   Procedure: CATARACT EXTRACTION PHACO AND INTRAOCULAR LENS PLACEMENT (IOC) RIGHT 7.07 00:42.9;  Surgeon: Birder Robson, MD;  Location: [place] [facility];  Service: Ophthalmology;  Laterality: Right;   CHOLECYSTECTOMY  1978   COLONOSCOPY     LYMPH NODE BIOPSY N/A 05/17/2021   Procedure: RIGHT OCCIPITAL LYMPH NODE BIOPSY;  Surgeon: Robert Bellow, MD;  Location: [facility];  Service: General;  Laterality: N/A;  occipital node biopsy possible axillary node biopsy   REPLACEMENT TOTAL KNEE Bilateral    Right 06/02/03,  left 04/20/06   TUBAL LIGATION       reports that she has never smoked. She has never used smokeless tobacco. She reports current alcohol use. She reports that she does not currently use drugs.  Allergies  Allergen Reactions   Ciprofloxacin Itching   Novocain [Procaine]     Doesn't remember reaction    No family history on file.  Patient states family is deceased   Prior to Admission medications   Medication Sig Start Date End Date Taking? Authorizing Provider  acetaminophen (TYLENOL) 500 MG tablet Take 1 tablet (500 mg total) by mouth every 4 (four) hours as needed for moderate pain. Patient taking differently: Take 500 mg by mouth every 6 (six) hours as needed for moderate pain. 05/17/21  Yes Byrnett, Forest Gleason, MD  apixaban (ELIQUIS) 2.5 MG TABS tablet Take 2 tablets (5 mg total) by mouth 2 (two) times daily. 08/03/21  Yes Richarda Osmond, MD  carboxymethylcellulose (REFRESH PLUS) 0.5 % SOLN Place 1 drop into both eyes 3 (three) times daily as needed (dry eyes).   Yes [provider]  ferrous sulfate 325 (65 FE) MG tablet Take 325 mg by mouth every other day.   Yes [provider]  levothyroxine (SYNTHROID) 50 MCG tablet  Take 50 mcg by mouth daily before breakfast.   Yes [provider]  Multiple Vitamins-Iron (MULTIVITAMIN/IRON PO) Take 1 tablet by mouth daily.   Yes [provider]  predniSONE (DELTASONE) 5 MG tablet Take 5 mg by mouth daily with breakfast.   Yes [provider]  senna (SENOKOT) 8.6 MG tablet Take 1 tablet (8.6 mg total) by mouth 2 (two) times daily as needed for constipation. 08/03/21  Yes Richarda Osmond, MD    Physical Exam: Vitals:   09/13/21 1340 09/13/21 1345 09/13/21 1458  BP: (!) 142/53  (!) 129/50  Pulse: 71  61  Resp: (!) 27  (!) 24  Temp: 98.4 F (36.9 C)    TempSrc: Oral    SpO2: (!) 85% 95% 99%  Weight:  73 kg   Height:  5\' 2"  (1.575 m)      Vitals:   09/13/21 1340 09/13/21 1345 09/13/21 1458  BP: (!) 142/53  (!) 129/50  Pulse: 71  61  Resp: (!) 27  (!) 24  Temp: 98.4 F (36.9 C)    TempSrc: Oral    SpO2: (!) 85% 95% 99%  Weight:  73 kg   Height:  5\' 2"  (1.575 m)       Constitutional: Alert and oriented x 3 . Not in any apparent distress.  Chronically ill-appearing HEENT:      Head: Normocephalic and atraumatic.         Eyes: PERLA, EOMI, Conjunctiva pallor. Sclera is non-icteric.       Mouth/Throat: Mucous membranes are moist.       Neck: Supple with no signs of meningismus. Cardiovascular: Irregularly irregular. No murmurs, gallops, or rubs. 2+ symmetrical distal pulses are present . No JVD. 2+ LE edema Respiratory: Tachypneic.scattered rhonchi in both lung fields bilaterally. No wheezes or crackles  Gastrointestinal: Soft, non tender, and non distended with positive bowel sounds.  Genitourinary: No CVA tenderness. Musculoskeletal: Nontender with normal range of motion in all extremities. No cyanosis, or erythema of extremities. Neurologic:  Face is symmetric. Moving all extremities. No gross focal neurologic deficits .  Generalized weakness Skin: Skin is warm, dry.  No rash or ulcers Psychiatric: Mood and affect are  normal    Labs on Admission: I have personally reviewed following labs and imaging studies  CBC: Recent Labs  Lab 09/13/21 1404  WBC 2.9*  NEUTROABS 2.2  HGB 7.1*  HCT 24.3*  MCV 92.4  PLT 128   Basic Metabolic Panel: Recent Labs  Lab 09/13/21 1404  NA 138  K 4.3  CL 105  CO2 24  GLUCOSE 106*  BUN 23  CREATININE 1.48*  CALCIUM 8.3*   GFR: Estimated Creatinine Clearance: 26.5 mL/min (A) (by C-G formula based on SCr of 1.48 mg/dL (H)). Liver Function Tests: Recent Labs  Lab 09/13/21 1404  AST 22  ALT 12  ALKPHOS 57  BILITOT 1.2  PROT 5.8*  ALBUMIN 2.5*   No results for input(s): LIPASE, AMYLASE in the last 168 hours. No results for input(s): AMMONIA in the last 168 hours. Coagulation Profile: Recent Labs  Lab 09/13/21 1404  INR 3.0*   Cardiac Enzymes: No results for input(s): CKTOTAL, CKMB, CKMBINDEX, TROPONINI in the last 168 hours. BNP (last 3 results) No results for input(s): PROBNP in the last 8760 hours. HbA1C: No results for input(s): HGBA1C in the last 72 hours. CBG: No results for input(s): GLUCAP in the last 168 hours. Lipid Profile: No results for input(s): CHOL, HDL, LDLCALC, TRIG, CHOLHDL, LDLDIRECT in the last 72 hours. Thyroid Function Tests: No results for input(s): TSH, T4TOTAL, FREET4, T3FREE, THYROIDAB in the last 72 hours. Anemia Panel: No results for input(s): VITAMINB12, FOLATE, FERRITIN, TIBC, IRON, RETICCTPCT in the last 72 hours. Urine analysis:    Component Value Date/Time   COLORURINE STRAW (A) 07/31/2021 1652   APPEARANCEUR CLEAR (A) 07/31/2021 1652   APPEARANCEUR Hazy (A) 04/26/2021 1117   LABSPEC 1.006 07/31/2021 1652   LABSPEC 1.019 05/16/2013 1324   PHURINE 6.0 07/31/2021 1652   GLUCOSEU NEGATIVE 07/31/2021 1652   GLUCOSEU Negative 05/16/2013 1324   HGBUR LARGE (A) 07/31/2021 1652   BILIRUBINUR NEGATIVE 07/31/2021 1652   BILIRUBINUR Negative 04/26/2021 1117   BILIRUBINUR Negative 05/16/2013 [place] 07/31/2021 1652   PROTEINUR 30 (A) 07/31/2021 1652   NITRITE NEGATIVE 07/31/2021 1652   LEUKOCYTESUR SMALL (A) 07/31/2021 1652   LEUKOCYTESUR Negative 05/16/2013 1324    Radiological Exams on Admission: [facility] Chest [place]  1 View  Result Date: 09/13/2021 CLINICAL DATA:  Hypoxia, COVID EXAM: PORTABLE CHEST 1 VIEW COMPARISON:  07/31/2021 FINDINGS: Multifocal patchy opacities in the lungs bilaterally, most prominent in the left upper lobe. Small layering right pleural effusion. No pneumothorax. Mild cardiomegaly.  Thoracic aortic atherosclerosis. IMPRESSION: Multifocal pneumonia in this patient with known COVID. Small right pleural effusion. Electronically Signed   By: Julian Hy M.D.   On: 09/13/2021 14:36     Assessment/Plan Principal Problem:   Acute respiratory failure (HCC) Active Problems:   Chronic kidney disease, stage IV (severe) (HCC)   Chronic atrial fibrillation (HCC)   Essential hypertension   Heart failure with preserved left ventricular function (HFpEF) (HCC)   Anemia in chronic kidney disease   Coronary artery disease   Small cell B-cell lymphoma, unspecified site (HCC)   Hypothyroid   Persistent atrial fibrillation (HCC)   CAP (community acquired pneumonia)   Pneumonia due to COVID-56 virus     Patient is an 86 year old female admitted to the hospital for acute respiratory failure from community-acquired pneumonia.    Acute respiratory failure From COVID-pneumonia with superimposed bacterial pneumonia  Patient is tachypneic and has increased work of breathing with room air pulse oximetry of 80% and is currently on 3 L of oxygen to maintain pulse oximetry of 94% High cycle threshold is 22.7 Patient most likely has a rebound COVID-19 viral infection.  Her initial positive test was about 2 weeks ago and she was treated with Molnupiravir which she completed. Will place patient on remdesivir per protocol Will place patient empirically on IV Rocephin and  Zithromax Supportive care with antitussives and bronchodilator therapy    Anemia of chronic kidney disease Patient's hemoglobin is 7.1 down from 8 about 1 month ago. No evidence of acute bleeding Will hold Eliquis for now We will type and screen and will transfuse for hemoglobin less than 7g/dl    Stage IV chronic kidney disease Renal function is stable Monitor closely during this hospitalization     Hypothyroidism Continue Synthroid    Permanent A. Fib Hold apixaban for now due to worsening anemia that may require blood transfusion    DVT prophylaxis: SCD  Code Status: DO NOT RESUSCITATE Family Communication: Greater than 50% of time was spent discussing patient's condition and plan of care with her son who was at the bedside.  All questions and concerns have been addressed.  CODE STATUS was discussed and she is a DO NOT RESUSCITATE. Disposition Plan: Back to previous home environment Consults called: none  Status:At the time of admission, it appears that the appropriate admission status for this patient is inpatient. This is judged to be reasonable and necessary to provide the required intensity of service to ensure the patient's safety given the presenting symptoms, physical exam findings, and initial radiographic and laboratory data in the context of their comorbid conditions. Patient requires inpatient status due to high intensity of service, high risk for further deterioration and high frequency of surveillance required.     Collier Bullock MD [facility]     09/13/2021, 4:14 PM

## 2021-09-13 NOTE — ED Triage Notes (Signed)
Pt to ER via [facility] from [facility] (assisted living). Ems were called out due to patient oxygen saturations being 80% on room air, patient has also been refusing care at the facility today. Was recently placed on zithromax.   Patient on room air at baseline. Coughing and tachypneic on arrival. Room air sats 85% in ER.  Pt had covid approx 15 days ago.

## 2021-09-13 NOTE — ED Notes (Signed)
Lab at bedside to collect outstanding orders.

## 2021-09-13 NOTE — ED Notes (Signed)
Request made for transport to the floor ?

## 2021-09-13 NOTE — ED Notes (Signed)
This RN was contacted by patient's daughter in law in regards to antiviral medications for covid, reports the possibly of restarting the previously prescribed medication that she took several weeks ago by dr fitzgerald would be possibly not beneficial because of risk of kidney failure. Family want communication with other doctors about this, if the benefits of restarting this medication could help with covid symptoms or cause a further decline of kidney function. This RN stated she would write this in a note for continuity of care.

## 2021-09-13 NOTE — ED Notes (Signed)
Patient's son requesting to speak with Md regarding Remdesivir order. At this time, patient's son believes risks of potential kidney damage to preexisting stage 4 kidney failure will outweigh the benefits.   Patient given phone to speak with MD Agbata regarding decision.

## 2021-09-13 NOTE — Progress Notes (Signed)
CODE SEPSIS - PHARMACY COMMUNICATION  **Broad Spectrum Antibiotics should be administered within 1 hour of Sepsis diagnosis**  Time Code Sepsis Called/Page Received: 1447  Antibiotics Ordered: azithromycin and ceftriaxone  Time of 1st antibiotic administration: [place] ,PharmD Clinical Pharmacist  09/13/2021  2:47 PM

## 2021-09-13 NOTE — Progress Notes (Signed)
Remdesivir - Pharmacy Brief Note   O:  ALT: 12 CXR: Multifocal pneumonia in this patient with known COVID. Small right pleural effusion SpO2: 99% on 3L [place]   A/P:  Remdesivir 200 mg IVPB once followed by 100 mg IVPB daily x 4 days.   Sherilyn Banker, PharmD Clinical Pharmacist  09/13/2021 4:12 PM

## 2021-09-13 NOTE — Progress Notes (Signed)
12/23 Elink is following sepsis bundle.

## 2021-09-14 ENCOUNTER — Encounter: Payer: Self-pay | Admitting: Internal Medicine

## 2021-09-14 ENCOUNTER — Encounter: Payer: Self-pay | Admitting: Oncology

## 2021-09-14 DIAGNOSIS — N189 Chronic kidney disease, unspecified: Secondary | ICD-10-CM

## 2021-09-14 DIAGNOSIS — J189 Pneumonia, unspecified organism: Secondary | ICD-10-CM

## 2021-09-14 DIAGNOSIS — D631 Anemia in chronic kidney disease: Secondary | ICD-10-CM

## 2021-09-14 DIAGNOSIS — J9600 Acute respiratory failure, unspecified whether with hypoxia or hypercapnia: Secondary | ICD-10-CM | POA: Diagnosis not present

## 2021-09-14 DIAGNOSIS — N184 Chronic kidney disease, stage 4 (severe): Secondary | ICD-10-CM

## 2021-09-14 LAB — URINALYSIS, COMPLETE (UACMP) WITH MICROSCOPIC
Bilirubin Urine: NEGATIVE
Glucose, UA: NEGATIVE mg/dL
Ketones, ur: 15 mg/dL — AB
Nitrite: NEGATIVE
Protein, ur: 100 mg/dL — AB
Specific Gravity, Urine: 1.02 (ref 1.005–1.030)
WBC, UA: >50 WBC/hpf — ABNORMAL HIGH (ref 0–5)
pH: 5.5 (ref 5.0–8.0)

## 2021-09-14 LAB — MRSA NEXT GEN BY PCR, NASAL: MRSA by PCR Next Gen: NOT DETECTED

## 2021-09-14 LAB — BASIC METABOLIC PANEL
Anion gap: 8 (ref 5–15)
BUN: 24 mg/dL — ABNORMAL HIGH (ref 8–23)
CO2: 22 mmol/L (ref 22–32)
Calcium: 7.6 mg/dL — ABNORMAL LOW (ref 8.9–10.3)
Chloride: 110 mmol/L (ref 98–111)
Creatinine, Ser: 1.21 mg/dL — ABNORMAL HIGH (ref 0.44–1.00)
GFR, Estimated: 44 mL/min — ABNORMAL LOW (ref >60)
Glucose, Bld: 69 mg/dL — ABNORMAL LOW (ref 70–99)
Potassium: 4.2 mmol/L (ref 3.5–5.1)
Sodium: 140 mmol/L (ref 135–145)

## 2021-09-14 LAB — ABO/RH: ABO/RH(D): O POS

## 2021-09-14 LAB — HEMOGLOBIN AND HEMATOCRIT, BLOOD
HCT: 30.1 % — ABNORMAL LOW (ref 36.0–46.0)
Hemoglobin: 9.2 g/dL — ABNORMAL LOW (ref 12.0–15.0)

## 2021-09-14 LAB — CBC
HCT: 22.2 % — ABNORMAL LOW (ref 36.0–46.0)
Hemoglobin: 6.7 g/dL — ABNORMAL LOW (ref 12.0–15.0)
MCH: 26.8 pg (ref 26.0–34.0)
MCHC: 30.2 g/dL (ref 30.0–36.0)
MCV: 88.8 fL (ref 80.0–100.0)
Platelets: 207 10*3/uL (ref 150–400)
RBC: 2.5 MIL/uL — ABNORMAL LOW (ref 3.87–5.11)
RDW: 18.2 % — ABNORMAL HIGH (ref 11.5–15.5)
WBC: 2.5 10*3/uL — ABNORMAL LOW (ref 4.0–10.5)
nRBC: 0 % (ref 0.0–0.2)

## 2021-09-14 LAB — PREPARE RBC (CROSSMATCH)

## 2021-09-14 LAB — PROCALCITONIN: Procalcitonin: 0.53 ng/mL

## 2021-09-14 MED ORDER — SODIUM CHLORIDE 0.9 % IV SOLN: INTRAVENOUS | Status: DC | PRN

## 2021-09-14 MED ORDER — PREDNISONE 50 MG PO TABS
50.0000 mg | ORAL_TABLET | Freq: Every day | ORAL | Status: DC
  Administered 2021-09-15 – 2021-09-16 (×2): 50 mg via ORAL
  Filled 2021-09-14 (×2): qty 1

## 2021-09-14 MED ORDER — FUROSEMIDE 10 MG/ML IJ SOLN
40.0000 mg | Freq: Once | INTRAMUSCULAR | Status: AC
  Administered 2021-09-14: 22:00:00 40 mg via INTRAVENOUS
  Filled 2021-09-14: qty 4

## 2021-09-14 MED ORDER — SODIUM CHLORIDE 0.9% IV SOLUTION: Freq: Once | INTRAVENOUS | Status: AC

## 2021-09-14 NOTE — Progress Notes (Signed)
Late entry Discussed with patient's son about anti viral therapy and he does not want his mother to get Remdesivir because it will worsen her kidney function. Will treat patient with systemic steroids for now.

## 2021-09-14 NOTE — Progress Notes (Signed)
PROGRESS NOTE    [name]  QVZ:[id number] DOB: 02/16/1935 DOA: 09/13/2021 PCP: Leonel Ramsay, MD   Brief Narrative: Taken from H&P. Penny White is a 86 y.o. female with medical history significant for chronic diastolic dysfunction CHF with last known LVEF of 55%, stage IV chronic kidney disease, hypertension, hypothyroidism, marginal zone lymphoma, permanent atrial fibrillation who presents to the emergency room via EMS for evaluation of worsening shortness of breath with exertion as well as a productive cough. Patient had COVID-19 viral infection about 2 weeks ago and was placed on quarantine at their assisted living facility.  She was treated with Molnupiravir as an outpatient but did not require oxygen supplementation. Over the last couple of days she has developed worsening shortness of breath mostly with exertion and has productive cough.  Recently prescribed Z-Pak by PCP but she only took 1 tablet. She was hemodynamically stable.  COVID-19 PCR positive with CT value of 22.7 which is consistent with reactivation, chest x-ray concerning for multifocal pneumonia.  Procalcitonin at 0.58. She was hypoxic requiring up to 3 L of oxygen, no baseline oxygen use. Family refused remdesivir, she was started on steroid along with ceftriaxone and Zithromax for CAP coverage. 12/24: Worsening anemia-ordered 1 unit of PRBC.  Subjective: Patient was seen and examined today.  Son at bedside.  Continues to have some cough.  Assessment & Plan:   Principal Problem:   Acute respiratory failure (HCC) Active Problems:   Chronic kidney disease, stage IV (severe) (HCC)   Chronic atrial fibrillation (HCC)   Essential hypertension   Heart failure with preserved left ventricular function (HFpEF) (HCC)   Anemia in chronic kidney disease   Coronary artery disease   Small cell B-cell lymphoma, unspecified site (HCC)   Hypothyroid   Persistent atrial fibrillation (HCC)   CAP (community  acquired pneumonia)   Pneumonia due to COVID-19 virus  Acute hypoxic respiratory failure secondary to COVID-19 pneumonia with superadded bacterial pneumonia.  No baseline oxygen requirement, currently on 3 L, able to wean to 1 L. Procalcitonin elevated. -Check MRSA PCR -Continue with ceftriaxone and Zithromax -Continue with steroid.-Discontinue Solu-Medrol and continue only with prednisone. -Continue with supportive care -Continue with supplemental oxygen-wean as tolerated  Acute on chronic anemia of chronic disease.  Multifactorial with recent viral illness causing bone marrow suppression, CKD stage IV, patient was not on any Aranesp.  No obvious bleeding. Hemoglobin at 6.7 today.  Patient was on Eliquis which is being held. -Give her 1 unit of PRBC -Monitor hemoglobin  Permanent A. fib.  Rate well controlled. -Holding home dose of Eliquis-we will restart if hemoglobin remained stable after blood transfusion.  Hypothyroidism. -Continue Synthroid  CKD stage IV.  Creatinine currently stable -Monitor renal function -Avoid nephrotoxins  Objective: Vitals:   09/14/21 0538 09/14/21 0811 09/14/21 1242 09/14/21 1628  BP: 129/72 (!) 143/63 (!) 143/53 (!) 141/49  Pulse: 69 64 67 65  Resp:  20 18 19   Temp: 98.4 F (36.9 C) 97.8 F (36.6 C) 98.5 F (36.9 C) 98.1 F (36.7 C)  TempSrc:  Oral Oral Oral  SpO2: 94% 99% 94% 92%  Weight:      Height:        Intake/Output Summary (Last 24 hours) at 09/14/2021 1650 Last data filed at 09/14/2021 1626 Gross per 24 hour  Intake 2353.64 ml  Output 200 ml  Net 2153.64 ml   Filed Weights   09/13/21 1345  Weight: 73 kg    Examination:  General exam:  Appears calm and comfortable  Respiratory system: Clear to auscultation. Respiratory effort normal. Cardiovascular system: S1 & S2 heard, RRR.  Gastrointestinal system: Soft, nontender, nondistended, bowel sounds positive. Central nervous system: Alert and oriented. No focal neurological  deficits. Extremities: 1+LE bilateral edema, no cyanosis, pulses intact and symmetrical. Psychiatry: Judgement and insight appear normal.    DVT prophylaxis: SCDs Code Status: DNR Family Communication: Discussed with son at bedside Disposition Plan:  Status is: Inpatient  Remains inpatient appropriate because: Severity of illness   Hospitalist so I can put this back this the so if you can level of care: Telemetry Medical  All the records are reviewed and case discussed with Care Management/Social Worker. Management plans discussed with the patient, nursing and they are in agreement.  Consultants:  None  Procedures:  Antimicrobials:  Ceftriaxone Zithromax  Data Reviewed: I have personally reviewed following labs and imaging studies  CBC: Recent Labs  Lab 09/13/21 1404 09/14/21 0527  WBC 2.9* 2.5*  NEUTROABS 2.2  --   HGB 7.1* 6.7*  HCT 24.3* 22.2*  MCV 92.4 88.8  PLT 247 161   Basic Metabolic Panel: Recent Labs  Lab 09/13/21 1404 09/14/21 0527  NA 138 140  K 4.3 4.2  CL 105 110  CO2 24 22  GLUCOSE 106* 69*  BUN 23 24*  CREATININE 1.48* 1.21*  CALCIUM 8.3* 7.6*   GFR: Estimated Creatinine Clearance: 32.4 mL/min (A) (by C-G formula based on SCr of 1.21 mg/dL (H)). Liver Function Tests: Recent Labs  Lab 09/13/21 1404  AST 22  ALT 12  ALKPHOS 57  BILITOT 1.2  PROT 5.8*  ALBUMIN 2.5*   No results for input(s): LIPASE, AMYLASE in the last 168 hours. No results for input(s): AMMONIA in the last 168 hours. Coagulation Profile: Recent Labs  Lab 09/13/21 1404  INR 3.0*   Cardiac Enzymes: No results for input(s): CKTOTAL, CKMB, CKMBINDEX, TROPONINI in the last 168 hours. BNP (last 3 results) No results for input(s): PROBNP in the last 8760 hours. HbA1C: No results for input(s): HGBA1C in the last 72 hours. CBG: No results for input(s): GLUCAP in the last 168 hours. Lipid Profile: No results for input(s): CHOL, HDL, LDLCALC, TRIG, CHOLHDL,  LDLDIRECT in the last 72 hours. Thyroid Function Tests: No results for input(s): TSH, T4TOTAL, FREET4, T3FREE, THYROIDAB in the last 72 hours. Anemia Panel: No results for input(s): VITAMINB12, FOLATE, FERRITIN, TIBC, IRON, RETICCTPCT in the last 72 hours. Sepsis Labs: Recent Labs  Lab 09/13/21 1404 09/13/21 1654 09/14/21 0527  PROCALCITON  --   --  0.53  LATICACIDVEN 1.4 1.0  --     Recent Results (from the past 240 hour(s))  Blood Culture (routine x 2)     Status: None (Preliminary result)   Collection Time: 09/13/21  2:04 PM   Specimen: BLOOD  Result Value Ref Range Status   Specimen Description BLOOD BLOOD RIGHT WRIST  Final   Special Requests   Final    BOTTLES DRAWN AEROBIC AND ANAEROBIC Blood Culture adequate volume   Culture   Final    NO GROWTH < 24 HOURS Performed at [facility], [address] [place], [place] [address]    Report Status PENDING  Incomplete  Blood Culture (routine x 2)     Status: None (Preliminary result)   Collection Time: 09/13/21  2:09 PM   Specimen: BLOOD  Result Value Ref Range Status   Specimen Description BLOOD RIGHT ANTECUBITAL  Final   Special Requests  Final    BOTTLES DRAWN AEROBIC AND ANAEROBIC Blood Culture adequate volume   Culture   Final    NO GROWTH < 24 HOURS Performed at [facility], [address] [place], [place] [address]    Report Status PENDING  Incomplete  Resp Panel by RT-PCR (Flu A&B, Covid) Nasopharyngeal Swab     Status: Abnormal   Collection Time: 09/13/21  3:02 PM   Specimen: Nasopharyngeal Swab; Nasopharyngeal(NP) swabs in vial transport medium  Result Value Ref Range Status   SARS Coronavirus 2 by RT PCR POSITIVE (A) NEGATIVE Final    Comment: (NOTE) SARS-CoV-2 target nucleic acids are DETECTED.  The SARS-CoV-2 RNA is generally detectable in upper respiratory specimens during the acute phase of infection. Positive results are indicative of the presence of the identified virus, but  do not rule out bacterial infection or co-infection with other pathogens not detected by the test. Clinical correlation with patient history and other diagnostic information is necessary to determine patient infection status. The expected result is Negative.  Fact Sheet for Patients: EntrepreneurPulse.com.au  Fact Sheet for Healthcare Providers: IncredibleEmployment.be  This test is not yet approved or cleared by the [place] FDA and  has been authorized for detection and/or diagnosis of SARS-CoV-2 by FDA under an Emergency Use Authorization (EUA).  This EUA will remain in effect (meaning this test can be used) for the duration of  the COVID-19 declaration under Section 564(b)(1) of the A ct, 21 U.S.C. section 360bbb-3(b)(1), unless the authorization is terminated or revoked sooner.     Influenza A by PCR NEGATIVE NEGATIVE Final   Influenza B by PCR NEGATIVE NEGATIVE Final    Comment: (NOTE) The Xpert Xpress SARS-CoV-2/FLU/RSV plus assay is intended as an aid in the diagnosis of influenza from Nasopharyngeal swab specimens and should not be used as a sole basis for treatment. Nasal washings and aspirates are unacceptable for Xpert Xpress SARS-CoV-2/FLU/RSV testing.  Fact Sheet for Patients: EntrepreneurPulse.com.au  Fact Sheet for Healthcare Providers: IncredibleEmployment.be  This test is not yet approved or cleared by the [place] FDA and has been authorized for detection and/or diagnosis of SARS-CoV-2 by FDA under an Emergency Use Authorization (EUA). This EUA will remain in effect (meaning this test can be used) for the duration of the COVID-19 declaration under Section 564(b)(1) of the Act, 21 U.S.C. section 360bbb-3(b)(1), unless the authorization is terminated or revoked.  Performed at [facility], [address], [place], [place] [address]      Radiology Studies: DG  Chest [place] 1 View  Result Date: 09/13/2021 CLINICAL DATA:  Hypoxia, COVID EXAM: PORTABLE CHEST 1 VIEW COMPARISON:  07/31/2021 FINDINGS: Multifocal patchy opacities in the lungs bilaterally, most prominent in the left upper lobe. Small layering right pleural effusion. No pneumothorax. Mild cardiomegaly.  Thoracic aortic atherosclerosis. IMPRESSION: Multifocal pneumonia in this patient with known COVID. Small right pleural effusion. Electronically Signed   By: Julian Hy M.D.   On: 09/13/2021 14:36    Scheduled Meds:  sodium chloride   Intravenous Once   albuterol  2 puff Inhalation Q6H   vitamin C  500 mg Oral Daily   ferrous sulfate  325 mg Oral QODAY   furosemide  40 mg Intravenous Once   levothyroxine  50 mcg Oral QAC breakfast   methylPREDNISolone (SOLU-MEDROL) injection  0.5 mg/kg Intravenous Q12H   Followed by   Derrill Memo ON 09/16/2021] predniSONE  50 mg Oral Daily   sodium chloride flush  3 mL Intravenous Q12H  zinc sulfate  220 mg Oral Daily   Continuous Infusions:  sodium chloride     sodium chloride 10 mL/hr at 09/14/21 1543   azithromycin Stopped (09/13/21 1927)   cefTRIAXone (ROCEPHIN)  IV 2 g (09/14/21 1546)     LOS: 1 day   Time spent: 43 minutes. More than 50% of the time was spent in counseling/coordination of care  Lorella Nimrod, MD [facility]  If 7PM-7AM, please contact night-coverage [email or web address]  09/14/2021, 4:50 PM   This record has been created using Systems analyst. Errors have been sought and corrected,but may not always be located. Such creation errors do not reflect on the standard of care.

## 2021-09-15 DIAGNOSIS — J9600 Acute respiratory failure, unspecified whether with hypoxia or hypercapnia: Secondary | ICD-10-CM | POA: Diagnosis not present

## 2021-09-15 DIAGNOSIS — D631 Anemia in chronic kidney disease: Secondary | ICD-10-CM | POA: Diagnosis not present

## 2021-09-15 DIAGNOSIS — N189 Chronic kidney disease, unspecified: Secondary | ICD-10-CM | POA: Diagnosis not present

## 2021-09-15 DIAGNOSIS — J189 Pneumonia, unspecified organism: Secondary | ICD-10-CM | POA: Diagnosis not present

## 2021-09-15 LAB — BPAM RBC
Blood Product Expiration Date: 202212272359
ISSUE DATE / TIME: 202212241727
Unit Type and Rh: 9500

## 2021-09-15 LAB — CBC
HCT: 26.7 % — ABNORMAL LOW (ref 36.0–46.0)
Hemoglobin: 8.4 g/dL — ABNORMAL LOW (ref 12.0–15.0)
MCH: 27.3 pg (ref 26.0–34.0)
MCHC: 31.5 g/dL (ref 30.0–36.0)
MCV: 86.7 fL (ref 80.0–100.0)
Platelets: 226 10*3/uL (ref 150–400)
RBC: 3.08 MIL/uL — ABNORMAL LOW (ref 3.87–5.11)
RDW: 17.9 % — ABNORMAL HIGH (ref 11.5–15.5)
WBC: 2.2 10*3/uL — ABNORMAL LOW (ref 4.0–10.5)
nRBC: 0 % (ref 0.0–0.2)

## 2021-09-15 LAB — BASIC METABOLIC PANEL
Anion gap: 10 (ref 5–15)
BUN: 25 mg/dL — ABNORMAL HIGH (ref 8–23)
CO2: 26 mmol/L (ref 22–32)
Calcium: 8.1 mg/dL — ABNORMAL LOW (ref 8.9–10.3)
Chloride: 103 mmol/L (ref 98–111)
Creatinine, Ser: 1 mg/dL (ref 0.44–1.00)
GFR, Estimated: 56 mL/min — ABNORMAL LOW (ref >60)
Glucose, Bld: 188 mg/dL — ABNORMAL HIGH (ref 70–99)
Potassium: 4.4 mmol/L (ref 3.5–5.1)
Sodium: 139 mmol/L (ref 135–145)

## 2021-09-15 LAB — TYPE AND SCREEN
ABO/RH(D): O POS
Antibody Screen: NEGATIVE
Unit division: 0

## 2021-09-15 MED ORDER — AZITHROMYCIN 500 MG PO TABS
500.0000 mg | ORAL_TABLET | Freq: Every day | ORAL | Status: DC
  Administered 2021-09-15 – 2021-09-16 (×2): 500 mg via ORAL
  Filled 2021-09-15 (×2): qty 1

## 2021-09-15 NOTE — Progress Notes (Signed)
PROGRESS NOTE    Penny White  ZOX:[id number] DOB: 02/21/1935 DOA: 09/13/2021 PCP: Leonel Ramsay, MD   Brief Narrative: Taken from H&P. Penny White is a 86 y.o. female with medical history significant for chronic diastolic dysfunction CHF with last known LVEF of 55%, stage IV chronic kidney disease, hypertension, hypothyroidism, marginal zone lymphoma, permanent atrial fibrillation who presents to the emergency room via EMS for evaluation of worsening shortness of breath with exertion as well as a productive cough. Patient had COVID-19 viral infection about 2 weeks ago and was placed on quarantine at their assisted living facility.  She was treated with Molnupiravir as an outpatient but did not require oxygen supplementation. Over the last couple of days she has developed worsening shortness of breath mostly with exertion and has productive cough.  Recently prescribed Z-Pak by PCP but she only took 1 tablet. She was hemodynamically stable.  COVID-19 PCR positive with CT value of 22.7 which is consistent with reactivation, chest x-ray concerning for multifocal pneumonia.  Procalcitonin at 0.58. She was hypoxic requiring up to 3 L of oxygen, no baseline oxygen use. Family refused remdesivir, she was started on steroid along with ceftriaxone and Zithromax for CAP coverage. 12/24: Worsening anemia-ordered 1 unit of PRBC.  12/25: Mostly stable, still requiring intermittent oxygen, she wants to go back to her facility, TOC unable to reach anyone for discharge today.  Subjective: Patient was seen and examined today.  She thinks that she is improving, continue to have mild cough.  She wants to go back to her facility to celebrate Christmas with friends.  Assessment & Plan:   Principal Problem:   Acute respiratory failure (HCC) Active Problems:   Chronic kidney disease, stage IV (severe) (HCC)   Chronic atrial fibrillation (HCC)   Essential hypertension   Heart failure with  preserved left ventricular function (HFpEF) (HCC)   Anemia in chronic kidney disease   Coronary artery disease   Small cell B-cell lymphoma, unspecified site (HCC)   Hypothyroid   Persistent atrial fibrillation (HCC)   CAP (community acquired pneumonia)   Pneumonia due to COVID-19 virus  Acute hypoxic respiratory failure secondary to COVID-19 pneumonia with superadded bacterial pneumonia.  No baseline oxygen requirement, currently on 1 L. Procalcitonin elevated.  MRSA PCR negative. -Continue with ceftriaxone and Zithromax -Continue with prednisone. -Continue with supportive care -Continue with supplemental oxygen-wean as tolerated  Acute on chronic anemia of chronic disease.  Multifactorial with recent viral illness causing bone marrow suppression, CKD stage IV, patient was not on any Aranesp.  No obvious bleeding. Hemoglobin at 6.7 today.  Patient was on Eliquis which is being held.  Received 1 unit of PRBC with improvement of hemoglobin to 8.4 today. -Resume Eliquis -Monitor hemoglobin  Permanent A. fib.  Rate well controlled. -We will resume Eliquis, no obvious bleeding  Hypothyroidism. -Continue Synthroid  CKD stage IV.  Creatinine currently stable -Monitor renal function -Avoid nephrotoxins  Objective: Vitals:   09/14/21 2112 09/15/21 0632 09/15/21 0822 09/15/21 1306  BP: (!) 143/72 (!) 154/68 (!) 171/71 (!) 150/65  Pulse: (!) 59 68 83 64  Resp: 18 20 16 18   Temp: 98.6 F (37 C) 97.8 F (36.6 C) 98.1 F (36.7 C) 97.6 F (36.4 C)  TempSrc: Oral Oral Oral Oral  SpO2: 91% 94% 97% 98%  Weight:      Height:        Intake/Output Summary (Last 24 hours) at 09/15/2021 1415 Last data filed at 09/15/2021 0331 Gross per  24 hour  Intake 779.6 ml  Output 1500 ml  Net -720.4 ml    Filed Weights   09/13/21 1345  Weight: 73 kg    Examination:  General.  Pleasant elderly lady, in no acute distress. Pulmonary.  Lungs clear bilaterally, normal respiratory  effort. CV.  Regular rate and rhythm, no JVD, rub or murmur. Abdomen.  Soft, nontender, nondistended, BS positive. CNS.  Alert and oriented .  No focal neurologic deficit. Extremities.  1+ LE edema, no cyanosis, pulses intact and symmetrical. Psychiatry.  Judgment and insight appears normal.   DVT prophylaxis: SCDs Code Status: DNR Family Communication: Talked with son on phone. Disposition Plan:  Status is: Inpatient  Remains inpatient appropriate because: Severity of illness   Hospitalist so I can put this back this the so if you can level of care: Telemetry Medical  All the records are reviewed and case discussed with Care Management/Social Worker. Management plans discussed with the patient, nursing and they are in agreement.  Consultants:  None  Procedures:  Antimicrobials:  Ceftriaxone Zithromax  Data Reviewed: I have personally reviewed following labs and imaging studies  CBC: Recent Labs  Lab 09/13/21 1404 09/14/21 0527 09/14/21 2302 09/15/21 0439  WBC 2.9* 2.5*  --  2.2*  NEUTROABS 2.2  --   --   --   HGB 7.1* 6.7* 9.2* 8.4*  HCT 24.3* 22.2* 30.1* 26.7*  MCV 92.4 88.8  --  86.7  PLT 247 207  --  098    Basic Metabolic Panel: Recent Labs  Lab 09/13/21 1404 09/14/21 0527 09/15/21 0439  NA 138 140 139  K 4.3 4.2 4.4  CL 105 110 103  CO2 24 22 26   GLUCOSE 106* 69* 188*  BUN 23 24* 25*  CREATININE 1.48* 1.21* 1.00  CALCIUM 8.3* 7.6* 8.1*    GFR: Estimated Creatinine Clearance: 39.2 mL/min (by C-G formula based on SCr of 1 mg/dL). Liver Function Tests: Recent Labs  Lab 09/13/21 1404  AST 22  ALT 12  ALKPHOS 57  BILITOT 1.2  PROT 5.8*  ALBUMIN 2.5*    No results for input(s): LIPASE, AMYLASE in the last 168 hours. No results for input(s): AMMONIA in the last 168 hours. Coagulation Profile: Recent Labs  Lab 09/13/21 1404  INR 3.0*    Cardiac Enzymes: No results for input(s): CKTOTAL, CKMB, CKMBINDEX, TROPONINI in the last 168  hours. BNP (last 3 results) No results for input(s): PROBNP in the last 8760 hours. HbA1C: No results for input(s): HGBA1C in the last 72 hours. CBG: No results for input(s): GLUCAP in the last 168 hours. Lipid Profile: No results for input(s): CHOL, HDL, LDLCALC, TRIG, CHOLHDL, LDLDIRECT in the last 72 hours. Thyroid Function Tests: No results for input(s): TSH, T4TOTAL, FREET4, T3FREE, THYROIDAB in the last 72 hours. Anemia Panel: No results for input(s): VITAMINB12, FOLATE, FERRITIN, TIBC, IRON, RETICCTPCT in the last 72 hours. Sepsis Labs: Recent Labs  Lab 09/13/21 1404 09/13/21 1654 09/14/21 0527  PROCALCITON  --   --  0.53  LATICACIDVEN 1.4 1.0  --      Recent Results (from the past 240 hour(s))  Blood Culture (routine x 2)     Status: None (Preliminary result)   Collection Time: 09/13/21  2:04 PM   Specimen: BLOOD  Result Value Ref Range Status   Specimen Description BLOOD BLOOD RIGHT WRIST  Final   Special Requests   Final    BOTTLES DRAWN AEROBIC AND ANAEROBIC Blood Culture adequate volume  Culture   Final    NO GROWTH 2 DAYS Performed at [facility], [address] [place], [place] [address]    Report Status PENDING  Incomplete  Blood Culture (routine x 2)     Status: None (Preliminary result)   Collection Time: 09/13/21  2:09 PM   Specimen: BLOOD  Result Value Ref Range Status   Specimen Description BLOOD RIGHT ANTECUBITAL  Final   Special Requests   Final    BOTTLES DRAWN AEROBIC AND ANAEROBIC Blood Culture adequate volume   Culture   Final    NO GROWTH 2 DAYS Performed at [facility], [address], [place], [place] [address]    Report Status PENDING  Incomplete  Resp Panel by RT-PCR (Flu A&B, Covid) Nasopharyngeal Swab     Status: Abnormal   Collection Time: 09/13/21  3:02 PM   Specimen: Nasopharyngeal Swab; Nasopharyngeal(NP) swabs in vial transport medium  Result Value Ref Range Status   SARS Coronavirus 2 by RT PCR  POSITIVE (A) NEGATIVE Final    Comment: (NOTE) SARS-CoV-2 target nucleic acids are DETECTED.  The SARS-CoV-2 RNA is generally detectable in upper respiratory specimens during the acute phase of infection. Positive results are indicative of the presence of the identified virus, but do not rule out bacterial infection or co-infection with other pathogens not detected by the test. Clinical correlation with patient history and other diagnostic information is necessary to determine patient infection status. The expected result is Negative.  Fact Sheet for Patients: EntrepreneurPulse.com.au  Fact Sheet for Healthcare Providers: IncredibleEmployment.be  This test is not yet approved or cleared by the [place] FDA and  has been authorized for detection and/or diagnosis of SARS-CoV-2 by FDA under an Emergency Use Authorization (EUA).  This EUA will remain in effect (meaning this test can be used) for the duration of  the COVID-19 declaration under Section 564(b)(1) of the A ct, 21 U.S.C. section 360bbb-3(b)(1), unless the authorization is terminated or revoked sooner.     Influenza A by PCR NEGATIVE NEGATIVE Final   Influenza B by PCR NEGATIVE NEGATIVE Final    Comment: (NOTE) The Xpert Xpress SARS-CoV-2/FLU/RSV plus assay is intended as an aid in the diagnosis of influenza from Nasopharyngeal swab specimens and should not be used as a sole basis for treatment. Nasal washings and aspirates are unacceptable for Xpert Xpress SARS-CoV-2/FLU/RSV testing.  Fact Sheet for Patients: EntrepreneurPulse.com.au  Fact Sheet for Healthcare Providers: IncredibleEmployment.be  This test is not yet approved or cleared by the [place] FDA and has been authorized for detection and/or diagnosis of SARS-CoV-2 by FDA under an Emergency Use Authorization (EUA). This EUA will remain in effect (meaning this test can be used)  for the duration of the COVID-19 declaration under Section 564(b)(1) of the Act, 21 U.S.C. section 360bbb-3(b)(1), unless the authorization is terminated or revoked.  Performed at [facility], [address] [place], [place] [address]   MRSA Next Gen by PCR, Nasal     Status: None   Collection Time: 09/14/21  4:54 PM   Specimen: Nasal Mucosa; Nasal Swab  Result Value Ref Range Status   MRSA by PCR Next Gen NOT DETECTED NOT DETECTED Final    Comment: (NOTE) The GeneXpert MRSA Assay (FDA approved for NASAL specimens only), is one component of a comprehensive MRSA colonization surveillance program. It is not intended to diagnose MRSA infection nor to guide or monitor treatment for MRSA infections. Test performance is not FDA approved in patients  less than 2 years old. Performed at [facility], [address], [place], [place] [address]       Radiology Studies: DG Chest [place] 1 View  Result Date: 09/13/2021 CLINICAL DATA:  Hypoxia, COVID EXAM: PORTABLE CHEST 1 VIEW COMPARISON:  07/31/2021 FINDINGS: Multifocal patchy opacities in the lungs bilaterally, most prominent in the left upper lobe. Small layering right pleural effusion. No pneumothorax. Mild cardiomegaly.  Thoracic aortic atherosclerosis. IMPRESSION: Multifocal pneumonia in this patient with known COVID. Small right pleural effusion. Electronically Signed   By: Julian Hy M.D.   On: 09/13/2021 14:36    Scheduled Meds:  albuterol  2 puff Inhalation Q6H   vitamin C  500 mg Oral Daily   azithromycin  500 mg Oral Daily   ferrous sulfate  325 mg Oral QODAY   levothyroxine  50 mcg Oral QAC breakfast   predniSONE  50 mg Oral Q breakfast   sodium chloride flush  3 mL Intravenous Q12H   zinc sulfate  220 mg Oral Daily   Continuous Infusions:  sodium chloride     sodium chloride Stopped (09/15/21 0534)   cefTRIAXone (ROCEPHIN)  IV Stopped (09/14/21 1616)     LOS: 2 days   Time spent: 40  minutes. More than 50% of the time was spent in counseling/coordination of care  Lorella Nimrod, MD [facility]  If 7PM-7AM, please contact night-coverage [email or web address]  09/15/2021, 2:15 PM   This record has been created using Systems analyst. Errors have been sought and corrected,but may not always be located. Such creation errors do not reflect on the standard of care.

## 2021-09-15 NOTE — Progress Notes (Signed)
PHARMACIST - PHYSICIAN COMMUNICATION  CONCERNING: Antibiotic IV to Oral Route Change Policy  RECOMMENDATION: This patient is receiving azithromycin by the intravenous route.  Based on criteria approved by the Pharmacy and Therapeutics Committee, the antibiotic(s) is/are being converted to the equivalent oral dose form(s).   DESCRIPTION: These criteria include: Patient being treated for a respiratory tract infection, urinary tract infection, cellulitis or clostridium difficile associated diarrhea if on metronidazole The patient is not neutropenic and does not exhibit a GI malabsorption state The patient is eating (either orally or via tube) and/or has been taking other orally administered medications for a least 24 hours The patient is improving clinically and has a Tmax < 100.5  If you have questions about this conversion, please contact the [facility]  09/15/21

## 2021-09-16 DIAGNOSIS — J9600 Acute respiratory failure, unspecified whether with hypoxia or hypercapnia: Secondary | ICD-10-CM | POA: Diagnosis not present

## 2021-09-16 DIAGNOSIS — E038 Other specified hypothyroidism: Secondary | ICD-10-CM

## 2021-09-16 DIAGNOSIS — I10 Essential (primary) hypertension: Secondary | ICD-10-CM | POA: Diagnosis not present

## 2021-09-16 DIAGNOSIS — J189 Pneumonia, unspecified organism: Secondary | ICD-10-CM | POA: Diagnosis not present

## 2021-09-16 DIAGNOSIS — N184 Chronic kidney disease, stage 4 (severe): Secondary | ICD-10-CM | POA: Diagnosis not present

## 2021-09-16 LAB — URINE CULTURE: Culture: <10000 — AB

## 2021-09-16 LAB — CBC
HCT: 28.8 % — ABNORMAL LOW (ref 36.0–46.0)
Hemoglobin: 8.8 g/dL — ABNORMAL LOW (ref 12.0–15.0)
MCH: 26.5 pg (ref 26.0–34.0)
MCHC: 30.6 g/dL (ref 30.0–36.0)
MCV: 86.7 fL (ref 80.0–100.0)
Platelets: 231 10*3/uL (ref 150–400)
RBC: 3.32 MIL/uL — ABNORMAL LOW (ref 3.87–5.11)
RDW: 18 % — ABNORMAL HIGH (ref 11.5–15.5)
WBC: 3 10*3/uL — ABNORMAL LOW (ref 4.0–10.5)
nRBC: 0 % (ref 0.0–0.2)

## 2021-09-16 MED ORDER — AMOXICILLIN-POT CLAVULANATE 875-125 MG PO TABS: 1.0000 | ORAL_TABLET | Freq: Two times a day (BID) | ORAL | 0 refills | Status: AC

## 2021-09-16 MED ORDER — ASCORBIC ACID 500 MG PO TABS: 500.0000 mg | ORAL_TABLET | Freq: Every day | ORAL | 1 refills | Status: AC

## 2021-09-16 MED ORDER — AZITHROMYCIN 500 MG PO TABS: 500.0000 mg | ORAL_TABLET | Freq: Every day | ORAL | 0 refills | Status: AC

## 2021-09-16 MED ORDER — ALBUTEROL SULFATE HFA 108 (90 BASE) MCG/ACT IN AERS: 2.0000 | INHALATION_SPRAY | Freq: Four times a day (QID) | RESPIRATORY_TRACT | 0 refills | Status: AC

## 2021-09-16 MED ORDER — APIXABAN 5 MG PO TABS
5.0000 mg | ORAL_TABLET | Freq: Two times a day (BID) | ORAL | Status: DC
  Administered 2021-09-16: 10:00:00 5 mg via ORAL
  Filled 2021-09-16: qty 1

## 2021-09-16 MED ORDER — GUAIFENESIN-DM 100-10 MG/5ML PO SYRP: 10.0000 mL | ORAL_SOLUTION | ORAL | 0 refills | Status: AC | PRN

## 2021-09-16 MED ORDER — ZINC SULFATE 220 (50 ZN) MG PO CAPS: 220.0000 mg | ORAL_CAPSULE | Freq: Every day | ORAL | 1 refills | Status: AC

## 2021-09-16 MED ORDER — PREDNISONE 50 MG PO TABS: ORAL_TABLET | ORAL | 0 refills | Status: AC

## 2021-09-16 MED ORDER — PREDNISONE 5 MG PO TABS: 5.0000 mg | ORAL_TABLET | Freq: Every day | ORAL | Status: AC

## 2021-09-16 NOTE — Evaluation (Signed)
Physical Therapy Evaluation Patient Details Name: Penny White MRN: [id number] DOB: August 09, 1935 Today's Date: 09/16/2021  History of Present Illness  Penny White is an 25yoF who comes to [facility] on 12/23 from [facility]. Pt here 2/2 SOB. PMH: CHF, CKD-4, HTN, hypoTSH, AF. Pt (+) COVID19 2 weeks prior at facility.Pt has been steadily recovering strength after being hospitalized here ~6 weeks ago.  Clinical Impression  Pt admitted with above diagnosis. Pt currently with functional limitations due to the deficits listed below (see "PT Problem List"). Patient agreeable to PT evaluation. Patient provides detailed description of PLOF and home environment. Pt reports no subjective awareness of acute weakness or imbalance, and says her proverbial "shortness of breath wasn't that bad." Pt drops to 83% post AMB 6ft over 8 minutes of walking, has no clear dyspnea. Pt requires supplemental O2 for recovery. At [place], son here for transport as well as DME rep with O2 for home. Son educated on author's concerns for line safety at DC due to patient's visual impairment. Patient's assessment this date reveals the patient requires an additional person present for safety and/or physical assistance to complete their typical ADL. At baseline, the patient is able to perform ADL with modified independence. Patient will benefit from skilled PT intervention to maximize independence and safety in mobility required for basic ADL performance at discharge.          Recommendations for follow up therapy are one component of a multi-disciplinary discharge planning process, led by the attending physician.  Recommendations may be updated based on patient status, additional functional criteria and insurance authorization.  Follow Up Recommendations Home health PT (HHPT at ALF; will now need assistance with AMB for O2 tubing management due to visual impairment.)    Assistance Recommended at Discharge Intermittent  Supervision/Assistance  Functional Status Assessment Patient has had a recent decline in their functional status and demonstrates the ability to make significant improvements in function in a reasonable and predictable amount of time.  Equipment Recommendations   (O2)    Recommendations for Other Services       Precautions / Restrictions Precautions Precautions: Fall Precaution Comments: visaully impaired Restrictions Weight Bearing Restrictions: No      Mobility  Bed Mobility Overal bed mobility: Needs Assistance Bed Mobility: Supine to Sit     Supine to sit: Supervision     General bed mobility comments: in chair    Transfers Overall transfer level: Needs assistance Equipment used: Rolling walker (2 wheels) Transfers: Sit to/from Stand Sit to Stand: Supervision           General transfer comment: slow, steady    Ambulation/Gait Ambulation/Gait assistance: Min guard Gait Distance (Feet): 40 Feet Assistive device: Rolling walker (2 wheels)   Gait velocity: <0.24m/s     General Gait Details: navigation cues due to visual impairment  Stairs            Wheelchair Mobility    Modified Rankin (Stroke Patients Only)       Balance Overall balance assessment: Needs assistance Sitting-balance support: Feet supported Sitting balance-Leahy Scale: Good     Standing balance support: Reliant on assistive device for balance;During functional activity Standing balance-Leahy Scale: Fair                               Pertinent Vitals/Pain Pain Assessment: No/denies pain    Home Living Family/patient expects to be discharged to:: Assisted living  Home Equipment: Rollator (4 wheels)      Prior Function Prior Level of Function : Needs assist             Mobility Comments: Mod indep with 4WRW inside indep apartment space; ambulates to/from dining hall for meals ADLs Comments: Pt reports staff assist her in and  out of shower but that she dresses herself; visually impaired     Hand Dominance        Extremity/Trunk Assessment   Upper Extremity Assessment Upper Extremity Assessment: Overall WFL for tasks assessed;Generalized weakness    Lower Extremity Assessment Lower Extremity Assessment: Overall WFL for tasks assessed;Generalized weakness       Communication   Communication: No difficulties  Cognition Arousal/Alertness: Awake/alert Behavior During Therapy: WFL for tasks assessed/performed Overall Cognitive Status: Within Functional Limits for tasks assessed                                          General Comments      Exercises     Assessment/Plan    PT Assessment Patient needs continued PT services  PT Problem List Cardiopulmonary status limiting activity;Decreased activity tolerance;Decreased strength;Decreased range of motion       PT Treatment Interventions DME instruction;Balance training;Gait training;Stair training;Functional mobility training;Therapeutic activities;Therapeutic exercise;Patient/family education    PT Goals (Current goals can be found in the Care Plan section)  Acute Rehab PT Goals Patient Stated Goal: go home PT Goal Formulation: With patient Time For Goal Achievement: 09/30/21 Potential to Achieve Goals: Good    Frequency Min 2X/week   Barriers to discharge        Co-evaluation               AM-PAC PT "6 Clicks" Mobility  Outcome Measure Help needed turning from your back to your side while in a flat bed without using bedrails?: A Little Help needed moving from lying on your back to sitting on the side of a flat bed without using bedrails?: A Little Help needed moving to and from a bed to a chair (including a wheelchair)?: A Little Help needed standing up from a chair using your arms (e.g., wheelchair or bedside chair)?: A Little Help needed to walk in hospital room?: A Little Help needed climbing 3-5 steps with  a railing? : A Lot 6 Click Score: 17    End of Session Equipment Utilized During Treatment: Oxygen Activity Tolerance: Patient tolerated treatment well;No increased pain Patient left: in chair;with family/visitor present;with call bell/phone within reach Nurse Communication: Mobility status PT Visit Diagnosis: Difficulty in walking, not elsewhere classified (R26.2);Other abnormalities of gait and mobility (R26.89)    Time: 1510-1551 PT Time Calculation (min) (ACUTE ONLY): 41 min   Charges:   PT Evaluation $PT Eval Moderate Complexity: 1 Mod PT Treatments $Therapeutic Exercise: 23-37 mins       4:03 PM, 09/16/21 Etta Grandchild, PT, DPT Physical Therapist - [facility]  [phone] ([place])    Penny White 09/16/2021, 3:59 PM

## 2021-09-16 NOTE — Discharge Summary (Signed)
Physician Discharge Summary  Penny White ERD:[id number] DOB: [birth year] DOA: 09/13/2021  PCP: Leonel Ramsay, MD  Admit date: 09/13/2021 Discharge date: 09/16/2021  Admitted From: ALF Disposition: ALF  Recommendations for Outpatient Follow-up:  Follow up with PCP in 1-2 weeks Please obtain BMP/CBC in one week Please follow up on the following pending results: None  Home Health: Yes Equipment/Devices: Home oxygen, rolling walker Discharge Condition: Stable CODE STATUS: DNR Diet recommendation: Heart Healthy   Brief/Interim Summary: Penny White is a 86 y.o. female with medical history significant for chronic diastolic dysfunction CHF with last known LVEF of 55%, stage IV chronic kidney disease, hypertension, hypothyroidism, marginal zone lymphoma, permanent atrial fibrillation who presents to the emergency room via EMS for evaluation of worsening shortness of breath with exertion as well as a productive cough. Patient had COVID-19 viral infection about 2 weeks ago and was placed on quarantine at their assisted living facility.  She was treated with Molnupiravir as an outpatient but did not require oxygen supplementation. Over the last couple of days she has developed worsening shortness of breath mostly with exertion and has productive cough.  Recently prescribed Z-Pak by PCP but she only took 1 tablet. She was hemodynamically stable.  COVID-19 PCR positive with CT value of 22.7 which is consistent with reactivation, chest x-ray concerning for multifocal pneumonia.  Procalcitonin at 0.58. She was hypoxic requiring up to 3 L of oxygen, no baseline oxygen use. Family refused remdesivir, she was started on steroid along with ceftriaxone and Zithromax for CAP coverage.  Patient clinically improved.  Continue to require up to 2 L of oxygen with ambulation, discharged on home oxygen and home health services.  She can follow-up with her primary care provider who can wean her off from  oxygen.  She was discharged on 1 more day of Zithromax and 3 days of Augmentin.  Patient was also found to have worsening anemia, received 1 unit of PRBC and hemoglobin stable around 8.4.  No obvious bleeding.  Patient is on Eliquis.  Most likely multifactorial with recent viral illness causing bone marrow suppression, CKD stage IV and patient was not on any Aranesp.  She can discuss with her nephrologist to start Aranesp if appropriate.  Renal functions remained stable.  She will continue the rest of her home medications and follow-up with her providers.  Discharge Diagnoses:  Principal Problem:   Acute respiratory failure ([place]) Active Problems:   Chronic kidney disease, stage IV (severe) (HCC)   Chronic atrial fibrillation (HCC)   Essential hypertension   Heart failure with preserved left ventricular function (HFpEF) (HCC)   Anemia in chronic kidney disease   Coronary artery disease   Small cell B-cell lymphoma, unspecified site (HCC)   Hypothyroid   Persistent atrial fibrillation (HCC)   CAP (community acquired pneumonia)   Pneumonia due to COVID-19 virus   Discharge Instructions  Discharge Instructions     Diet - low sodium heart healthy   Complete by: As directed    Discharge instructions   Complete by: As directed    It was pleasure taking care of you. You are being given higher doses of prednisone for 5 days, you can resume your home dose after finishing that. You are also being given antibiotics, Zithromax for 1 more day and Augmentin for 3 days. You can use oxygen if feels short of breath or your saturation drops below 90%. Keep yourself well-hydrated and follow-up with your primary care doctor for further recommendations  Increase activity slowly   Complete by: As directed    No dressing needed   Complete by: As directed       Allergies as of 09/16/2021       Reactions   Ciprofloxacin Itching   Novocain [procaine]    Doesn't remember reaction         Medication List     TAKE these medications    acetaminophen 500 MG tablet Commonly known as: TYLENOL Take 1 tablet (500 mg total) by mouth every 4 (four) hours as needed for moderate pain. What changed: when to take this   albuterol 108 (90 Base) MCG/ACT inhaler Commonly known as: VENTOLIN HFA Inhale 2 puffs into the lungs every 6 (six) hours.   amoxicillin-clavulanate 875-125 MG tablet Commonly known as: AUGMENTIN Take 1 tablet by mouth 2 (two) times daily for 3 days.   apixaban 2.5 MG Tabs tablet Commonly known as: ELIQUIS Take 2 tablets (5 mg total) by mouth 2 (two) times daily.   ascorbic acid 500 MG tablet Commonly known as: VITAMIN C Take 1 tablet (500 mg total) by mouth daily. Start taking on: September 17, 2021   azithromycin 500 MG tablet Commonly known as: ZITHROMAX Take 1 tablet (500 mg total) by mouth daily for 1 day. Start taking on: September 17, 2021   carboxymethylcellulose 0.5 % Soln Commonly known as: REFRESH PLUS Place 1 drop into both eyes 3 (three) times daily as needed (dry eyes).   ferrous sulfate 325 (65 FE) MG tablet Take 325 mg by mouth every other day.   guaiFENesin-dextromethorphan 100-10 MG/5ML syrup Commonly known as: ROBITUSSIN DM Take 10 mLs by mouth every 4 (four) hours as needed for cough.   levothyroxine 50 MCG tablet Commonly known as: SYNTHROID Take 50 mcg by mouth daily before breakfast.   MULTIVITAMIN/IRON PO Take 1 tablet by mouth daily.   predniSONE 5 MG tablet Commonly known as: DELTASONE Take 1 tablet (5 mg total) by mouth daily with breakfast. Resume home dose after finishing higher doses of prednisone What changed: additional instructions   predniSONE 50 MG tablet Commonly known as: DELTASONE Take 1 tablet daily for 5 days and then resume your home dose Start taking on: September 17, 2021 What changed: You were already taking a medication with the same name, and this prescription was added. Make sure you understand  how and when to take each.   senna 8.6 MG tablet Commonly known as: SENOKOT Take 1 tablet (8.6 mg total) by mouth 2 (two) times daily as needed for constipation.   zinc sulfate 220 (50 Zn) MG capsule Take 1 capsule (220 mg total) by mouth daily. Start taking on: September 17, 2021               Durable Medical Equipment  (From admission, onward)           Start     Ordered   09/16/21 1326  For home use only DME oxygen  Once       Question Answer Comment  Length of Need 6 Months   Mode or (Route) Nasal cannula   Liters per Minute 2   Frequency Continuous (stationary and portable oxygen unit needed)   Oxygen conserving device Yes   Oxygen delivery system Gas      09/16/21 1325              Discharge Care Instructions  (From admission, onward)           Start  Ordered   09/16/21 0000  No dressing needed        09/16/21 1429            Follow-up Information     Leonel Ramsay, MD. Schedule an appointment as soon as possible for a visit in 1 week(s).   Specialty: Infectious Diseases Contact information: [place] [address] [phone]         Minna Merritts, MD .   Specialty: Cardiology Contact information: [place] [address] [phone]                Allergies  Allergen Reactions   Ciprofloxacin Itching   Novocain [Procaine]     Doesn't remember reaction    Consultations: None  Procedures/Studies: DG Chest Port 1 View  Result Date: 09/13/2021 CLINICAL DATA:  Hypoxia, COVID EXAM: PORTABLE CHEST 1 VIEW COMPARISON:  07/31/2021 FINDINGS: Multifocal patchy opacities in the lungs bilaterally, most prominent in the left upper lobe. Small layering right pleural effusion. No pneumothorax. Mild cardiomegaly.  Thoracic aortic atherosclerosis. IMPRESSION: Multifocal pneumonia in this patient with known COVID. Small right pleural effusion. Electronically Signed    By: Julian Hy M.D.   On: 09/13/2021 14:36    Subjective: Patient was seen and examined today.  No new complaints.  She thinks that she is improving and wants to go back home.  Discharge Exam: Vitals:   09/16/21 0840 09/16/21 1208  BP: (!) 155/59 (!) 153/62  Pulse: 68 69  Resp: 16 18  Temp: 97.9 F (36.6 C) 98 F (36.7 C)  SpO2: 98% 95%   Vitals:   09/15/21 2048 09/16/21 0529 09/16/21 0840 09/16/21 1208  BP: (!) 153/60 (!) 157/67 (!) 155/59 (!) 153/62  Pulse: 65 62 68 69  Resp: (!) 21 16 16 18   Temp: 98.3 F (36.8 C) 97.9 F (36.6 C) 97.9 F (36.6 C) 98 F (36.7 C)  TempSrc: Oral   Oral  SpO2: (!) 83% 99% 98% 95%  Weight:      Height:        General: Pt is alert, awake, not in acute distress Cardiovascular: RRR, S1/S2 +, no rubs, no gallops Respiratory: CTA bilaterally, no wheezing, no rhonchi Abdominal: Soft, NT, ND, bowel sounds + Extremities: no edema, no cyanosis   The results of significant diagnostics from this hospitalization (including imaging, microbiology, ancillary and laboratory) are listed below for reference.    Microbiology: Recent Results (from the past 240 hour(s))  Blood Culture (routine x 2)     Status: None (Preliminary result)   Collection Time: 09/13/21  2:04 PM   Specimen: BLOOD  Result Value Ref Range Status   Specimen Description BLOOD BLOOD RIGHT WRIST  Final   Special Requests   Final    BOTTLES DRAWN AEROBIC AND ANAEROBIC Blood Culture adequate volume   Culture   Final    NO GROWTH 3 DAYS Performed at [facility], [address], [place], [place] [address]    Report Status PENDING  Incomplete  Blood Culture (routine x 2)     Status: None (Preliminary result)   Collection Time: 09/13/21  2:09 PM   Specimen: BLOOD  Result Value Ref Range Status   Specimen Description BLOOD RIGHT ANTECUBITAL  Final   Special Requests   Final    BOTTLES DRAWN AEROBIC AND ANAEROBIC Blood Culture adequate volume   Culture   Final     NO GROWTH 3 DAYS Performed at  [facility], [address], [place], [place] [address]    Report Status PENDING  Incomplete  Resp Panel by RT-PCR (Flu A&B, Covid) Nasopharyngeal Swab     Status: Abnormal   Collection Time: 09/13/21  3:02 PM   Specimen: Nasopharyngeal Swab; Nasopharyngeal(NP) swabs in vial transport medium  Result Value Ref Range Status   SARS Coronavirus 2 by RT PCR POSITIVE (A) NEGATIVE Final    Comment: (NOTE) SARS-CoV-2 target nucleic acids are DETECTED.  The SARS-CoV-2 RNA is generally detectable in upper respiratory specimens during the acute phase of infection. Positive results are indicative of the presence of the identified virus, but do not rule out bacterial infection or co-infection with other pathogens not detected by the test. Clinical correlation with patient history and other diagnostic information is necessary to determine patient infection status. The expected result is Negative.  Fact Sheet for Patients: EntrepreneurPulse.com.au  Fact Sheet for Healthcare Providers: IncredibleEmployment.be  This test is not yet approved or cleared by the [place] FDA and  has been authorized for detection and/or diagnosis of SARS-CoV-2 by FDA under an Emergency Use Authorization (EUA).  This EUA will remain in effect (meaning this test can be used) for the duration of  the COVID-19 declaration under Section 564(b)(1) of the A ct, 21 U.S.C. section 360bbb-3(b)(1), unless the authorization is terminated or revoked sooner.     Influenza A by PCR NEGATIVE NEGATIVE Final   Influenza B by PCR NEGATIVE NEGATIVE Final    Comment: (NOTE) The Xpert Xpress SARS-CoV-2/FLU/RSV plus assay is intended as an aid in the diagnosis of influenza from Nasopharyngeal swab specimens and should not be used as a sole basis for treatment. Nasal washings and aspirates are unacceptable for Xpert Xpress  SARS-CoV-2/FLU/RSV testing.  Fact Sheet for Patients: EntrepreneurPulse.com.au  Fact Sheet for Healthcare Providers: IncredibleEmployment.be  This test is not yet approved or cleared by the [place] FDA and has been authorized for detection and/or diagnosis of SARS-CoV-2 by FDA under an Emergency Use Authorization (EUA). This EUA will remain in effect (meaning this test can be used) for the duration of the COVID-19 declaration under Section 564(b)(1) of the Act, 21 U.S.C. section 360bbb-3(b)(1), unless the authorization is terminated or revoked.  Performed at [facility], [address], [place], [place] [address]   Urine Culture     Status: Abnormal   Collection Time: 09/13/21  4:56 PM   Specimen: Urine, Random  Result Value Ref Range Status   Specimen Description   Final    URINE, RANDOM Performed at [facility], [address], [place], [place] [address]    Special Requests   Final    NONE Performed at [facility], [address] [place], [place] [address]    Culture (A)  Final    <10,000 COLONIES/mL INSIGNIFICANT GROWTH Performed at [facility], [place] [address] [place],  [address]    Report Status 09/16/2021 FINAL  Final  MRSA Next Gen by PCR, Nasal     Status: None   Collection Time: 09/14/21  4:54 PM   Specimen: Nasal Mucosa; Nasal Swab  Result Value Ref Range Status   MRSA by PCR Next Gen NOT DETECTED NOT DETECTED Final    Comment: (NOTE) The GeneXpert MRSA Assay (FDA approved for NASAL specimens only), is one component of a comprehensive MRSA colonization surveillance program. It is not intended to diagnose MRSA infection nor to guide or monitor treatment for MRSA infections. Test performance is  not FDA approved in patients less than 58 years old. Performed at [facility], [address] [place], [place] [address]      Labs: BNP (last 3 results) Recent  Labs    07/31/21 1713  BNP 622.6*   Basic Metabolic Panel: Recent Labs  Lab 09/13/21 1404 09/14/21 0527 09/15/21 0439  NA 138 140 139  K 4.3 4.2 4.4  CL 105 110 103  CO2 24 22 26   GLUCOSE 106* 69* 188*  BUN 23 24* 25*  CREATININE 1.48* 1.21* 1.00  CALCIUM 8.3* 7.6* 8.1*   Liver Function Tests: Recent Labs  Lab 09/13/21 1404  AST 22  ALT 12  ALKPHOS 57  BILITOT 1.2  PROT 5.8*  ALBUMIN 2.5*   No results for input(s): LIPASE, AMYLASE in the last 168 hours. No results for input(s): AMMONIA in the last 168 hours. CBC: Recent Labs  Lab 09/13/21 1404 09/14/21 0527 09/14/21 2302 09/15/21 0439 09/16/21 0516  WBC 2.9* 2.5*  --  2.2* 3.0*  NEUTROABS 2.2  --   --   --   --   HGB 7.1* 6.7* 9.2* 8.4* 8.8*  HCT 24.3* 22.2* 30.1* 26.7* 28.8*  MCV 92.4 88.8  --  86.7 86.7  PLT 247 207  --  226 231   Cardiac Enzymes: No results for input(s): CKTOTAL, CKMB, CKMBINDEX, TROPONINI in the last 168 hours. BNP: Invalid input(s): POCBNP CBG: No results for input(s): GLUCAP in the last 168 hours. D-Dimer Recent Labs    09/13/21 1654  DDIMER 1.09*   Hgb A1c No results for input(s): HGBA1C in the last 72 hours. Lipid Profile No results for input(s): CHOL, HDL, LDLCALC, TRIG, CHOLHDL, LDLDIRECT in the last 72 hours. Thyroid function studies No results for input(s): TSH, T4TOTAL, T3FREE, THYROIDAB in the last 72 hours.  Invalid input(s): FREET3 Anemia work up No results for input(s): VITAMINB12, FOLATE, FERRITIN, TIBC, IRON, RETICCTPCT in the last 72 hours. Urinalysis    Component Value Date/Time   COLORURINE YELLOW 09/13/2021 1654   APPEARANCEUR CLOUDY (A) 09/13/2021 1654   APPEARANCEUR Hazy (A) 04/26/2021 1117   LABSPEC 1.020 09/13/2021 1654   LABSPEC 1.019 05/16/2013 1324   PHURINE 5.5 09/13/2021 1654   GLUCOSEU NEGATIVE 09/13/2021 1654   GLUCOSEU Negative 05/16/2013 1324   HGBUR MODERATE (A) 09/13/2021 1654   BILIRUBINUR NEGATIVE 09/13/2021 1654   BILIRUBINUR  Negative 04/26/2021 1117   BILIRUBINUR Negative 05/16/2013 1324   KETONESUR 15 (A) 09/13/2021 1654   PROTEINUR 100 (A) 09/13/2021 1654   NITRITE NEGATIVE 09/13/2021 1654   LEUKOCYTESUR MODERATE (A) 09/13/2021 1654   LEUKOCYTESUR Negative 05/16/2013 1324   Sepsis Labs Invalid input(s): PROCALCITONIN,  WBC,  LACTICIDVEN Microbiology Recent Results (from the past 240 hour(s))  Blood Culture (routine x 2)     Status: None (Preliminary result)   Collection Time: 09/13/21  2:04 PM   Specimen: BLOOD  Result Value Ref Range Status   Specimen Description BLOOD BLOOD RIGHT WRIST  Final   Special Requests   Final    BOTTLES DRAWN AEROBIC AND ANAEROBIC Blood Culture adequate volume   Culture   Final    NO GROWTH 3 DAYS Performed at [facility], [address] [place], [place] [address]    Report Status PENDING  Incomplete  Blood Culture (routine x 2)     Status: None (Preliminary result)   Collection Time: 09/13/21  2:09 PM   Specimen: BLOOD  Result Value Ref Range Status   Specimen Description BLOOD RIGHT  ANTECUBITAL  Final   Special Requests   Final    BOTTLES DRAWN AEROBIC AND ANAEROBIC Blood Culture adequate volume   Culture   Final    NO GROWTH 3 DAYS Performed at [facility], [address], [place], [place] [address]    Report Status PENDING  Incomplete  Resp Panel by RT-PCR (Flu A&B, Covid) Nasopharyngeal Swab     Status: Abnormal   Collection Time: 09/13/21  3:02 PM   Specimen: Nasopharyngeal Swab; Nasopharyngeal(NP) swabs in vial transport medium  Result Value Ref Range Status   SARS Coronavirus 2 by RT PCR POSITIVE (A) NEGATIVE Final    Comment: (NOTE) SARS-CoV-2 target nucleic acids are DETECTED.  The SARS-CoV-2 RNA is generally detectable in upper respiratory specimens during the acute phase of infection. Positive results are indicative of the presence of the identified virus, but do not rule out bacterial infection or co-infection with other  pathogens not detected by the test. Clinical correlation with patient history and other diagnostic information is necessary to determine patient infection status. The expected result is Negative.  Fact Sheet for Patients: EntrepreneurPulse.com.au  Fact Sheet for Healthcare Providers: IncredibleEmployment.be  This test is not yet approved or cleared by the [place] FDA and  has been authorized for detection and/or diagnosis of SARS-CoV-2 by FDA under an Emergency Use Authorization (EUA).  This EUA will remain in effect (meaning this test can be used) for the duration of  the COVID-19 declaration under Section 564(b)(1) of the A ct, 21 U.S.C. section 360bbb-3(b)(1), unless the authorization is terminated or revoked sooner.     Influenza A by PCR NEGATIVE NEGATIVE Final   Influenza B by PCR NEGATIVE NEGATIVE Final    Comment: (NOTE) The Xpert Xpress SARS-CoV-2/FLU/RSV plus assay is intended as an aid in the diagnosis of influenza from Nasopharyngeal swab specimens and should not be used as a sole basis for treatment. Nasal washings and aspirates are unacceptable for Xpert Xpress SARS-CoV-2/FLU/RSV testing.  Fact Sheet for Patients: EntrepreneurPulse.com.au  Fact Sheet for Healthcare Providers: IncredibleEmployment.be  This test is not yet approved or cleared by the [place] FDA and has been authorized for detection and/or diagnosis of SARS-CoV-2 by FDA under an Emergency Use Authorization (EUA). This EUA will remain in effect (meaning this test can be used) for the duration of the COVID-19 declaration under Section 564(b)(1) of the Act, 21 U.S.C. section 360bbb-3(b)(1), unless the authorization is terminated or revoked.  Performed at [facility], [address] [place], [place] [address]   Urine Culture     Status: Abnormal   Collection Time: 09/13/21  4:56 PM   Specimen: Urine,  Random  Result Value Ref Range Status   Specimen Description   Final    URINE, RANDOM Performed at [facility], [address], [place], [place] [address]    Special Requests   Final    NONE Performed at [facility], [address] [place], [place] [address]    Culture (A)  Final    <10,000 COLONIES/mL INSIGNIFICANT GROWTH Performed at [facility], [place] [address], [place],  [address]    Report Status 09/16/2021 FINAL  Final  MRSA Next Gen by PCR, Nasal     Status: None   Collection Time: 09/14/21  4:54 PM   Specimen: Nasal Mucosa; Nasal Swab  Result Value Ref Range Status   MRSA by PCR Next Gen NOT DETECTED NOT DETECTED Final    Comment: (NOTE) The GeneXpert  MRSA Assay (FDA approved for NASAL specimens only), is one component of a comprehensive MRSA colonization surveillance program. It is not intended to diagnose MRSA infection nor to guide or monitor treatment for MRSA infections. Test performance is not FDA approved in patients less than 64 years old. Performed at [facility], [address] [place], [place] [address]     Time coordinating discharge: Over 30 minutes  SIGNED:  Lorella Nimrod, MD  [facility] 09/16/2021, 2:30 PM  If 7PM-7AM, please contact night-coverage [email or web address]  This record has been created using Systems analyst. Errors have been sought and corrected,but may not always be located. Such creation errors do not reflect on the standard of care.

## 2021-09-16 NOTE — Care Management Important Message (Signed)
Important Message  Patient Details  Name: MERRILL DEANDA MRN: [id number] Date of Birth: 02-24-1935   Medicare Important Message Given:  Yes  Medicare IM reviewed with Illa Level, son/POA, at [phone].  Verbal consent granted for signature.  Copy of Medicare IM sent securely to son's attention at [email or web address].    Dannette Barbara 09/16/2021, 10:30 AM

## 2021-09-16 NOTE — TOC Initial Note (Addendum)
Transition of Care [facility]) - Initial/Assessment Note    Patient Details  Name: Penny White MRN: [id number] Date of Birth: 1935/08/19  Transition of Care [facility]) CM/SW Contact:    Pete Pelt, RN Phone Number: 09/16/2021, 1:41 PM  Clinical Narrative:    Patient resides at [facility].  [facility] today as per Deb at facility.  Son would like to be called to transport patient to ALF  Patient being evaluated for Home Oxygen prior to discharge.  TOC contact information given, TOC to follow.          Addendum; Patient qualifies for home oxygen.  Son consents to using [employer] for oxygen, Zack at [employer] notified, can deliver before discharge today.  Son also asks for attachment on rollator or additional mobile device.  Son will transport  TOC contacted Neoma Laming at [employer] with mobility notes and oxygen requirement.  Neoma Laming confirms taking patient back today.  Expected Discharge Plan: Assisted Living Barriers to Discharge: Continued Medical Work up   Patient Goals and CMS Choice     Choice offered to / list presented to : NA  Expected Discharge Plan and Services Expected Discharge Plan: Assisted Living   Discharge Planning Services: CM Consult Post Acute Care Choice: Durable Medical Equipment (oxygen) Living arrangements for the past 2 months: Assisted Living Facility                                      Prior Living Arrangements/Services Living arrangements for the past 2 months: [place] Lives with:: Facility Resident Patient language and need for interpreter reviewed:: Yes Do you feel safe going back to the place where you live?: Yes      Need for Family Participation in Patient Care: Yes (Comment) Care giver support system in place?: Yes (comment)   Criminal Activity/Legal Involvement Pertinent to Current Situation/Hospitalization: No - Comment as needed  Activities of Daily Living Home Assistive  Devices/Equipment: Walker (specify type) ADL Screening (condition at time of admission) Patient's cognitive ability adequate to safely complete daily activities?: Yes Is the patient deaf or have difficulty hearing?: Yes Does the patient have difficulty seeing, even when wearing glasses/contacts?: Yes Does the patient have difficulty concentrating, remembering, or making decisions?: Yes Patient able to express need for assistance with ADLs?: Yes Does the patient have difficulty dressing or bathing?: Yes Independently performs ADLs?: No Communication: Independent Dressing (OT): Needs assistance Is this a change from baseline?: Pre-admission baseline Grooming: Needs assistance Is this a change from baseline?: Pre-admission baseline Feeding: Independent Bathing: Needs assistance Is this a change from baseline?: Pre-admission baseline In/Out Bed: Needs assistance Is this a change from baseline?: Pre-admission baseline Walks in Home: Needs assistance Is this a change from baseline?: Pre-admission baseline Does the patient have difficulty walking or climbing stairs?: Yes Weakness of Legs: Both Weakness of Arms/Hands: None  Permission Sought/Granted Permission sought to share information with : Case Manager, Chartered certified accountant granted to share information with : Yes, Verbal Permission Granted     Permission granted to share info w AGENCY: [place] , DME for oxygen        Emotional Assessment Appearance:: Appears stated age       Alcohol / Substance Use: Not Applicable Psych Involvement: No (comment)  Admission diagnosis:  Acute respiratory failure ([place]) [J96.00] Sepsis with acute hypoxic respiratory failure  without septic shock, due to unspecified organism ([place]) [A41.9, R65.20, J96.01] Patient Active Problem List   Diagnosis Date Noted   Acute respiratory failure ([place]) 09/13/2021   CAP (community acquired pneumonia) 09/13/2021   Pneumonia  due to COVID-19 virus 09/13/2021   Dehydration 08/02/2021   Persistent atrial fibrillation ([place]) 08/02/2021   Hypotension    Elevated troponin 07/31/2021   Essential hypertension 07/31/2021   Chronic anticoagulation 07/31/2021   Acute kidney injury superimposed on CKD IV ([place]) 07/31/2021   Elevated LFTs 07/31/2021   Generalized weakness 07/31/2021   Polymyalgia rheumatica ([place]) 07/31/2021   Coronary artery disease    Small cell B-cell lymphoma, unspecified site [facility])    Hypothyroid    Current chronic use of systemic steroids    Elevated lactic acid level    UTI (urinary tract infection)    Bradycardia    Lactic acidosis    Anemia in chronic kidney disease 07/16/2020   Chronic kidney disease, stage IV (severe) ([place]) 06/18/2020   Chronic atrial fibrillation ([place]) 01/22/2017   Personal history of transient ischemic attack (TIA), and cerebral infarction without residual deficits 09/27/2016   Heart failure with preserved left ventricular function (HFpEF) ([place]) 06/25/2015   MGUS (monoclonal gammopathy of unknown significance) 05/07/2012   PCP:  Leonel Ramsay, MD Pharmacy:   [id number] [address], [place] [address] Phone: [phone] Fax: [phone]     Social Determinants of Health (SDOH) Interventions    Readmission Risk Interventions No flowsheet data found.

## 2021-09-16 NOTE — Evaluation (Signed)
Occupational Therapy Evaluation Patient Details Name: Penny White MRN: [id number] DOB: [birth year] Today's Date: 09/16/2021   History of Present Illness 86 y.o. female with medical history significant for chronic diastolic dysfunction CHF with last known LVEF of 55%, stage IV chronic kidney disease, hypertension, hypothyroidism, marginal zone lymphoma, permanent atrial fibrillation who presents to the emergency room via EMS for evaluation of worsening shortness of breath with exertion as well as a productive cough.  Patient had COVID-19 viral infection about 2 weeks ago and was placed on quarantine at their assisted living facility.   Clinical Impression   Patient presenting with decreased Ind in self care, balance, functional mobility/transfers, endurance, and safety awareness. Patient reports living at an ALF and being independent with use of RW for mobility. She ambulates to dining room, uses a pill box for medications, and staff assist her in and out of shower per her report. Pt on 2Ls O2 via [place] and when coughing drops down into the low 80's and needs cuing for pursed lip breathing to return to the 90's. Pt needing min guard- min A for bed mobility and functional transfer.She does fatigue quickly but remains up in recliner chair at end of session. Patient will benefit from acute OT to increase overall independence in the areas of ADLs, functional mobility, and safety awareness in order to safely discharge home.     Recommendations for follow up therapy are one component of a multi-disciplinary discharge planning process, led by the attending physician.  Recommendations may be updated based on patient status, additional functional criteria and insurance authorization.   Follow Up Recommendations  Home health OT    Assistance Recommended at Discharge Intermittent Supervision/Assistance  Functional Status Assessment  Patient has had a recent decline in their functional status and  demonstrates the ability to make significant improvements in function in a reasonable and predictable amount of time.  Equipment Recommendations  BSC/3in1       Precautions / Restrictions Precautions Precautions: Fall      Mobility Bed Mobility Overal bed mobility: Needs Assistance Bed Mobility: Supine to Sit     Supine to sit: Supervision     General bed mobility comments: increased time and min cuing for technique    Transfers Overall transfer level: Needs assistance Equipment used: Rolling walker (2 wheels) Transfers: Sit to/from Stand Sit to Stand: Min assist                  Balance Overall balance assessment: Needs assistance Sitting-balance support: Feet supported Sitting balance-Leahy Scale: Good     Standing balance support: Reliant on assistive device for balance;During functional activity Standing balance-Leahy Scale: Fair                             ADL either performed or assessed with clinical judgement   ADL Overall ADL's : Needs assistance/impaired                                       General ADL Comments: Pt needs min guard- min A for functional mobility/transfers with use of RW     Vision Patient Visual Report: No change from baseline Additional Comments: Pt reports no peripheral vision at baseline            Pertinent Vitals/Pain Pain Assessment: No/denies pain        Extremity/Trunk Assessment  Upper Extremity Assessment Upper Extremity Assessment: Overall WFL for tasks assessed;Generalized weakness   Lower Extremity Assessment Lower Extremity Assessment: Overall WFL for tasks assessed;Generalized weakness       Communication Communication Communication: No difficulties   Cognition Arousal/Alertness: Awake/alert Behavior During Therapy: WFL for tasks assessed/performed Overall Cognitive Status: Within Functional Limits for tasks assessed                                                   Home Living Family/patient expects to be discharged to:: Assisted living                             Home Equipment: Rollator (4 wheels)          Prior Functioning/Environment Prior Level of Function : Needs assist             Mobility Comments: Mod indep with 4WRW inside indep apartment space; ambulates to/from dining hall for meals ADLs Comments: Pt reports staff assist her in and out of shower but that she dresses herself.        OT Problem List: Decreased strength;Decreased activity tolerance;Impaired balance (sitting and/or standing);Decreased safety awareness;Cardiopulmonary status limiting activity      OT Treatment/Interventions: Self-care/ADL training;Therapeutic exercise;Therapeutic activities;Energy conservation;DME and/or AE instruction;Patient/family education;Balance training;Manual therapy    OT Goals(Current goals can be found in the care plan section) Acute Rehab OT Goals Patient Stated Goal: to go home OT Goal Formulation: With patient Time For Goal Achievement: 09/30/21 Potential to Achieve Goals: Good ADL Goals Pt Will Perform Grooming: with modified independence;standing Pt Will Perform Lower Body Dressing: with modified independence;sit to/from stand Pt Will Transfer to Toilet: with modified independence Pt Will Perform Toileting - Clothing Manipulation and hygiene: with modified independence;sit to/from stand  OT Frequency: Min 2X/week   Barriers to D/C:    none known at this time          AM-PAC OT "6 Clicks" Daily Activity     Outcome Measure Help from another person eating meals?: None Help from another person taking care of personal grooming?: A Little Help from another person toileting, which includes using toliet, bedpan, or urinal?: A Little Help from another person bathing (including washing, rinsing, drying)?: A Little Help from another person to put on and taking off regular upper body clothing?: A  Little Help from another person to put on and taking off regular lower body clothing?: A Little 6 Click Score: 19   End of Session Equipment Utilized During Treatment: Rolling walker (2 wheels);Oxygen (2Ls) Nurse Communication: Mobility status  Activity Tolerance: Patient limited by fatigue Patient left: in chair;with call bell/phone within reach;with chair alarm set  OT Visit Diagnosis: Unsteadiness on feet (R26.81);Repeated falls (R29.6);Muscle weakness (generalized) (M62.81)                Time: 8299-3716 OT Time Calculation (min): 23 min Charges:  OT General Charges $OT Visit: 1 Visit OT Evaluation $OT Eval Moderate Complexity: 1 Mod OT Treatments $Therapeutic Activity: 8-22 mins  Darleen Crocker, MS, OTR/L , CBIS ascom [phone]  09/16/21, 2:01 PM

## 2021-09-17 NOTE — NC FL2 (Signed)
[place] LEVEL OF CARE SCREENING TOOL     IDENTIFICATION  Patient Name: Penny White Birthdate: 07/01/1935 Sex: female Admission Date (Current Location): 09/13/2021  [facility] and [place] Number:  Engineering geologist and Address:  [facility], [address] [place], [place] [address]      Provider Number: [id number]  Attending Physician Name and Address:  No att. providers found  Relative Name and Phone Number:  Nathali, Vent [facility])   [phone] [facility] Phone)    Current Level of Care: Hospital Recommended Level of Care: [place] Prior Approval Number:    Date Approved/Denied:   PASRR Number: [id number]  Discharge Plan: Other (Comment) (Assisted Living)    Current Diagnoses: Patient Active Problem List   Diagnosis Date Noted   Acute respiratory failure ([place]) 09/13/2021   CAP (community acquired pneumonia) 09/13/2021   Pneumonia due to COVID-19 virus 09/13/2021   Dehydration 08/02/2021   Persistent atrial fibrillation ([place]) 08/02/2021   Hypotension    Elevated troponin 07/31/2021   Essential hypertension 07/31/2021   Chronic anticoagulation 07/31/2021   Acute kidney injury superimposed on CKD IV ([place]) 07/31/2021   Elevated LFTs 07/31/2021   Generalized weakness 07/31/2021   Polymyalgia rheumatica ([place]) 07/31/2021   Coronary artery disease    Small cell B-cell lymphoma, unspecified site [facility])    Hypothyroid    Current chronic use of systemic steroids    Elevated lactic acid level    UTI (urinary tract infection)    Bradycardia    Lactic acidosis    Anemia in chronic kidney disease 07/16/2020   Chronic kidney disease, stage IV (severe) ([place]) 06/18/2020   Chronic atrial fibrillation ([place]) 01/22/2017   Personal history of transient ischemic attack (TIA), and cerebral infarction without residual deficits 09/27/2016   Heart failure with preserved left ventricular function (HFpEF) ([place]) 06/25/2015    MGUS (monoclonal gammopathy of unknown significance) 05/07/2012    Orientation RESPIRATION BLADDER Height & Weight     Self, Time, Situation, Place  O2 (2L continuous) External catheter Weight: 73 kg Height:  5\' 2"  (157.5 cm)  BEHAVIORAL SYMPTOMS/MOOD NEUROLOGICAL BOWEL NUTRITION STATUS      Incontinent Diet (low sodium heart healthy)  AMBULATORY STATUS COMMUNICATION OF NEEDS Skin   Limited Assist (rollator, min guard) Verbally Bruising (bilat legs)                       Personal Care Assistance Level of Assistance  Bathing, Feeding, Dressing Bathing Assistance: Limited assistance Feeding assistance: Limited assistance Dressing Assistance: Limited assistance     Functional Limitations Info  Sight, Hearing, Speech Sight Info: Adequate Hearing Info: Impaired Speech Info: Adequate    SPECIAL CARE FACTORS FREQUENCY                       Contractures Contractures Info: Not present    Additional Factors Info  Code Status, Allergies Code Status Info: DNR Allergies Info: cipro novocaine           Current Medications (09/17/2021):  This is the current hospital active medication list No current facility-administered medications for this encounter.   Current Outpatient Medications  Medication Sig Dispense Refill   acetaminophen (TYLENOL) 500 MG tablet Take 1 tablet (500 mg total) by mouth every 4 (four) hours as needed for moderate pain. (Patient taking differently: Take 500 mg by mouth every 6 (six) hours as needed for moderate pain.) 30 tablet 0   amoxicillin-clavulanate (  AUGMENTIN) 875-125 MG tablet Take 1 tablet by mouth 2 (two) times daily for 3 days. 6 tablet 0   apixaban (ELIQUIS) 2.5 MG TABS tablet Take 2 tablets (5 mg total) by mouth 2 (two) times daily.     carboxymethylcellulose (REFRESH PLUS) 0.5 % SOLN Place 1 drop into both eyes 3 (three) times daily as needed (dry eyes).     ferrous sulfate 325 (65 FE) MG tablet Take 325 mg by mouth every other day.      levothyroxine (SYNTHROID) 50 MCG tablet Take 50 mcg by mouth daily before breakfast.     Multiple Vitamins-Iron (MULTIVITAMIN/IRON PO) Take 1 tablet by mouth daily.     senna (SENOKOT) 8.6 MG tablet Take 1 tablet (8.6 mg total) by mouth 2 (two) times daily as needed for constipation.     albuterol (VENTOLIN HFA) 108 (90 Base) MCG/ACT inhaler Inhale 2 puffs into the lungs every 6 (six) hours. 8 g 0   ascorbic acid (VITAMIN C) 500 MG tablet Take 1 tablet (500 mg total) by mouth daily. 90 tablet 1   azithromycin (ZITHROMAX) 500 MG tablet Take 1 tablet (500 mg total) by mouth daily for 1 day. 1 tablet 0   guaiFENesin-dextromethorphan (ROBITUSSIN DM) 100-10 MG/5ML syrup Take 10 mLs by mouth every 4 (four) hours as needed for cough. 118 mL 0   predniSONE (DELTASONE) 5 MG tablet Take 1 tablet (5 mg total) by mouth daily with breakfast. Resume home dose after finishing higher doses of prednisone     predniSONE (DELTASONE) 50 MG tablet Take 1 tablet daily for 5 days and then resume your home dose 5 tablet 0   zinc sulfate 220 (50 Zn) MG capsule Take 1 capsule (220 mg total) by mouth daily. 90 capsule 1     Discharge Medications: Please see discharge summary for a list of discharge medications.  Relevant Imaging Results:  Relevant Lab Results:   Additional Information SSN [id number]  Pete Pelt, RN

## 2021-09-18 ENCOUNTER — Emergency Department: Payer: Medicare HMO

## 2021-09-18 ENCOUNTER — Encounter: Payer: Self-pay | Admitting: Emergency Medicine

## 2021-09-18 ENCOUNTER — Inpatient Hospital Stay
Admission: EM | Admit: 2021-09-18 | Discharge: 2021-09-22 | DRG: 951 | Disposition: E | Payer: Medicare HMO | Source: Skilled Nursing Facility | Attending: Internal Medicine | Admitting: Internal Medicine

## 2021-09-18 ENCOUNTER — Telehealth: Payer: Self-pay | Admitting: *Deleted

## 2021-09-18 DIAGNOSIS — C8580 Other specified types of non-Hodgkin lymphoma, unspecified site: Secondary | ICD-10-CM

## 2021-09-18 DIAGNOSIS — Z8661 Personal history of infections of the central nervous system: Secondary | ICD-10-CM | POA: Diagnosis not present

## 2021-09-18 DIAGNOSIS — I5032 Chronic diastolic (congestive) heart failure: Secondary | ICD-10-CM | POA: Diagnosis present

## 2021-09-18 DIAGNOSIS — D649 Anemia, unspecified: Secondary | ICD-10-CM | POA: Diagnosis present

## 2021-09-18 DIAGNOSIS — E039 Hypothyroidism, unspecified: Secondary | ICD-10-CM | POA: Diagnosis present

## 2021-09-18 DIAGNOSIS — F0390 Unspecified dementia without behavioral disturbance: Secondary | ICD-10-CM | POA: Diagnosis present

## 2021-09-18 DIAGNOSIS — U071 COVID-19: Secondary | ICD-10-CM | POA: Diagnosis present

## 2021-09-18 DIAGNOSIS — Z85828 Personal history of other malignant neoplasm of skin: Secondary | ICD-10-CM

## 2021-09-18 DIAGNOSIS — Z515 Encounter for palliative care: Secondary | ICD-10-CM | POA: Diagnosis present

## 2021-09-18 DIAGNOSIS — Z66 Do not resuscitate: Secondary | ICD-10-CM | POA: Diagnosis present

## 2021-09-18 DIAGNOSIS — C8300 Small cell B-cell lymphoma, unspecified site: Secondary | ICD-10-CM | POA: Diagnosis present

## 2021-09-18 DIAGNOSIS — I5030 Unspecified diastolic (congestive) heart failure: Secondary | ICD-10-CM | POA: Diagnosis present

## 2021-09-18 DIAGNOSIS — I482 Chronic atrial fibrillation, unspecified: Secondary | ICD-10-CM | POA: Diagnosis present

## 2021-09-18 DIAGNOSIS — I6389 Other cerebral infarction: Secondary | ICD-10-CM | POA: Diagnosis present

## 2021-09-18 DIAGNOSIS — G9341 Metabolic encephalopathy: Secondary | ICD-10-CM | POA: Diagnosis present

## 2021-09-18 DIAGNOSIS — I130 Hypertensive heart and chronic kidney disease with heart failure and stage 1 through stage 4 chronic kidney disease, or unspecified chronic kidney disease: Secondary | ICD-10-CM | POA: Diagnosis present

## 2021-09-18 DIAGNOSIS — J9601 Acute respiratory failure with hypoxia: Secondary | ICD-10-CM | POA: Diagnosis present

## 2021-09-18 DIAGNOSIS — I4821 Permanent atrial fibrillation: Secondary | ICD-10-CM | POA: Diagnosis present

## 2021-09-18 DIAGNOSIS — J1282 Pneumonia due to coronavirus disease 2019: Secondary | ICD-10-CM | POA: Diagnosis present

## 2021-09-18 DIAGNOSIS — N184 Chronic kidney disease, stage 4 (severe): Secondary | ICD-10-CM | POA: Diagnosis present

## 2021-09-18 DIAGNOSIS — I493 Ventricular premature depolarization: Secondary | ICD-10-CM | POA: Diagnosis present

## 2021-09-18 DIAGNOSIS — R4182 Altered mental status, unspecified: Secondary | ICD-10-CM

## 2021-09-18 DIAGNOSIS — I10 Essential (primary) hypertension: Secondary | ICD-10-CM | POA: Diagnosis present

## 2021-09-18 DIAGNOSIS — Z8673 Personal history of transient ischemic attack (TIA), and cerebral infarction without residual deficits: Secondary | ICD-10-CM

## 2021-09-18 LAB — CBC WITH DIFFERENTIAL/PLATELET
Abs Immature Granulocytes: 0.37 10*3/uL — ABNORMAL HIGH (ref 0.00–0.07)
Basophils Absolute: 0.1 10*3/uL (ref 0.0–0.1)
Basophils Relative: 1 %
Eosinophils Absolute: 0 10*3/uL (ref 0.0–0.5)
Eosinophils Relative: 0 %
HCT: 33.1 % — ABNORMAL LOW (ref 36.0–46.0)
Hemoglobin: 9.7 g/dL — ABNORMAL LOW (ref 12.0–15.0)
Immature Granulocytes: 8 %
Lymphocytes Relative: 5 %
Lymphs Abs: 0.3 10*3/uL — ABNORMAL LOW (ref 0.7–4.0)
MCH: 26.9 pg (ref 26.0–34.0)
MCHC: 29.3 g/dL — ABNORMAL LOW (ref 30.0–36.0)
MCV: 91.9 fL (ref 80.0–100.0)
Monocytes Absolute: 0.3 10*3/uL (ref 0.1–1.0)
Monocytes Relative: 7 %
Neutro Abs: 3.9 10*3/uL (ref 1.7–7.7)
Neutrophils Relative %: 79 %
Platelets: 269 10*3/uL (ref 150–400)
RBC: 3.6 MIL/uL — ABNORMAL LOW (ref 3.87–5.11)
RDW: 18.6 % — ABNORMAL HIGH (ref 11.5–15.5)
WBC: 4.9 10*3/uL (ref 4.0–10.5)
nRBC: 1.2 % — ABNORMAL HIGH (ref 0.0–0.2)

## 2021-09-18 LAB — COMPREHENSIVE METABOLIC PANEL
ALT: 16 U/L (ref 0–44)
AST: 21 U/L (ref 15–41)
Albumin: 2.9 g/dL — ABNORMAL LOW (ref 3.5–5.0)
Alkaline Phosphatase: 72 U/L (ref 38–126)
Anion gap: 9 (ref 5–15)
BUN: 26 mg/dL — ABNORMAL HIGH (ref 8–23)
CO2: 26 mmol/L (ref 22–32)
Calcium: 8.6 mg/dL — ABNORMAL LOW (ref 8.9–10.3)
Chloride: 108 mmol/L (ref 98–111)
Creatinine, Ser: 1.09 mg/dL — ABNORMAL HIGH (ref 0.44–1.00)
GFR, Estimated: 50 mL/min — ABNORMAL LOW (ref >60)
Glucose, Bld: 181 mg/dL — ABNORMAL HIGH (ref 70–99)
Potassium: 3.6 mmol/L (ref 3.5–5.1)
Sodium: 143 mmol/L (ref 135–145)
Total Bilirubin: 0.9 mg/dL (ref 0.3–1.2)
Total Protein: 6.2 g/dL — ABNORMAL LOW (ref 6.5–8.1)

## 2021-09-18 LAB — CULTURE, BLOOD (ROUTINE X 2)
Culture: NO GROWTH
Culture: NO GROWTH
Special Requests: ADEQUATE
Special Requests: ADEQUATE

## 2021-09-18 LAB — TROPONIN I (HIGH SENSITIVITY): Troponin I (High Sensitivity): 83 ng/L — ABNORMAL HIGH (ref <18)

## 2021-09-18 MED ORDER — HALOPERIDOL LACTATE 2 MG/ML PO CONC
0.5000 mg | ORAL | Status: DC | PRN
  Filled 2021-09-18: qty 0.3

## 2021-09-18 MED ORDER — HALOPERIDOL LACTATE 5 MG/ML IJ SOLN: 0.5000 mg | INTRAMUSCULAR | Status: DC | PRN

## 2021-09-18 MED ORDER — GLYCOPYRROLATE 0.2 MG/ML IJ SOLN
0.2000 mg | INTRAMUSCULAR | Status: DC | PRN
  Filled 2021-09-18: qty 1

## 2021-09-18 MED ORDER — MORPHINE SULFATE (PF) 2 MG/ML IV SOLN
2.0000 mg | INTRAVENOUS | Status: DC | PRN
  Administered 2021-09-18: 2 mg via INTRAVENOUS
  Filled 2021-09-18: qty 1

## 2021-09-18 MED ORDER — IPRATROPIUM-ALBUTEROL 0.5-2.5 (3) MG/3ML IN SOLN
3.0000 mL | Freq: Four times a day (QID) | RESPIRATORY_TRACT | Status: DC
  Administered 2021-09-18: 23:00:00 3 mL via RESPIRATORY_TRACT
  Filled 2021-09-18: qty 3

## 2021-09-18 MED ORDER — HALOPERIDOL 0.5 MG PO TABS
0.5000 mg | ORAL_TABLET | ORAL | Status: DC | PRN
  Filled 2021-09-18: qty 1

## 2021-09-18 MED ORDER — LORAZEPAM 2 MG/ML PO CONC
1.0000 mg | ORAL | Status: DC | PRN
  Filled 2021-09-18: qty 0.5

## 2021-09-18 MED ORDER — GLYCOPYRROLATE 1 MG PO TABS
1.0000 mg | ORAL_TABLET | ORAL | Status: DC | PRN
  Filled 2021-09-18: qty 1

## 2021-09-18 MED ORDER — LORAZEPAM 2 MG/ML IJ SOLN: 1.0000 mg | INTRAMUSCULAR | Status: DC | PRN

## 2021-09-18 MED ORDER — ONDANSETRON HCL 4 MG/2ML IJ SOLN: 4.0000 mg | Freq: Four times a day (QID) | INTRAMUSCULAR | Status: DC | PRN

## 2021-09-18 MED ORDER — FUROSEMIDE 10 MG/ML IJ SOLN: 40.0000 mg | Freq: Once | INTRAMUSCULAR | Status: DC

## 2021-09-18 MED ORDER — LORAZEPAM 1 MG PO TABS: 1.0000 mg | ORAL_TABLET | ORAL | Status: DC | PRN

## 2021-09-18 MED ORDER — ONDANSETRON 4 MG PO TBDP: 4.0000 mg | ORAL_TABLET | Freq: Four times a day (QID) | ORAL | Status: DC | PRN

## 2021-09-18 NOTE — Telephone Encounter (Signed)
Ok to do hospice referral. I can be the attending

## 2021-09-18 NOTE — ED Notes (Signed)
Phlebotomy called to obtain labs.

## 2021-09-18 NOTE — Telephone Encounter (Signed)
Anderson Malta from [employer] called stating that patient is not doing well since discharged form hospital and that the son is asking for Hospice referral tp be made ASAP! Please advise if you will refer and sign orders. Her PCP is out of town so they cannot reach him

## 2021-09-18 NOTE — ED Provider Notes (Signed)
----------------------------------------- °  11:42 PM on 08/26/2021 -----------------------------------------   Patient is not keeping her oxygen on.  Son wishes to stop trying to keep the oxygen on her by giving her Ativan.  He is excepting of giving her some morphine to keep her comfortable.  Discussed with hospitalist; patient's saturations already in the 60s, will cancel hospitalist consult for now.  May reconsult them if patient is still hanging on in an hour.   ----------------------------------------- 2:13 AM on September 30, 2021 -----------------------------------------   Patient resting after administration of morphine.  Heart rate 74, saturations on nasal cannula 96%.  Have discussed with hospitalist services for admission for comfort care.   Paulette Blanch, MD 2021-09-30 [phone]

## 2021-09-18 NOTE — Telephone Encounter (Signed)
Order called to [facility] order sent to [facility] electronically

## 2021-09-18 NOTE — ED Triage Notes (Signed)
Pt presents to the ED via EMS from [facility] (assisted living) with a complaint of AMS & FTT per family. The patient has not been her "normal self" today. She was recently D/C on Monday following treatment for PNA and has a Hospice care order in place to start tomorrow. The patients O2 sats were low with EMS ~70% on RA and was placed on 10L [place] which improved her O2 to 100%.  Pt currently on 4L [place] sating at 100%.

## 2021-09-18 NOTE — ED Provider Notes (Signed)
[facility] Emergency Department Provider Note    Event Date/Time   First MD Initiated Contact with Patient 09/20/2021 2228     (approximate)  I have reviewed the triage vital signs and the nursing notes.   HISTORY  Chief Complaint Altered Mental Status  Level V Caveat;  AMS - resp failure  HPI Penny White is a 86 y.o. female extensive past medical history including dementia as well as recent admission for pneumonia recent diagnosis of COVID as well as diastolic CHF is stage IV CKD as well as marginal zone lymphoma and A. fib presents to the ER for evaluation of confusion and significant decline over the past few days.  Patient arrives with labored breathing.  Unable to provide any additional history.  Son at bedside states patient is DNR DNI.  They asked to have called hospice to come initiate care but have not established with them.  He states that he would want her to receive oxygen for comfort but would not want any escalation of care and understands that she has been significantly decreased deteriorating that was not expecting it to be this quickly.  Past Medical History:  Diagnosis Date   Actinic keratosis    Arthritis    Atherosclerosis    Chronic heart failure with preserved ejection fraction (HFpEF) ([place])    a. 01/2020 Echo: EF >55%, mild LVH, nl RV fxn, Mild AI/TR, triv MR/PR; b. 07/2021 Echo: EF 60-65%, no rwma, nl RV fxn. Mildly dil LA. Mild MR/AI. Asc Ao 31mm.   CKD (chronic kidney disease), stage IV (HCC)    Dementia (HCC)    Dyspnea    Encephalitis 2020   Gout    Hyperlipemia    Hypertension    Hypothyroid    Marginal zone lymphoma (HCC)    MGUS (monoclonal gammopathy of unknown significance)    Nonobstructive Coronary artery disease    a. 2016 MV: very mild ant ischemia; b. 2019 MV: worsening ant ischemia; c 2019 Cath: nonobs dzs-->Med rx.   Permanent atrial fibrillation (HCC)    a. CHA2DS2VASc = 7-->eliquis.   Polymyalgia  rheumatica (HCC)    Shingles 2020   Small cell B-cell lymphoma, unspecified site ([place])    Squamous cell carcinoma of skin    L arm removed by Dr Phillip Heal    Squamous cell carcinoma of skin 08/20/2021   R post shoulder - ED&C   Stroke ([place]) 2018   + 2021.  Peripheral vision damage.   Subdural hemorrhage ([place]) 2020   History reviewed. No pertinent family history. Past Surgical History:  Procedure Laterality Date   CARDIAC CATHETERIZATION     CATARACT EXTRACTION W/PHACO Left 12/04/2020   Procedure: CATARACT EXTRACTION PHACO AND INTRAOCULAR LENS PLACEMENT (IOC) LEFT 6.95 00:45.9;  Surgeon: Birder Robson, MD;  Location: [place];  Service: Ophthalmology;  Laterality: Left;   CATARACT EXTRACTION W/PHACO Right 12/18/2020   Procedure: CATARACT EXTRACTION PHACO AND INTRAOCULAR LENS PLACEMENT (IOC) RIGHT 7.07 00:42.9;  Surgeon: Birder Robson, MD;  Location: [place];  Service: Ophthalmology;  Laterality: Right;   CHOLECYSTECTOMY  1978   COLONOSCOPY     LYMPH NODE BIOPSY N/A 05/17/2021   Procedure: RIGHT OCCIPITAL LYMPH NODE BIOPSY;  Surgeon: Robert Bellow, MD;  Location: [facility];  Service: General;  Laterality: N/A;  occipital node biopsy possible axillary node biopsy   REPLACEMENT TOTAL KNEE Bilateral    Right 06/02/03,  left 04/20/06   TUBAL LIGATION     Patient Active  Problem List   Diagnosis Date Noted   Acute hypoxemic respiratory failure due to COVID-19 ([place]) 09/14/2021   Acute respiratory failure ([place]) 09/13/2021   CAP (community acquired pneumonia) 09/13/2021   Pneumonia due to COVID-19 virus 09/13/2021   Dehydration 08/02/2021   Persistent atrial fibrillation ([place]) 08/02/2021   Hypotension    Elevated troponin 07/31/2021   Essential hypertension 07/31/2021   Chronic anticoagulation 07/31/2021   Acute kidney injury superimposed on CKD IV ([place]) 07/31/2021   Elevated LFTs 07/31/2021   Generalized weakness 07/31/2021   Polymyalgia rheumatica ([place])  07/31/2021   Coronary artery disease    Small cell B-cell lymphoma, unspecified site [facility])    Hypothyroid    Current chronic use of systemic steroids    Elevated lactic acid level    UTI (urinary tract infection)    Bradycardia    Lactic acidosis    Anemia in chronic kidney disease 07/16/2020   Chronic kidney disease, stage IV (severe) ([place]) 06/18/2020   Chronic atrial fibrillation ([place]) 01/22/2017   Personal history of transient ischemic attack (TIA), and cerebral infarction without residual deficits 09/27/2016   Heart failure with preserved left ventricular function (HFpEF) ([place]) 06/25/2015   MGUS (monoclonal gammopathy of unknown significance) 05/07/2012      Prior to Admission medications   Medication Sig Start Date End Date Taking? Authorizing Provider  acetaminophen (TYLENOL) 500 MG tablet Take 1 tablet (500 mg total) by mouth every 4 (four) hours as needed for moderate pain. Patient taking differently: Take 500 mg by mouth every 6 (six) hours as needed for moderate pain. 05/17/21   Robert Bellow, MD  albuterol (VENTOLIN HFA) 108 (90 Base) MCG/ACT inhaler Inhale 2 puffs into the lungs every 6 (six) hours. 09/16/21   Lorella Nimrod, MD  amoxicillin-clavulanate (AUGMENTIN) 875-125 MG tablet Take 1 tablet by mouth 2 (two) times daily for 3 days. 09/16/21 10-09-21  Lorella Nimrod, MD  apixaban (ELIQUIS) 2.5 MG TABS tablet Take 2 tablets (5 mg total) by mouth 2 (two) times daily. 08/03/21   Richarda Osmond, MD  ascorbic acid (VITAMIN C) 500 MG tablet Take 1 tablet (500 mg total) by mouth daily. 09/17/21   Lorella Nimrod, MD  azithromycin (ZITHROMAX) 500 MG tablet Take 1 tablet (500 mg total) by mouth daily for 1 day. 09/17/21 09/20/2021  Lorella Nimrod, MD  carboxymethylcellulose (REFRESH PLUS) 0.5 % SOLN Place 1 drop into both eyes 3 (three) times daily as needed (dry eyes).    [provider]  ferrous sulfate 325 (65 FE) MG tablet Take 325 mg by mouth every other day.     [provider]  guaiFENesin-dextromethorphan (ROBITUSSIN DM) 100-10 MG/5ML syrup Take 10 mLs by mouth every 4 (four) hours as needed for cough. 09/16/21   Lorella Nimrod, MD  levothyroxine (SYNTHROID) 50 MCG tablet Take 50 mcg by mouth daily before breakfast.    [provider]  Multiple Vitamins-Iron (MULTIVITAMIN/IRON PO) Take 1 tablet by mouth daily.    [provider]  predniSONE (DELTASONE) 5 MG tablet Take 1 tablet (5 mg total) by mouth daily with breakfast. Resume home dose after finishing higher doses of prednisone 09/16/21   Lorella Nimrod, MD  predniSONE (DELTASONE) 50 MG tablet Take 1 tablet daily for 5 days and then resume your home dose 09/17/21   Lorella Nimrod, MD  senna (SENOKOT) 8.6 MG tablet Take 1 tablet (8.6 mg total) by mouth 2 (two) times daily as needed for constipation. 08/03/21   Richarda Osmond, MD  zinc sulfate 220 (50 Zn) MG capsule Take 1 capsule (220 mg total) by mouth daily. 09/17/21   Lorella Nimrod, MD    Allergies Ciprofloxacin and Novocain [procaine]    Social History Social History   Tobacco Use   Smoking status: Never   Smokeless tobacco: Never  Vaping Use   Vaping Use: Never used  Substance Use Topics   Alcohol use: Not Currently    Comment: occasional - wine small amount   Drug use: Not Currently    Review of Systems Patient denies headaches, rhinorrhea, blurry vision, numbness, shortness of breath, chest pain, edema, cough, abdominal pain, nausea, vomiting, diarrhea, dysuria, fevers, rashes or hallucinations unless otherwise stated above in HPI. ____________________________________________   PHYSICAL EXAM:  VITAL SIGNS: Vitals:   09/20/2021 2031 08/23/2021 2245  BP:  (!) 130/57  Pulse:  80  Resp:  (!) 26  Temp:    SpO2: 98% 100%    Constitutional: critically ill appearing, drowsy and encephalopathic  Eyes: Conjunctivae are normal.  Head: Atraumatic. Nose: No congestion/rhinnorhea. Mouth/Throat: Mucous  membranes are moist.   Neck: No stridor. Painless ROM.  Cardiovascular: Normal rate, regular rhythm. Grossly normal heart sounds.  Good peripheral circulation. Respiratory: tachypnea with bibasilar crackles, no wheeze noted Gastrointestinal: Soft and nontender. No distention. No abdominal bruits. No CVA tenderness. Genitourinary:  Musculoskeletal: No lower extremity tenderness nor edema.  No joint effusions. Neurologic: drowsy, not cooperative with exam, not following exams Skin:  Skin is warm, dry and intact. No rash noted. Psychiatric: unable to assess  ____________________________________________   LABS (all labs ordered are listed, but only abnormal results are displayed)  Results for orders placed or performed during the hospital encounter of 08/23/2021 (from the past 24 hour(s))  CBC with Differential     Status: Abnormal   Collection Time: 09/15/2021  9:31 PM  Result Value Ref Range   WBC 4.9 4.0 - 10.5 K/uL   RBC 3.60 (L) 3.87 - 5.11 MIL/uL   Hemoglobin 9.7 (L) 12.0 - 15.0 g/dL   HCT 33.1 (L) 36.0 - 46.0 %   MCV 91.9 80.0 - 100.0 fL   MCH 26.9 26.0 - 34.0 pg   MCHC 29.3 (L) 30.0 - 36.0 g/dL   RDW 18.6 (H) 11.5 - 15.5 %   Platelets 269 150 - 400 K/uL   nRBC 1.2 (H) 0.0 - 0.2 %   Neutrophils Relative % 79 %   Neutro Abs 3.9 1.7 - 7.7 K/uL   Lymphocytes Relative 5 %   Lymphs Abs 0.3 (L) 0.7 - 4.0 K/uL   Monocytes Relative 7 %   Monocytes Absolute 0.3 0.1 - 1.0 K/uL   Eosinophils Relative 0 %   Eosinophils Absolute 0.0 0.0 - 0.5 K/uL   Basophils Relative 1 %   Basophils Absolute 0.1 0.0 - 0.1 K/uL   Immature Granulocytes 8 %   Abs Immature Granulocytes 0.37 (H) 0.00 - 0.07 K/uL  Troponin I (High Sensitivity)     Status: Abnormal   Collection Time: 08/26/2021  9:31 PM  Result Value Ref Range   Troponin I (High Sensitivity) 83 (H) <18 ng/L  Comprehensive metabolic panel     Status: Abnormal   Collection Time: 09/10/2021  9:31 PM  Result Value Ref Range   Sodium 143 135 -  145 mmol/L   Potassium 3.6 3.5 - 5.1 mmol/L   Chloride 108 98 - 111 mmol/L   CO2 26 22 - 32 mmol/L   Glucose, Bld 181 (H) 70 - 99 mg/dL  BUN 26 (H) 8 - 23 mg/dL   Creatinine, Ser 1.09 (H) 0.44 - 1.00 mg/dL   Calcium 8.6 (L) 8.9 - 10.3 mg/dL   Total Protein 6.2 (L) 6.5 - 8.1 g/dL   Albumin 2.9 (L) 3.5 - 5.0 g/dL   AST 21 15 - 41 U/L   ALT 16 0 - 44 U/L   Alkaline Phosphatase 72 38 - 126 U/L   Total Bilirubin 0.9 0.3 - 1.2 mg/dL   GFR, Estimated 50 (L) >60 mL/min   Anion gap 9 5 - 15   ____________________________________________  EKG My review and personal interpretation at Time:  20:37  Indication: ams  Rate: 85  Rhythm: afib Axis: normal Other: no stemi, nonspecific t wave abn ____________________________________________  RADIOLOGY  I personally reviewed all radiographic images ordered to evaluate for the above acute complaints and reviewed radiology reports and findings.  These findings were personally discussed with the patient.  Please see medical record for radiology report.  ____________________________________________   PROCEDURES  Procedure(s) performed:  Procedures    Critical Care performed: no ____________________________________________   INITIAL IMPRESSION / ASSESSMENT AND PLAN / ED COURSE  Pertinent labs & imaging results that were available during my care of the patient were reviewed by me and considered in my medical decision making (see chart for details).   DDX: sepsis, aki, chf, electrolyte abn, cva, tia  ADAIJAH ENDRES is a 86 y.o. who presents to the ED with findings concerning for significant deterioration in her status.  Clinically patient is very unwell arrives with DNR.  In discussion with patient's family appears that her goals of care are comfort only.  Blood work sent for above differential.  Imaging ordered to further evaluate.  Have ordered pain medication including IV morphine to help with dyspnea.  Will order nebulizer.  Clinical  Course as of 09/09/2021 2320  Wed Sep 18, 2021  2303 Patient's family updated.  Given her respiratory state and findings we will keep with comfort care think she does have some mild edema chest x-ray I am less convinced of pneumonia given lack of fever or white count likely infiltrates secondary to covid.  Will give dose of lasix for edema as may help with dyspnea.  Case discussed with hospitalist. [PR]    Clinical Course User Index [PR] Merlyn Lot, MD    The patient was evaluated in Emergency Department today for the symptoms described in the history of present illness. He/she was evaluated in the context of the global COVID-19 pandemic, which necessitated consideration that the patient might be at risk for infection with the SARS-CoV-2 virus that causes COVID-19. Institutional protocols and algorithms that pertain to the evaluation of patients at risk for COVID-19 are in a state of rapid change based on information released by regulatory bodies including the CDC and federal and state organizations. These policies and algorithms were followed during the patient's care in the ED.  As part of my medical decision making, I reviewed the following data within the [place] notes reviewed and incorporated, Labs reviewed, notes from prior ED visits and [place] Controlled Substance Database   ____________________________________________   FINAL CLINICAL IMPRESSION(S) / ED DIAGNOSES  Final diagnoses:  Altered mental status, unspecified altered mental status type  Acute respiratory failure with hypoxia (HCC)      NEW MEDICATIONS STARTED DURING THIS VISIT:  New Prescriptions   No medications on file     Note:  This document was prepared using Dragon voice  recognition software and may include unintentional dictation errors.    Merlyn Lot, MD 09/17/2021 [phone]

## 2021-09-19 DIAGNOSIS — J9601 Acute respiratory failure with hypoxia: Secondary | ICD-10-CM

## 2021-09-19 DIAGNOSIS — U071 COVID-19: Secondary | ICD-10-CM

## 2021-09-19 DIAGNOSIS — G9341 Metabolic encephalopathy: Secondary | ICD-10-CM

## 2021-09-19 DIAGNOSIS — J1282 Pneumonia due to coronavirus disease 2019: Secondary | ICD-10-CM

## 2021-09-19 MED ORDER — PREDNISONE 10 MG PO TABS: 5.0000 mg | ORAL_TABLET | Freq: Every day | ORAL | Status: DC

## 2021-09-19 MED ORDER — HALOPERIDOL 0.5 MG PO TABS
0.5000 mg | ORAL_TABLET | ORAL | Status: DC | PRN
  Filled 2021-09-19: qty 1

## 2021-09-19 MED ORDER — APIXABAN 5 MG PO TABS: 5.0000 mg | ORAL_TABLET | Freq: Two times a day (BID) | ORAL | Status: DC

## 2021-09-19 MED ORDER — TRAZODONE HCL 50 MG PO TABS: 25.0000 mg | ORAL_TABLET | Freq: Every evening | ORAL | Status: DC | PRN

## 2021-09-19 MED ORDER — CARBOXYMETHYLCELLULOSE SODIUM 0.5 % OP SOLN: 1.0000 [drp] | Freq: Three times a day (TID) | OPHTHALMIC | Status: DC | PRN

## 2021-09-19 MED ORDER — ALBUTEROL SULFATE HFA 108 (90 BASE) MCG/ACT IN AERS
2.0000 | INHALATION_SPRAY | Freq: Four times a day (QID) | RESPIRATORY_TRACT | Status: DC
  Filled 2021-09-19: qty 6.7

## 2021-09-19 MED ORDER — GUAIFENESIN-DM 100-10 MG/5ML PO SYRP: 10.0000 mL | ORAL_SOLUTION | ORAL | Status: DC | PRN

## 2021-09-19 MED ORDER — DIPHENHYDRAMINE HCL 50 MG/ML IJ SOLN: 12.5000 mg | INTRAMUSCULAR | Status: DC | PRN

## 2021-09-19 MED ORDER — FERROUS SULFATE 325 (65 FE) MG PO TABS: 325.0000 mg | ORAL_TABLET | ORAL | Status: DC

## 2021-09-19 MED ORDER — GLYCOPYRROLATE 1 MG PO TABS
1.0000 mg | ORAL_TABLET | ORAL | Status: DC | PRN
  Filled 2021-09-19: qty 1

## 2021-09-19 MED ORDER — ONDANSETRON 4 MG PO TBDP: 4.0000 mg | ORAL_TABLET | Freq: Four times a day (QID) | ORAL | Status: DC | PRN

## 2021-09-19 MED ORDER — SENNA 8.6 MG PO TABS: 1.0000 | ORAL_TABLET | Freq: Two times a day (BID) | ORAL | Status: DC | PRN

## 2021-09-19 MED ORDER — BIOTENE DRY MOUTH MT LIQD
15.0000 mL | OROMUCOSAL | Status: DC | PRN
  Filled 2021-09-19: qty 15

## 2021-09-19 MED ORDER — HALOPERIDOL LACTATE 5 MG/ML IJ SOLN: 0.5000 mg | INTRAMUSCULAR | Status: DC | PRN

## 2021-09-19 MED ORDER — ACETAMINOPHEN 325 MG RE SUPP: 650.0000 mg | Freq: Four times a day (QID) | RECTAL | Status: DC | PRN

## 2021-09-19 MED ORDER — LEVOTHYROXINE SODIUM 50 MCG PO TABS: 50.0000 ug | ORAL_TABLET | Freq: Every day | ORAL | Status: DC

## 2021-09-19 MED ORDER — ACETAMINOPHEN 325 MG PO TABS: 650.0000 mg | ORAL_TABLET | Freq: Four times a day (QID) | ORAL | Status: DC | PRN

## 2021-09-19 MED ORDER — POLYVINYL ALCOHOL 1.4 % OP SOLN
1.0000 [drp] | Freq: Four times a day (QID) | OPHTHALMIC | Status: DC | PRN
  Filled 2021-09-19: qty 15

## 2021-09-19 MED ORDER — MORPHINE SULFATE (CONCENTRATE) 10 MG/0.5ML PO SOLN: 5.0000 mg | ORAL | Status: DC | PRN

## 2021-09-19 MED ORDER — GLYCOPYRROLATE 0.2 MG/ML IJ SOLN
0.2000 mg | INTRAMUSCULAR | Status: DC | PRN
  Filled 2021-09-19: qty 1

## 2021-09-19 MED ORDER — MORPHINE SULFATE (PF) 2 MG/ML IV SOLN
1.0000 mg | INTRAVENOUS | Status: DC | PRN
  Administered 2021-09-19: 04:00:00 1 mg via INTRAVENOUS
  Filled 2021-09-19: qty 1

## 2021-09-19 MED ORDER — HALOPERIDOL LACTATE 2 MG/ML PO CONC
0.5000 mg | ORAL | Status: DC | PRN
  Filled 2021-09-19: qty 0.3

## 2021-09-19 MED ORDER — ONDANSETRON HCL 4 MG/2ML IJ SOLN: 4.0000 mg | Freq: Four times a day (QID) | INTRAMUSCULAR | Status: DC | PRN

## 2021-09-22 NOTE — Death Summary Note (Signed)
DEATH SUMMARY   Patient Details  Name: Penny White DOB: White  Admission/Discharge Information   Admit Date:  Oct 08, 2021  Date of Death: Date of Death: 09-Oct-2021  Time of Death: Time of Death: 0856  Length of Stay: 1  Referring Physician: Leonel Ramsay, MD   Reason(s) for Hospitalization  Altered mental status, respiratory failure  Diagnoses  Preliminary cause of death: Pneumonia due to COVID-19 virus Secondary Diagnoses (including complications and co-morbidities):  Principal Problem:   Acute hypoxemic respiratory failure due to COVID-19 [facility]) Active Problems:   Chronic kidney disease, stage IV (severe) (HCC)   Chronic atrial fibrillation (HCC)   Essential hypertension   Heart failure with preserved left ventricular function (HFpEF) (HCC)   Small cell B-cell lymphoma, unspecified site [facility])   Pneumonia due to COVID-19 virus   Brief Hospital Course (including significant findings, care, treatment, and services provided and events leading to death)  Penny White is a 87 y.o. year old female with past medical history significant for chronic diastolic congestive heart failure, CKD stage IV, essential hypertension, hypothyroidism, dementia, marginal zone B-cell lymphoma, permanent atrial fibrillation who presented to [facility] ED on 2023-10-09 from her assisted living facility with acute onset altered mental status, worsening respiratory failure with recent COVID-19 pneumonia.  Patient was noted to have labored breathing and worsening mental status.  Patient's family was planning to call hospice to establish care; but not has not been established as of yet.  Family desired to have her on oxygen for comfort without any further escalation of care.  In the ED, temperature 99.9 F, HR 82, RR 20, BP 109/81, SPO2 100% on 10 L NRB.  Sodium 143, potassium 3.6, chloride 108, CO2 26, glucose 181, BUN 26, creatinine 1.09.  AST 21, ALT 16, total bilirubin 0.9.  High  sensitive troponin 83.  WBC 4.9, hemoglobin 9.7, platelets 269.  CT head without contrast with no evidence of acute intracranial abnormality with noted encephalomalacia within the left greater than right occipital lobe consistent with chronic infarcts, infarct in the right occipital lobe new as compared with 12/26/19 CT, atrophy and chronic small vessel ischemic changes of the white matter.  Chest x-ray with cardiac enlargement with small bilateral pleural effusions and increasing bilateral perihilar and basilar infiltrates.  Hospital service was consulted for further evaluation and management with anticipated death on comfort measures.   Hospital course:  Acute metabolic encephalopathy, POA COVID-19 viral pneumonia Acute hypoxic respiratory failure Right occipital lobe CVA, acute versus subacute  Patient presenting to the ED from ALF after recent hospitalization from COVID-19 viral pneumonia.  Patient's mental status is now worsened with increasing respiratory distress and now unresponsive.  Patient with long history of chronic medical conditions to include lymphoma, CKD stage IV and permanent atrial fibrillation.  Family decided to transition care to a more comfort approach and was requesting hospice.  Patient was admitted to the hospitalist service under comfort measures and was given morphine as needed.  Patient passed away at bedside with family present at 0856 on October 09, 2021.  Condolences were offered and chaplain services were given.    Pertinent Labs and Studies  Significant Diagnostic Studies CT HEAD WO CONTRAST (5MM)  Result Date: 10/08/2021 CLINICAL DATA:  Mental status change EXAM: CT HEAD WITHOUT CONTRAST TECHNIQUE: Contiguous axial images were obtained from the base of the skull through the vertex without intravenous contrast. COMPARISON:  CT brain 12/28/2019 FINDINGS: Brain: No acute territorial infarction, hemorrhage, or intracranial mass. Encephalomalacia within the  left greater than  right occipital lobes consistent with chronic infarcts, right occipital infarct is new since the CT from 2021. Moderate atrophy. Mild chronic small vessel ischemic changes of the white matter. Stable ventricle size. Vascular: No hyperdense vessels.  Carotid vascular calcification Skull: Normal. Negative for fracture or focal lesion. Sinuses/Orbits: No acute finding. Other: None IMPRESSION: 1. No definite CT evidence for acute intracranial abnormality. 2. Encephalomalacia within the left greater than right occipital lobes consistent with chronic infarcts. Infarct in the right occipital lobe is new as compared with 2021 CT. 3. Atrophy and chronic small vessel ischemic changes of the white matter Electronically Signed   By: Donavan Foil M.D.   On: 08/24/2021 22:38   DG Chest Portable 1 View  Result Date: 08/27/2021 CLINICAL DATA:  Respiratory distress EXAM: PORTABLE CHEST 1 VIEW COMPARISON:  09/13/2021 FINDINGS: Cardiac enlargement. Increasing patchy bilateral perihilar and basilar infiltrates. Changes could represent edema or multifocal pneumonia. Small pleural effusions. No pneumothorax. Mediastinal contours appear intact. IMPRESSION: Cardiac enlargement with small bilateral pleural effusions. Increasing bilateral perihilar and basilar infiltrates. Electronically Signed   By: Lucienne Capers M.D.   On: 08/23/2021 22:40   DG Chest Port 1 View  Result Date: 09/13/2021 CLINICAL DATA:  Hypoxia, COVID EXAM: PORTABLE CHEST 1 VIEW COMPARISON:  07/31/2021 FINDINGS: Multifocal patchy opacities in the lungs bilaterally, most prominent in the left upper lobe. Small layering right pleural effusion. No pneumothorax. Mild cardiomegaly.  Thoracic aortic atherosclerosis. IMPRESSION: Multifocal pneumonia in this patient with known COVID. Small right pleural effusion. Electronically Signed   By: Julian Hy M.D.   On: 09/13/2021 14:36    Microbiology Recent Results (from the past 240 hour(s))  Blood Culture  (routine x 2)     Status: None   Collection Time: 09/13/21  2:04 PM   Specimen: BLOOD  Result Value Ref Range Status   Specimen Description BLOOD BLOOD RIGHT WRIST  Final   Special Requests   Final    BOTTLES DRAWN AEROBIC AND ANAEROBIC Blood Culture adequate volume   Culture   Final    NO GROWTH 5 DAYS Performed at [facility], [address] [place], [place] [address]    Report Status 09/04/2021 FINAL  Final  Blood Culture (routine x 2)     Status: None   Collection Time: 09/13/21  2:09 PM   Specimen: BLOOD  Result Value Ref Range Status   Specimen Description BLOOD RIGHT ANTECUBITAL  Final   Special Requests   Final    BOTTLES DRAWN AEROBIC AND ANAEROBIC Blood Culture adequate volume   Culture   Final    NO GROWTH 5 DAYS Performed at [facility], [address] [place], [place] [address]    Report Status 08/26/2021 FINAL  Final  Resp Panel by RT-PCR (Flu A&B, Covid) Nasopharyngeal Swab     Status: Abnormal   Collection Time: 09/13/21  3:02 PM   Specimen: Nasopharyngeal Swab; Nasopharyngeal(NP) swabs in vial transport medium  Result Value Ref Range Status   SARS Coronavirus 2 by RT PCR POSITIVE (A) NEGATIVE Final    Comment: (NOTE) SARS-CoV-2 target nucleic acids are DETECTED.  The SARS-CoV-2 RNA is generally detectable in upper respiratory specimens during the acute phase of infection. Positive results are indicative of the presence of the identified virus, but do not rule out bacterial infection or co-infection with other pathogens not detected by the test. Clinical correlation with patient history and other diagnostic information is necessary to determine patient infection status.  The expected result is Negative.  Fact Sheet for Patients: EntrepreneurPulse.com.au  Fact Sheet for Healthcare Providers: IncredibleEmployment.be  This test is not yet approved or cleared by the [place] FDA and  has been  authorized for detection and/or diagnosis of SARS-CoV-2 by FDA under an Emergency Use Authorization (EUA).  This EUA will remain in effect (meaning this test can be used) for the duration of  the COVID-19 declaration under Section 564(b)(1) of the A ct, 21 U.S.C. section 360bbb-3(b)(1), unless the authorization is terminated or revoked sooner.     Influenza A by PCR NEGATIVE NEGATIVE Final   Influenza B by PCR NEGATIVE NEGATIVE Final    Comment: (NOTE) The Xpert Xpress SARS-CoV-2/FLU/RSV plus assay is intended as an aid in the diagnosis of influenza from Nasopharyngeal swab specimens and should not be used as a sole basis for treatment. Nasal washings and aspirates are unacceptable for Xpert Xpress SARS-CoV-2/FLU/RSV testing.  Fact Sheet for Patients: EntrepreneurPulse.com.au  Fact Sheet for Healthcare Providers: IncredibleEmployment.be  This test is not yet approved or cleared by the [place] FDA and has been authorized for detection and/or diagnosis of SARS-CoV-2 by FDA under an Emergency Use Authorization (EUA). This EUA will remain in effect (meaning this test can be used) for the duration of the COVID-19 declaration under Section 564(b)(1) of the Act, 21 U.S.C. section 360bbb-3(b)(1), unless the authorization is terminated or revoked.  Performed at [facility], [address], [place], [place] [address]   Urine Culture     Status: Abnormal   Collection Time: 09/13/21  4:56 PM   Specimen: Urine, Random  Result Value Ref Range Status   Specimen Description   Final    URINE, RANDOM Performed at [facility], [address], [place], [place] [address]    Special Requests   Final    NONE Performed at [facility], [address] [place], [place] [address]    Culture (A)  Final    <10,000 COLONIES/mL INSIGNIFICANT GROWTH Performed at [facility], [place] [address] [place], [place] [address]     Report Status 09/16/2021 FINAL  Final  MRSA Next Gen by PCR, Nasal     Status: None   Collection Time: 09/14/21  4:54 PM   Specimen: Nasal Mucosa; Nasal Swab  Result Value Ref Range Status   MRSA by PCR Next Gen NOT DETECTED NOT DETECTED Final    Comment: (NOTE) The GeneXpert MRSA Assay (FDA approved for NASAL specimens only), is one component of a comprehensive MRSA colonization surveillance program. It is not intended to diagnose MRSA infection nor to guide or monitor treatment for MRSA infections. Test performance is not FDA approved in patients less than 42 years old. Performed at [facility], [address] [place],  [address]     Lab Basic Metabolic Panel: Recent Labs  Lab 09/13/21 1404 09/14/21 0527 09/15/21 0439 08/30/2021 2131  NA 138 140 139 143  K 4.3 4.2 4.4 3.6  CL 105 110 103 108  CO2 24 22 26 26   GLUCOSE 106* 69* 188* 181*  BUN 23 24* 25* 26*  CREATININE 1.48* 1.21* 1.00 1.09*  CALCIUM 8.3* 7.6* 8.1* 8.6*   Liver Function Tests: Recent Labs  Lab 09/13/21 1404 09/13/2021 2131  AST 22 21  ALT 12 16  ALKPHOS 57 72  BILITOT 1.2 0.9  PROT 5.8* 6.2*  ALBUMIN 2.5* 2.9*   No results for input(s): LIPASE, AMYLASE in the last 168 hours. No results  for input(s): AMMONIA in the last 168 hours. CBC: Recent Labs  Lab 09/13/21 1404 09/14/21 0527 09/14/21 2302 09/15/21 0439 09/16/21 0516 09/12/2021 2131  WBC 2.9* 2.5*  --  2.2* 3.0* 4.9  NEUTROABS 2.2  --   --   --   --  3.9  HGB 7.1* 6.7* 9.2* 8.4* 8.8* 9.7*  HCT 24.3* 22.2* 30.1* 26.7* 28.8* 33.1*  MCV 92.4 88.8  --  86.7 86.7 91.9  PLT 247 207  --  226 231 269   Cardiac Enzymes: No results for input(s): CKTOTAL, CKMB, CKMBINDEX, TROPONINI in the last 168 hours. Sepsis Labs: Recent Labs  Lab 09/13/21 1404 09/13/21 1654 09/14/21 0527 09/15/21 0439 09/16/21 0516 09/16/2021 2131  PROCALCITON  --   --  0.53  --   --   --   WBC 2.9*  --  2.5* 2.2* 3.0* 4.9  LATICACIDVEN 1.4 1.0   --   --   --   --     Procedures/Operations     Liron Eissler J [place] DO 09-24-2021, 10:42 AM

## 2021-09-22 NOTE — H&P (Addendum)
[place]   PATIENT NAME: Penny White    MR#:  [id number]  DATE OF BIRTH:  [birth year]  DATE OF ADMISSION:  09/17/2021  PRIMARY CARE PHYSICIAN: Leonel Ramsay, MD   Patient is coming from: Home  REQUESTING/REFERRING PHYSICIAN: Lurline Hare, MD  CHIEF COMPLAINT:   Chief Complaint  Patient presents with   Altered Mental Status    HISTORY OF PRESENT ILLNESS:  Penny White is a 87 y.o. Caucasian female with medical history significant for chronic diastolic dysfunction CHF with last known LVEF of 55%, stage IV chronic kidney disease, hypertension, hypothyroidism, dementia, marginal zone B cell lymphoma and permanent atrial fibrillation, who presented to the emergency room with acute onset of altered mental status with confusion with recent diagnosis of COVID-19 and admission for pneumonia from 12/23 - 09/16/2021.  The patient was having labored breathing and initially unable to provide any further history.  She has DNR/DNI status and was planning to call hospice to establish care which has not been established yet.  He desired to have her be on oxygen for comfort without any further escalation of care.    ED Course: When she came to the ER blood pressure was 155/59 with otherwise normal vital signs.  Labs revealed a glucose of 181 with a BUN of 26 and creatinine of 1.09 and calcium of 8.6.  Initial troponin I was 8. EKG showed atrial fibrillation with controlled ventricular sponsor of 86 with PVCs, low voltage QRS and poor R wave progression.3.  CBC showed anemia close to baseline  EKG as reviewed by me :  Imaging: Portable chest x-ray showed cardiomegaly with small bilateral pleural effusions with increased bilateral perihilar and basilar infiltrates.  The patient was ordered 40 mg of IV Lasix as well as Robinul and comfort measures were applied.  She will be admitted to a palliative care bed for comfort care and palliative care consult. PAST MEDICAL HISTORY:   Past  Medical History:  Diagnosis Date   Actinic keratosis    Arthritis    Atherosclerosis    Chronic heart failure with preserved ejection fraction (HFpEF) ([place])    a. 01/2020 Echo: EF >55%, mild LVH, nl RV fxn, Mild AI/TR, triv MR/PR; b. 07/2021 Echo: EF 60-65%, no rwma, nl RV fxn. Mildly dil LA. Mild MR/AI. Asc Ao 65mm.   CKD (chronic kidney disease), stage IV (HCC)    Dementia (HCC)    Dyspnea    Encephalitis 2020   Gout    Hyperlipemia    Hypertension    Hypothyroid    Marginal zone lymphoma (HCC)    MGUS (monoclonal gammopathy of unknown significance)    Nonobstructive Coronary artery disease    a. 2016 MV: very mild ant ischemia; b. 2019 MV: worsening ant ischemia; c 2019 Cath: nonobs dzs-->Med rx.   Permanent atrial fibrillation (HCC)    a. CHA2DS2VASc = 7-->eliquis.   Polymyalgia rheumatica (HCC)    Shingles 2020   Small cell B-cell lymphoma, unspecified site ([place])    Squamous cell carcinoma of skin    L arm removed by Dr Phillip Heal    Squamous cell carcinoma of skin 08/20/2021   R post shoulder - ED&C   Stroke ([place]) 2018   + 2021.  Peripheral vision damage.   Subdural hemorrhage ([place]) 2020    PAST SURGICAL HISTORY:   Past Surgical History:  Procedure Laterality Date   CARDIAC CATHETERIZATION     CATARACT EXTRACTION W/PHACO Left 12/04/2020   Procedure:  CATARACT EXTRACTION PHACO AND INTRAOCULAR LENS PLACEMENT (IOC) LEFT 6.95 00:45.9;  Surgeon: Birder Robson, MD;  Location: [facility];  Service: Ophthalmology;  Laterality: Left;   CATARACT EXTRACTION W/PHACO Right 12/18/2020   Procedure: CATARACT EXTRACTION PHACO AND INTRAOCULAR LENS PLACEMENT (IOC) RIGHT 7.07 00:42.9;  Surgeon: Birder Robson, MD;  Location: [facility];  Service: Ophthalmology;  Laterality: Right;   CHOLECYSTECTOMY  1978   COLONOSCOPY     LYMPH NODE BIOPSY N/A 05/17/2021   Procedure: RIGHT OCCIPITAL LYMPH NODE BIOPSY;  Surgeon: Robert Bellow, MD;  Location: [facility];  Service:  General;  Laterality: N/A;  occipital node biopsy possible axillary node biopsy   REPLACEMENT TOTAL KNEE Bilateral    Right 06/02/03,  left 04/20/06   TUBAL LIGATION      SOCIAL HISTORY:   Social History   Tobacco Use   Smoking status: Never   Smokeless tobacco: Never  Substance Use Topics   Alcohol use: Not Currently    Comment: occasional - wine small amount    FAMILY HISTORY:  History reviewed. No pertinent family history.  DRUG ALLERGIES:   Allergies  Allergen Reactions   Ciprofloxacin Itching   Novocain [Procaine]     Doesn't remember reaction    REVIEW OF SYSTEMS:   ROS As per history of present illness. All pertinent systems were reviewed above. Constitutional, HEENT, cardiovascular, respiratory, GI, GU, musculoskeletal, neuro, psychiatric, endocrine, integumentary and hematologic systems were reviewed and are otherwise negative/unremarkable except for positive findings mentioned above in the HPI.   MEDICATIONS AT HOME:   Prior to Admission medications   Medication Sig Start Date End Date Taking? Authorizing Provider  acetaminophen (TYLENOL) 500 MG tablet Take 1 tablet (500 mg total) by mouth every 4 (four) hours as needed for moderate pain. Patient taking differently: Take 500 mg by mouth every 6 (six) hours as needed for moderate pain. 05/17/21   Robert Bellow, MD  albuterol (VENTOLIN HFA) 108 (90 Base) MCG/ACT inhaler Inhale 2 puffs into the lungs every 6 (six) hours. 09/16/21   Lorella Nimrod, MD  amoxicillin-clavulanate (AUGMENTIN) 875-125 MG tablet Take 1 tablet by mouth 2 (two) times daily for 3 days. 09/16/21 10-04-2021  Lorella Nimrod, MD  apixaban (ELIQUIS) 2.5 MG TABS tablet Take 2 tablets (5 mg total) by mouth 2 (two) times daily. 08/03/21   Richarda Osmond, MD  ascorbic acid (VITAMIN C) 500 MG tablet Take 1 tablet (500 mg total) by mouth daily. 09/17/21   Lorella Nimrod, MD  carboxymethylcellulose (REFRESH PLUS) 0.5 % SOLN Place 1 drop into both eyes  3 (three) times daily as needed (dry eyes).    [provider]  ferrous sulfate 325 (65 FE) MG tablet Take 325 mg by mouth every other day.    [provider]  guaiFENesin-dextromethorphan (ROBITUSSIN DM) 100-10 MG/5ML syrup Take 10 mLs by mouth every 4 (four) hours as needed for cough. 09/16/21   Lorella Nimrod, MD  levothyroxine (SYNTHROID) 50 MCG tablet Take 50 mcg by mouth daily before breakfast.    [provider]  Multiple Vitamins-Iron (MULTIVITAMIN/IRON PO) Take 1 tablet by mouth daily.    [provider]  predniSONE (DELTASONE) 5 MG tablet Take 1 tablet (5 mg total) by mouth daily with breakfast. Resume home dose after finishing higher doses of prednisone 09/16/21   Lorella Nimrod, MD  predniSONE (DELTASONE) 50 MG tablet Take 1 tablet daily for 5 days and then resume your home dose 09/17/21   Lorella Nimrod, MD  senna (SENOKOT) 8.6 MG tablet Take 1 tablet (8.6 mg total) by mouth 2 (two) times daily as needed for constipation. 08/03/21   Richarda Osmond, MD  zinc sulfate 220 (50 Zn) MG capsule Take 1 capsule (220 mg total) by mouth daily. 09/17/21   Lorella Nimrod, MD      VITAL SIGNS:  Blood pressure (!) 130/57, pulse 70, temperature 99.6 F (37.6 C), temperature source Oral, resp. rate (!) 22, height 5\' 2"  (1.575 m), weight 85.4 kg, SpO2 96 %.  PHYSICAL EXAMINATION:  Physical Exam  GENERAL:  86 y.o.-year-old Caucasian female patient lying in the bed with mild respiratory distress.  She was opening her eyes to her name but was nonverbal. EYES: Pupils equal, round, reactive to light and accommodation. No scleral icterus. Extraocular muscles intact.  HEENT: Head atraumatic, normocephalic. Oropharynx and nasopharynx clear.  NECK:  Supple, no jugular venous distention. No thyroid enlargement, no tenderness.  LUNGS:Diminished bibasilar breath sounds with bibasal rales. No use of accessory muscles of respiration.  CARDIOVASCULAR: Regular rate and rhythm,  S1, S2 normal. No murmurs, rubs, or gallops.  ABDOMEN: Soft, nondistended, nontender. Bowel sounds present. No organomegaly or mass.  EXTREMITIES: Bilateral pedal and leg 2+ pitting edema, with no cyanosis, or clubbing.  NEUROLOGIC: Grossly nonfocal.   PSYCHIATRIC: The patient is very somnolent but arousable.   SKIN: No obvious rash, lesion, or ulcer.   LABORATORY PANEL:   CBC Recent Labs  Lab 08/29/2021 2131  WBC 4.9  HGB 9.7*  HCT 33.1*  PLT 269   ------------------------------------------------------------------------------------------------------------------  Chemistries  Recent Labs  Lab 09/01/2021 2131  NA 143  K 3.6  CL 108  CO2 26  GLUCOSE 181*  BUN 26*  CREATININE 1.09*  CALCIUM 8.6*  AST 21  ALT 16  ALKPHOS 72  BILITOT 0.9   ------------------------------------------------------------------------------------------------------------------  Cardiac Enzymes No results for input(s): TROPONINI in the last 168 hours. ------------------------------------------------------------------------------------------------------------------  RADIOLOGY:  CT HEAD WO CONTRAST (5MM)  Result Date: 09/03/2021 CLINICAL DATA:  Mental status change EXAM: CT HEAD WITHOUT CONTRAST TECHNIQUE: Contiguous axial images were obtained from the base of the skull through the vertex without intravenous contrast. COMPARISON:  CT brain 12/28/2019 FINDINGS: Brain: No acute territorial infarction, hemorrhage, or intracranial mass. Encephalomalacia within the left greater than right occipital lobes consistent with chronic infarcts, right occipital infarct is new since the CT from 2021. Moderate atrophy. Mild chronic small vessel ischemic changes of the white matter. Stable ventricle size. Vascular: No hyperdense vessels.  Carotid vascular calcification Skull: Normal. Negative for fracture or focal lesion. Sinuses/Orbits: No acute finding. Other: None IMPRESSION: 1. No definite CT evidence for acute  intracranial abnormality. 2. Encephalomalacia within the left greater than right occipital lobes consistent with chronic infarcts. Infarct in the right occipital lobe is new as compared with 2021 CT. 3. Atrophy and chronic small vessel ischemic changes of the white matter Electronically Signed   By: Donavan Foil M.D.   On: 08/25/2021 22:38   DG Chest Portable 1 View  Result Date: 08/26/2021 CLINICAL DATA:  Respiratory distress EXAM: PORTABLE CHEST 1 VIEW COMPARISON:  09/13/2021 FINDINGS: Cardiac enlargement. Increasing patchy bilateral perihilar and basilar infiltrates. Changes could represent edema or multifocal pneumonia. Small pleural effusions. No pneumothorax. Mediastinal contours appear intact. IMPRESSION: Cardiac enlargement with small bilateral pleural effusions. Increasing bilateral perihilar and basilar infiltrates. Electronically Signed   By: Lucienne Capers M.D.   On: 09/15/2021 22:40      IMPRESSION AND PLAN:  Principal Problem:   Acute  hypoxemic respiratory failure due to COVID-19 [facility]) Active Problems:   Pneumonia due to COVID-19 virus  1.  Acute hypoxemic respiratory failure due to worsening COVID-19 pneumonia with associated acute metabolic encephalopathy with history significant for chronic diastolic dysfunction CHF with last known LVEF of 55%, stage IV chronic kidney disease, hypertension, hypothyroidism, dementia, marginal zone B cell lymphoma and permanent atrial fibrillation. - The patient will be admitted to a palliative care bed. - Comfort measures will be evaluated. - Palliative care consult will be obtained. - O2 protocol will be followed for comfort.  DVT prophylaxis: Not indicated. Code Status: DNR/DNI.   Family Communication:  The plan of care was discussed in details with the patient (and family). I answered all questions. The patient agreed to proceed with the above mentioned plan. Further management will depend upon hospital course. Disposition Plan: Back to  previous home environment Consults called: Palliative care. All the records are reviewed and case discussed with ED provider.  Status is: Inpatient  At the time of the admission, it appears that the appropriate admission status for this patient is inpatient.  This is judged to be reasonable and necessary in order to provide the required intensity of service to ensure the patient's safety given the presenting symptoms, physical exam findings and initial radiographic and laboratory data in the context of comorbid conditions.  The patient requires inpatient status due to high intensity of service, high risk of further deterioration and high frequency of surveillance required.  I certify that at the time of admission, it is my clinical judgment that the patient will require inpatient hospital care extending more than 2 midnights.                            Dispo: The patient is from: Home              Anticipated d/c is to: Home/hospice              Patient currently is not medically stable to d/c.              Difficult to place patient: No   Christel Mormon M.D on 10-14-2021 at 2:37 AM  [facility]   From 7 PM-7 AM, contact night-coverage [email or web address]  CC: Primary care physician; Leonel Ramsay, MD

## 2021-09-22 NOTE — ED Notes (Signed)
Pt removing O2 and will not consent to IV or blood draws. Pt's son at bedside, requesting to speak with provider about comfort care for his mother.

## 2021-09-22 NOTE — ED Notes (Signed)
Patient expired with family at bedside.

## 2021-09-22 NOTE — ED Notes (Signed)
Pt resting comfortably, maintaining O2 sats on [place].

## 2021-09-22 NOTE — ED Notes (Signed)
Patient continues to be unable to verbally respond. Son is back at bedside. Discussed progression of care with son. Patient's O2 sat was 54% upon this RN's arrival this am (patient had removed her nasal cannula). Patient did not seem to be in distress, but O2 cannula was returned to patient for comfort. O2 removed per son's wishes, will monitor patient's tolerance and signs of distress.

## 2021-09-22 NOTE — Progress Notes (Signed)
Chaplain Maggie offered grief and bereavement care at bedside shortly after patient's death. Her sons were at bedside. Storytelling was central to this visit. Pt's Catholic faith and peaceful passing gave the family consolation. The celtic prayer Walking with Grief was shared with her son Marcello Moores. It encourages gentleness and calm in the wake of loss.

## 2021-09-22 NOTE — ED Notes (Signed)
Pt continues to refuse lab draws or IV access.

## 2021-09-22 NOTE — ED Notes (Signed)
Pt continues given medication IM as continues to refuse IV.  Pt's sons at bedside.

## 2021-09-22 NOTE — Telephone Encounter (Signed)
I was going to send the order for hospice to home place but when I went to get the print out , pt was put in computer that she had passed away. So I did not send it.

## 2021-09-22 DEATH — deceased

## 2021-10-15 ENCOUNTER — Ambulatory Visit: Payer: Medicare HMO | Admitting: Oncology

## 2021-10-15 ENCOUNTER — Other Ambulatory Visit: Payer: Medicare HMO

## 2022-09-14 IMAGING — CT CT RENAL STONE PROTOCOL
2 of 4 series · 15 of 46 positions shown, 17 images · non-contrast
Comparison: 12/20/2020

CLINICAL DATA: Hematuria

EXAM:
CT ABDOMEN AND PELVIS WITHOUT CONTRAST
TECHNIQUE: Multidetector CT imaging of the abdomen and pelvis was performed
following the standard protocol without IV contrast. Unenhanced CT
was performed per clinician order. Lack of IV contrast limits
sensitivity and specificity, especially for evaluation of
abdominal/pelvic solid viscera.

[Series 2: stone full standard · axial · 0.79mm/px · z∈[-471,-41]mm · 12 of 94 slices shown, 14 images]
[im 4/94  soft-tissue]
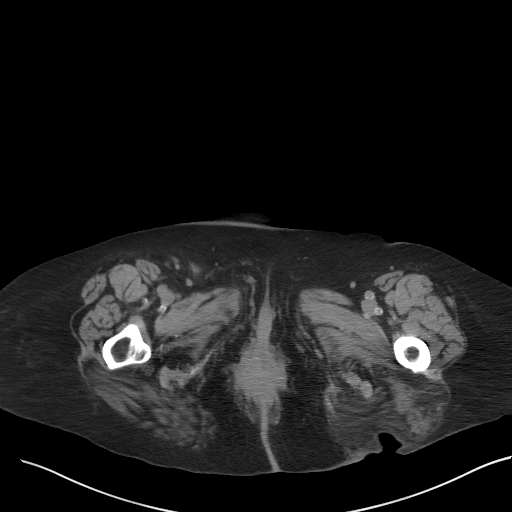
[im 4/94  bone]
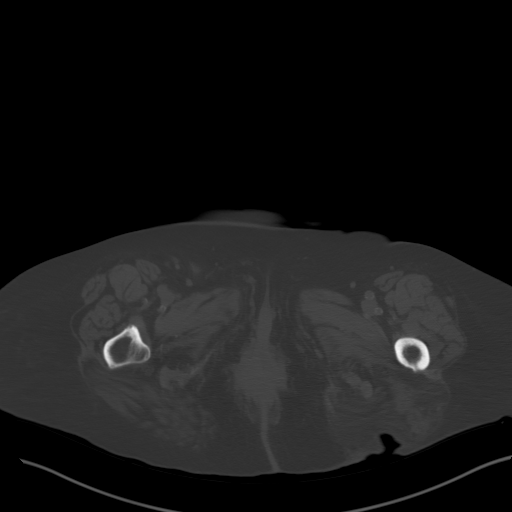
[im 12/94  soft-tissue]
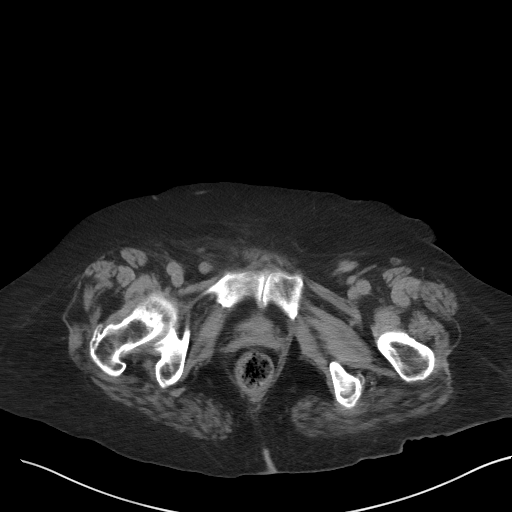
[im 20/94  soft-tissue]
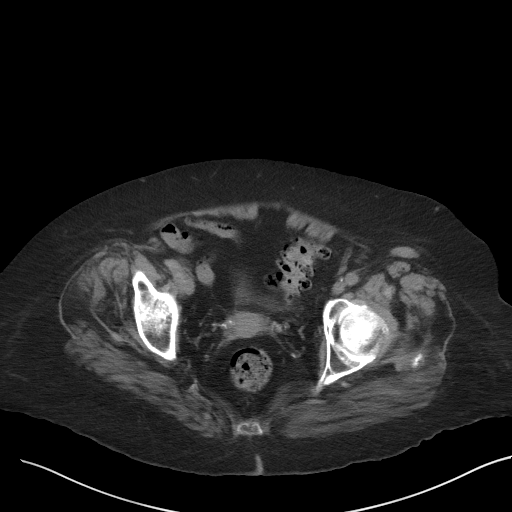
[im 28/94  soft-tissue]
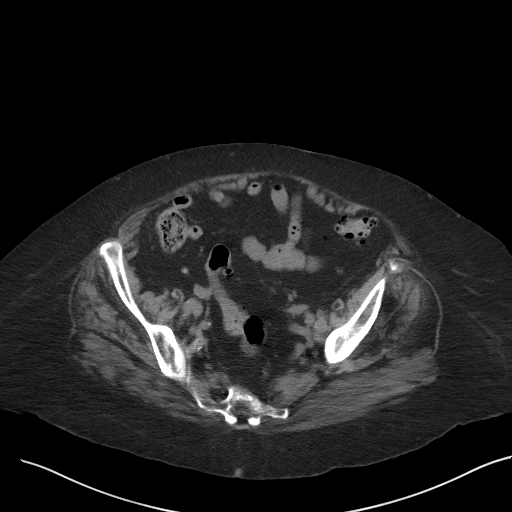
[im 35/94  soft-tissue]
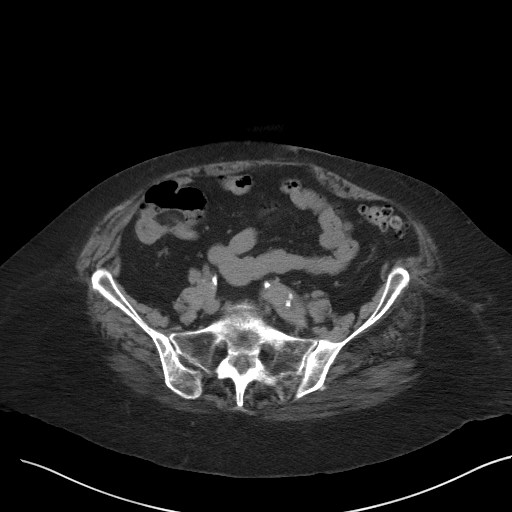
[im 43/94  soft-tissue]
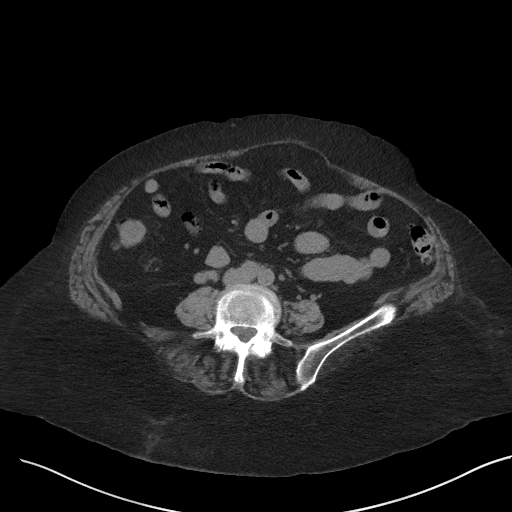
[im 51/94  soft-tissue]
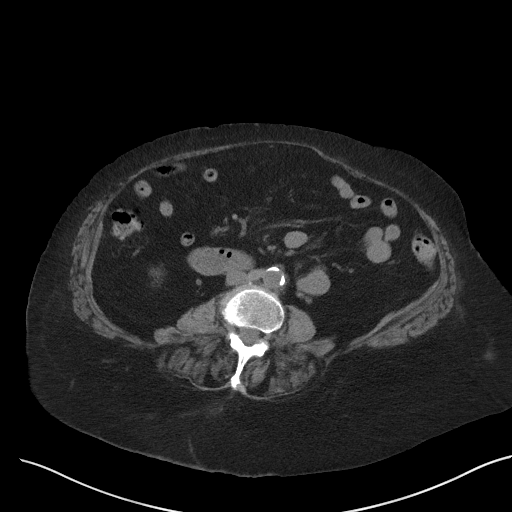
[im 59/94  soft-tissue]
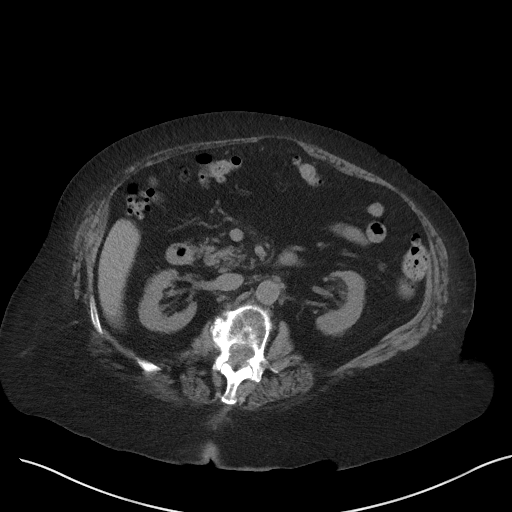
[im 66/94  soft-tissue]
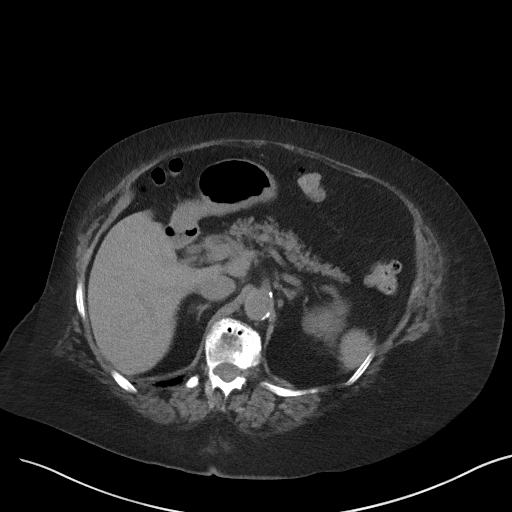
[im 66/94  bone]
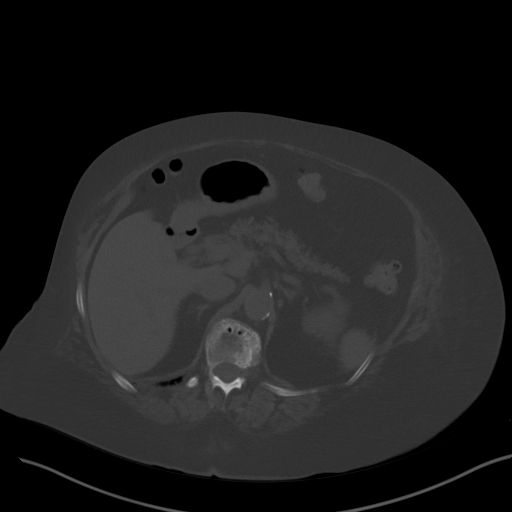
[im 74/94  soft-tissue]
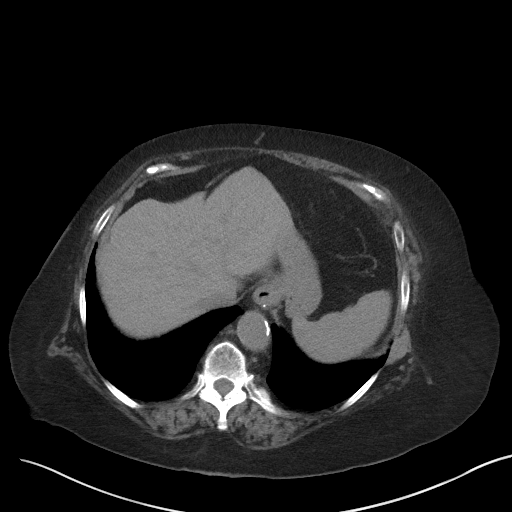
[im 82/94  soft-tissue]
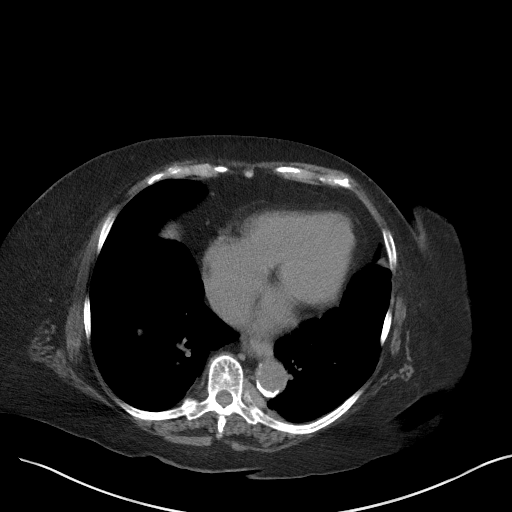
[im 90/94  soft-tissue]
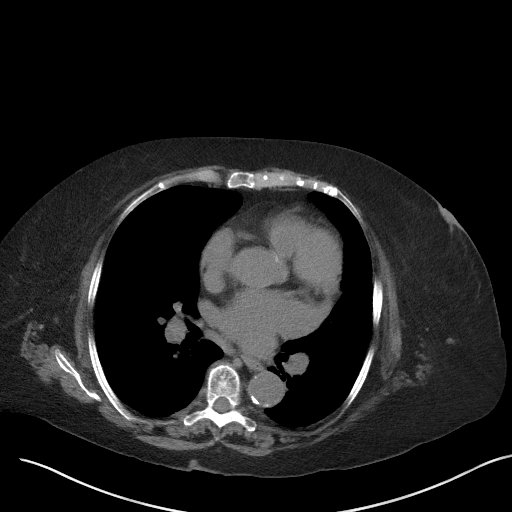

[Series 5: coronal · coronal · 0.62mm/px · 3 of 148 slices shown]
[im 50/148  soft-tissue]
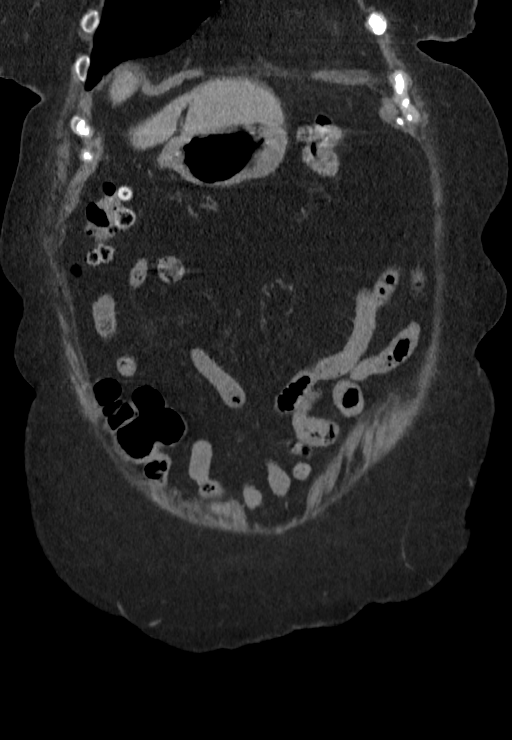
[im 66/148  soft-tissue]
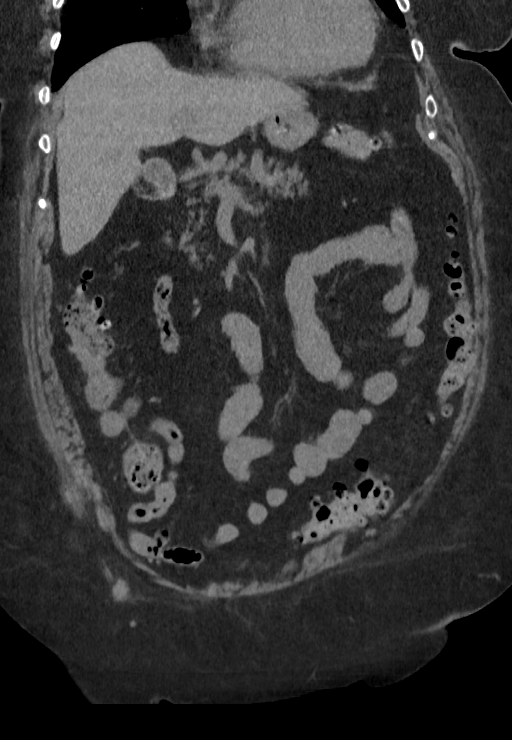
[im 82/148  soft-tissue]
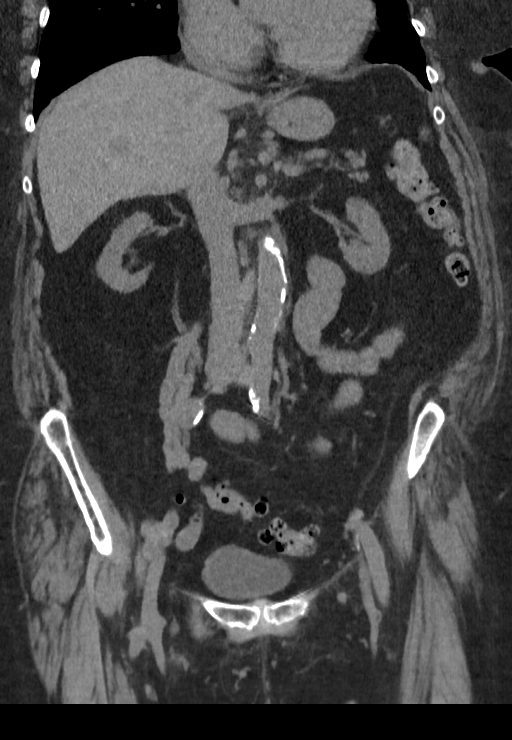

[15 of 46 positions shown; findings below may reference images not displayed]

FINDINGS: Lower chest: No acute pleural or parenchymal lung disease. Stable
scattered areas of subpleural scarring at the lung bases.

Hepatobiliary: No focal liver abnormality is seen. Status post
cholecystectomy. No biliary dilatation.

Pancreas: Unremarkable. No pancreatic ductal dilatation or
surrounding inflammatory changes.

Spleen: Stable indeterminate hypodensities within the spleen,
measuring up to 14 mm. No evidence of splenomegaly.

Adrenals/Urinary Tract: No urinary tract calculi or obstructive
uropathy. The adrenals and bladder are grossly unremarkable.

Stomach/Bowel: No bowel obstruction or ileus. There is diffuse
colonic diverticulosis without evidence of diverticulitis. Normal
appendix right lower quadrant.

Vascular/Lymphatic: Diffuse atherosclerosis throughout the abdominal
aorta and its branches. There is a single borderline enlarged right
inguinal lymph node measuring 2.1 x 1.4 cm reference image 89/2.
Numerous other subcentimeter lymph nodes are seen within the
bilateral inguinal regions and retroperitoneum, nonspecific. No
other pathologic adenopathy.

Reproductive: Uterus and bilateral adnexa are unremarkable.

Other: No free fluid or free gas. Small fat containing umbilical
hernia. No bowel herniation.

Musculoskeletal: No acute or destructive bony lesions. Reconstructed
images demonstrate no additional findings.
IMPRESSION: 1. No urinary tract calculi or obstructive uropathy. No CT etiology
to explain hematuria.
2. Colonic diverticulosis without diverticulitis.
3. Borderline enlarged right inguinal lymph node, nonspecific. No
other pathologic adenopathy.
4. Stable indeterminate splenic hypodensities, statistically likely
benign hemangioma or cyst.
5.  Aortic Atherosclerosis ([id number].F).
